# Patient Record
Sex: Female | Born: 1955
Health system: Southern US, Community
[De-identification: ages and names within clinical notes are randomized; demographics above are authoritative.]

## PROBLEM LIST (undated history)

## (undated) DIAGNOSIS — I071 Rheumatic tricuspid insufficiency: Secondary | ICD-10-CM

## (undated) DIAGNOSIS — F419 Anxiety disorder, unspecified: Secondary | ICD-10-CM

## (undated) DIAGNOSIS — M810 Age-related osteoporosis without current pathological fracture: Secondary | ICD-10-CM

## (undated) DIAGNOSIS — Z954 Presence of other heart-valve replacement: Secondary | ICD-10-CM

## (undated) DIAGNOSIS — K219 Gastro-esophageal reflux disease without esophagitis: Secondary | ICD-10-CM

## (undated) DIAGNOSIS — Z1211 Encounter for screening for malignant neoplasm of colon: Secondary | ICD-10-CM

## (undated) DIAGNOSIS — Z8679 Personal history of other diseases of the circulatory system: Secondary | ICD-10-CM

## (undated) DIAGNOSIS — F32A Depression, unspecified: Secondary | ICD-10-CM

## (undated) DIAGNOSIS — K589 Irritable bowel syndrome without diarrhea: Secondary | ICD-10-CM

## (undated) DIAGNOSIS — E785 Hyperlipidemia, unspecified: Secondary | ICD-10-CM

## (undated) DIAGNOSIS — F329 Major depressive disorder, single episode, unspecified: Secondary | ICD-10-CM

## (undated) DIAGNOSIS — I051 Rheumatic mitral insufficiency: Secondary | ICD-10-CM

## (undated) DIAGNOSIS — E119 Type 2 diabetes mellitus without complications: Secondary | ICD-10-CM

## (undated) DIAGNOSIS — Z7901 Long term (current) use of anticoagulants: Secondary | ICD-10-CM

## (undated) DIAGNOSIS — I050 Rheumatic mitral stenosis: Secondary | ICD-10-CM

## (undated) DIAGNOSIS — R195 Other fecal abnormalities: Secondary | ICD-10-CM

## (undated) DIAGNOSIS — I480 Paroxysmal atrial fibrillation: Secondary | ICD-10-CM

## (undated) DIAGNOSIS — I509 Heart failure, unspecified: Secondary | ICD-10-CM

## (undated) DIAGNOSIS — I099 Rheumatic heart disease, unspecified: Secondary | ICD-10-CM

## (undated) HISTORY — DX: Depression, unspecified: F32.A

## (undated) HISTORY — DX: Other fecal abnormalities: R19.5

## (undated) HISTORY — DX: Long term (current) use of anticoagulants: Z79.01

## (undated) HISTORY — DX: Age-related osteoporosis without current pathological fracture: M81.0

## (undated) HISTORY — DX: Encounter for screening for malignant neoplasm of colon: Z12.11

## (undated) HISTORY — DX: Anxiety disorder, unspecified: F41.9

## (undated) HISTORY — PX: NO PAST SURGERIES: SHX2092

## (undated) HISTORY — DX: Rheumatic heart disease, unspecified: I09.9

## (undated) HISTORY — DX: Hyperlipidemia, unspecified: E78.5

## (undated) HISTORY — DX: Rheumatic tricuspid insufficiency: I07.1

## (undated) HISTORY — DX: Type 2 diabetes mellitus without complications: E11.9

## (undated) HISTORY — DX: Irritable bowel syndrome, unspecified: K58.9

## (undated) HISTORY — PX: WISDOM TOOTH EXTRACTION: SHX21

---
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
## 1999-11-12 ENCOUNTER — Other Ambulatory Visit: Admission: RE | Admit: 1999-11-12 | Discharge: 1999-11-12 | Payer: Self-pay | Admitting: Obstetrics & Gynecology

## 2000-11-03 ENCOUNTER — Other Ambulatory Visit: Admission: RE | Admit: 2000-11-03 | Discharge: 2000-11-03 | Payer: Self-pay | Admitting: Obstetrics & Gynecology

## 2001-11-16 ENCOUNTER — Other Ambulatory Visit: Admission: RE | Admit: 2001-11-16 | Discharge: 2001-11-16 | Payer: Self-pay | Admitting: Obstetrics & Gynecology

## 2002-11-24 ENCOUNTER — Other Ambulatory Visit: Admission: RE | Admit: 2002-11-24 | Discharge: 2002-11-24 | Payer: Self-pay | Admitting: Obstetrics & Gynecology

## 2003-12-14 ENCOUNTER — Other Ambulatory Visit: Admission: RE | Admit: 2003-12-14 | Discharge: 2003-12-14 | Payer: Self-pay | Admitting: Obstetrics & Gynecology

## 2005-01-09 ENCOUNTER — Other Ambulatory Visit: Admission: RE | Admit: 2005-01-09 | Discharge: 2005-01-09 | Payer: Self-pay | Admitting: Obstetrics & Gynecology

## 2008-06-14 HISTORY — PX: COLONOSCOPY: SHX174

## 2011-05-18 HISTORY — PX: ESOPHAGOGASTRODUODENOSCOPY: SHX1529

## 2015-10-27 DIAGNOSIS — I509 Heart failure, unspecified: Secondary | ICD-10-CM | POA: Insufficient documentation

## 2015-10-27 HISTORY — DX: Heart failure, unspecified: I50.9

## 2016-01-20 DIAGNOSIS — Z7901 Long term (current) use of anticoagulants: Secondary | ICD-10-CM | POA: Insufficient documentation

## 2016-01-20 DIAGNOSIS — I051 Rheumatic mitral insufficiency: Secondary | ICD-10-CM

## 2016-01-20 DIAGNOSIS — I050 Rheumatic mitral stenosis: Secondary | ICD-10-CM

## 2016-01-20 DIAGNOSIS — I480 Paroxysmal atrial fibrillation: Secondary | ICD-10-CM

## 2016-01-20 DIAGNOSIS — I099 Rheumatic heart disease, unspecified: Secondary | ICD-10-CM

## 2016-01-20 HISTORY — DX: Rheumatic mitral stenosis: I05.0

## 2016-01-20 HISTORY — DX: Long term (current) use of anticoagulants: Z79.01

## 2016-01-20 HISTORY — DX: Paroxysmal atrial fibrillation: I48.0

## 2016-01-20 HISTORY — DX: Rheumatic mitral insufficiency: I05.1

## 2016-01-27 DIAGNOSIS — I4891 Unspecified atrial fibrillation: Secondary | ICD-10-CM | POA: Diagnosis not present

## 2016-02-04 DIAGNOSIS — I4891 Unspecified atrial fibrillation: Secondary | ICD-10-CM | POA: Diagnosis not present

## 2016-02-25 DIAGNOSIS — I4891 Unspecified atrial fibrillation: Secondary | ICD-10-CM | POA: Diagnosis not present

## 2016-02-25 DIAGNOSIS — Z7901 Long term (current) use of anticoagulants: Secondary | ICD-10-CM | POA: Diagnosis not present

## 2016-03-04 DIAGNOSIS — I4891 Unspecified atrial fibrillation: Secondary | ICD-10-CM | POA: Diagnosis not present

## 2016-03-18 DIAGNOSIS — I480 Paroxysmal atrial fibrillation: Secondary | ICD-10-CM | POA: Diagnosis not present

## 2016-03-18 DIAGNOSIS — Z7901 Long term (current) use of anticoagulants: Secondary | ICD-10-CM | POA: Diagnosis not present

## 2016-04-08 DIAGNOSIS — I099 Rheumatic heart disease, unspecified: Secondary | ICD-10-CM | POA: Diagnosis not present

## 2016-04-08 DIAGNOSIS — I480 Paroxysmal atrial fibrillation: Secondary | ICD-10-CM | POA: Diagnosis not present

## 2016-04-08 DIAGNOSIS — I050 Rheumatic mitral stenosis: Secondary | ICD-10-CM | POA: Diagnosis not present

## 2016-04-08 DIAGNOSIS — I051 Rheumatic mitral insufficiency: Secondary | ICD-10-CM | POA: Diagnosis not present

## 2016-04-08 DIAGNOSIS — I4891 Unspecified atrial fibrillation: Secondary | ICD-10-CM | POA: Diagnosis not present

## 2016-04-21 DIAGNOSIS — K219 Gastro-esophageal reflux disease without esophagitis: Secondary | ICD-10-CM | POA: Diagnosis not present

## 2016-04-22 DIAGNOSIS — I4891 Unspecified atrial fibrillation: Secondary | ICD-10-CM | POA: Diagnosis not present

## 2016-04-27 DIAGNOSIS — Z1231 Encounter for screening mammogram for malignant neoplasm of breast: Secondary | ICD-10-CM | POA: Diagnosis not present

## 2016-05-07 DIAGNOSIS — I4891 Unspecified atrial fibrillation: Secondary | ICD-10-CM | POA: Diagnosis not present

## 2016-05-12 DIAGNOSIS — Z124 Encounter for screening for malignant neoplasm of cervix: Secondary | ICD-10-CM | POA: Diagnosis not present

## 2016-05-12 DIAGNOSIS — Z6831 Body mass index (BMI) 31.0-31.9, adult: Secondary | ICD-10-CM | POA: Diagnosis not present

## 2016-05-12 DIAGNOSIS — Z01419 Encounter for gynecological examination (general) (routine) without abnormal findings: Secondary | ICD-10-CM | POA: Diagnosis not present

## 2016-05-28 DIAGNOSIS — I4891 Unspecified atrial fibrillation: Secondary | ICD-10-CM | POA: Diagnosis not present

## 2016-06-25 DIAGNOSIS — I480 Paroxysmal atrial fibrillation: Secondary | ICD-10-CM | POA: Diagnosis not present

## 2016-07-02 DIAGNOSIS — I4891 Unspecified atrial fibrillation: Secondary | ICD-10-CM | POA: Diagnosis not present

## 2016-07-14 DIAGNOSIS — I099 Rheumatic heart disease, unspecified: Secondary | ICD-10-CM | POA: Diagnosis not present

## 2016-07-14 DIAGNOSIS — I050 Rheumatic mitral stenosis: Secondary | ICD-10-CM | POA: Diagnosis not present

## 2016-07-14 DIAGNOSIS — I051 Rheumatic mitral insufficiency: Secondary | ICD-10-CM | POA: Diagnosis not present

## 2016-07-14 DIAGNOSIS — I480 Paroxysmal atrial fibrillation: Secondary | ICD-10-CM | POA: Diagnosis not present

## 2016-07-21 DIAGNOSIS — Z0000 Encounter for general adult medical examination without abnormal findings: Secondary | ICD-10-CM | POA: Diagnosis not present

## 2016-07-21 DIAGNOSIS — I4891 Unspecified atrial fibrillation: Secondary | ICD-10-CM | POA: Diagnosis not present

## 2016-07-21 DIAGNOSIS — I5030 Unspecified diastolic (congestive) heart failure: Secondary | ICD-10-CM | POA: Diagnosis not present

## 2016-07-21 DIAGNOSIS — I050 Rheumatic mitral stenosis: Secondary | ICD-10-CM | POA: Diagnosis not present

## 2016-08-04 DIAGNOSIS — I4891 Unspecified atrial fibrillation: Secondary | ICD-10-CM | POA: Diagnosis not present

## 2016-08-18 DIAGNOSIS — Z23 Encounter for immunization: Secondary | ICD-10-CM | POA: Diagnosis not present

## 2016-09-02 DIAGNOSIS — I4891 Unspecified atrial fibrillation: Secondary | ICD-10-CM | POA: Diagnosis not present

## 2016-09-16 DIAGNOSIS — Z7901 Long term (current) use of anticoagulants: Secondary | ICD-10-CM | POA: Diagnosis not present

## 2016-09-16 DIAGNOSIS — I480 Paroxysmal atrial fibrillation: Secondary | ICD-10-CM | POA: Diagnosis not present

## 2016-09-30 DIAGNOSIS — I050 Rheumatic mitral stenosis: Secondary | ICD-10-CM | POA: Diagnosis not present

## 2016-09-30 DIAGNOSIS — E119 Type 2 diabetes mellitus without complications: Secondary | ICD-10-CM | POA: Diagnosis not present

## 2016-09-30 DIAGNOSIS — I5030 Unspecified diastolic (congestive) heart failure: Secondary | ICD-10-CM | POA: Diagnosis not present

## 2016-09-30 DIAGNOSIS — I4891 Unspecified atrial fibrillation: Secondary | ICD-10-CM | POA: Diagnosis not present

## 2016-09-30 DIAGNOSIS — I480 Paroxysmal atrial fibrillation: Secondary | ICD-10-CM | POA: Diagnosis not present

## 2016-10-29 DIAGNOSIS — H5203 Hypermetropia, bilateral: Secondary | ICD-10-CM | POA: Diagnosis not present

## 2016-10-29 DIAGNOSIS — I480 Paroxysmal atrial fibrillation: Secondary | ICD-10-CM | POA: Diagnosis not present

## 2016-11-10 DIAGNOSIS — I4891 Unspecified atrial fibrillation: Secondary | ICD-10-CM | POA: Diagnosis not present

## 2016-12-09 DIAGNOSIS — I480 Paroxysmal atrial fibrillation: Secondary | ICD-10-CM | POA: Diagnosis not present

## 2016-12-24 DIAGNOSIS — I480 Paroxysmal atrial fibrillation: Secondary | ICD-10-CM | POA: Diagnosis not present

## 2017-01-07 DIAGNOSIS — I480 Paroxysmal atrial fibrillation: Secondary | ICD-10-CM | POA: Diagnosis not present

## 2017-01-29 DIAGNOSIS — E785 Hyperlipidemia, unspecified: Secondary | ICD-10-CM | POA: Diagnosis not present

## 2017-01-29 DIAGNOSIS — E1169 Type 2 diabetes mellitus with other specified complication: Secondary | ICD-10-CM | POA: Diagnosis not present

## 2017-01-29 DIAGNOSIS — G4733 Obstructive sleep apnea (adult) (pediatric): Secondary | ICD-10-CM | POA: Diagnosis not present

## 2017-01-29 DIAGNOSIS — I4891 Unspecified atrial fibrillation: Secondary | ICD-10-CM | POA: Diagnosis not present

## 2017-01-29 DIAGNOSIS — I5030 Unspecified diastolic (congestive) heart failure: Secondary | ICD-10-CM | POA: Diagnosis not present

## 2017-01-29 DIAGNOSIS — I050 Rheumatic mitral stenosis: Secondary | ICD-10-CM | POA: Diagnosis not present

## 2017-02-24 DIAGNOSIS — I480 Paroxysmal atrial fibrillation: Secondary | ICD-10-CM | POA: Diagnosis not present

## 2017-03-08 DIAGNOSIS — I4891 Unspecified atrial fibrillation: Secondary | ICD-10-CM | POA: Diagnosis not present

## 2017-03-09 DIAGNOSIS — J189 Pneumonia, unspecified organism: Secondary | ICD-10-CM | POA: Diagnosis not present

## 2017-03-09 DIAGNOSIS — Z6827 Body mass index (BMI) 27.0-27.9, adult: Secondary | ICD-10-CM | POA: Diagnosis not present

## 2017-03-11 DIAGNOSIS — Z7901 Long term (current) use of anticoagulants: Secondary | ICD-10-CM | POA: Diagnosis not present

## 2017-03-11 DIAGNOSIS — I480 Paroxysmal atrial fibrillation: Secondary | ICD-10-CM | POA: Diagnosis not present

## 2017-03-15 DIAGNOSIS — I482 Chronic atrial fibrillation: Secondary | ICD-10-CM | POA: Diagnosis not present

## 2017-03-29 DIAGNOSIS — I480 Paroxysmal atrial fibrillation: Secondary | ICD-10-CM | POA: Diagnosis not present

## 2017-03-29 DIAGNOSIS — Z7901 Long term (current) use of anticoagulants: Secondary | ICD-10-CM | POA: Diagnosis not present

## 2017-04-29 ENCOUNTER — Encounter: Payer: Self-pay | Admitting: Cardiology

## 2017-04-29 ENCOUNTER — Ambulatory Visit (INDEPENDENT_AMBULATORY_CARE_PROVIDER_SITE_OTHER): Payer: BLUE CROSS/BLUE SHIELD | Admitting: Cardiology

## 2017-04-29 ENCOUNTER — Encounter (INDEPENDENT_AMBULATORY_CARE_PROVIDER_SITE_OTHER): Payer: Self-pay

## 2017-04-29 VITALS — BP 120/56 | HR 74 | Resp 10 | Ht 64.0 in | Wt 154.0 lb

## 2017-04-29 DIAGNOSIS — Z7901 Long term (current) use of anticoagulants: Secondary | ICD-10-CM | POA: Diagnosis not present

## 2017-04-29 DIAGNOSIS — I099 Rheumatic heart disease, unspecified: Secondary | ICD-10-CM | POA: Insufficient documentation

## 2017-04-29 DIAGNOSIS — I480 Paroxysmal atrial fibrillation: Secondary | ICD-10-CM | POA: Diagnosis not present

## 2017-04-29 DIAGNOSIS — I050 Rheumatic mitral stenosis: Secondary | ICD-10-CM | POA: Insufficient documentation

## 2017-04-29 DIAGNOSIS — I051 Rheumatic mitral insufficiency: Secondary | ICD-10-CM | POA: Insufficient documentation

## 2017-04-29 NOTE — Progress Notes (Signed)
Cardiology Office Note:    Date:  04/29/2017   ID:  Melody Torres, DOB 05/30/1956, MRN [id number]  PCP:  Street, Stephanie Coup, MD  Cardiologist:  Gypsy Balsam, MD    Referring MD: Street, Stephanie Coup, *   Chief Complaint  Patient presents with  . Follow-up  Mitral stenosis secondary to rheumatic heart disease  History of Present Illness:    Melody Torres is a 61 y.o. female  with traumatic stenosis. Doing fine. She used to dense on the radial basis but lately because of some family members being sick she simply doesn't do it but denies having any shortness of breath, palpitations, chest pain, dizziness, passing of the overall she seems to be doing well.  Past Medical History:  Diagnosis Date  . Diabetes mellitus without complication (HCC)   . Hyperlipidemia     Past Surgical History:  Procedure Laterality Date  . NO PAST SURGERIES      Current Medications: Current Meds  Medication Sig  . digoxin (DIGOX) 0.25 MG tablet Take 1 tablet by mouth daily.  Marland Kitchen dronedarone (MULTAQ) 400 MG tablet Take 1 tablet by mouth 2 (two) times daily.  Marland Kitchen JANUVIA 100 MG tablet Take 1 tablet by mouth daily.  Marland Kitchen omeprazole (PRILOSEC) 20 MG capsule Take 20 mg by mouth daily.  . pravastatin (PRAVACHOL) 40 MG tablet Take 1 tablet by mouth daily with supper.  . warfarin (COUMADIN) 5 MG tablet TAKE 2 TABLETS BY MOUTH EVERY DAY     Allergies:   Penicillins   Social History   Social History  . Marital status: Married    Spouse name: N/A  . Number of children: N/A  . Years of education: N/A   Social History Main Topics  . Smoking status: Never Smoker  . Smokeless tobacco: Never Used  . Alcohol use No  . Drug use: No  . Sexual activity: Not Asked   Other Topics Concern  . None   Social History Narrative  . None     Family History: The patient's family history includes Bladder Cancer in her mother; Diabetes in her father; Heart attack in her mother; Heart disease in her  father; Hypothyroidism in her mother; Stroke in her father. ROS:   Please see the history of present illness.     All other systems reviewed and are negative.  EKGs/Labs/Other Studies Reviewed:      Recent Labs: No results found for requested labs within last 8760 hours.  Recent Lipid Panel No results found for: CHOL, TRIG, HDL, CHOLHDL, VLDL, LDLCALC, LDLDIRECT  Physical Exam:    VS:  BP (!) 120/56   Pulse 74   Resp 10   Ht 5\' 4"  (1.626 m)   Wt 154 lb (69.9 kg)   BMI 26.43 kg/m     Wt Readings from Last 3 Encounters:  04/29/17 154 lb (69.9 kg)     GEN:  Well nourished, well developed in no acute distress HEENT: Normal NECK: No JVD; No carotid bruits LYMPHATICS: No lymphadenopathy CARDIAC: RRR,Systolic and diastolic murmur O's., no rubs, no gallops RESPIRATORY:  Clear to auscultation without rales, wheezing or rhonchi  ABDOMEN: Soft, non-tender, non-distended MUSCULOSKELETAL:  No edema; No deformity  SKIN: Warm and dry LOWER EXTREMITIES: no swelling NEUROLOGIC:  Alert and oriented x 3 PSYCHIATRIC:  Normal affect   ASSESSMENT:    1. Mild mitral stenosis   2. Paroxysmal atrial fibrillation (HCC)   3. Rheumatic heart disease   4. Anticoagulation adequate with anticoagulant  therapy    PLAN:    In order of problems listed above:  1. Mitral stenosis: Stent to repeat her echocardiogram which we'll do. 2. Axis I atrial fibrillation, she is anticoagulant with Coumadin will check INR today. 3. Dyslipidemia: She is on pravastatin which I will continue with check INR today.   Medication Adjustments/Labs and Tests Ordered: Current medicines are reviewed at length with the patient today.  Concerns regarding medicines are outlined above.  No orders of the defined types were placed in this encounter.  Medication changes: No orders of the defined types were placed in this encounter.   Signed, Gypsy Balsamobert Krasowski, MD  04/29/2017 4:48 PM    [facility]

## 2017-04-29 NOTE — Patient Instructions (Signed)
Medication Instructions:  Your physician recommends that you continue on your current medications as directed. Please refer to the Current Medication list given to you today.  Labwork: Your physician recommends that you return for lab work in:Pt/INR   Testing/Procedures: Echo   Follow-Up: Your physician recommends that you schedule a follow-up appointment in: 6 months   Any Other Special Instructions Will Be Listed Below (If Applicable).     If you need a refill on your cardiac medications before your next appointment, please call your pharmacy.

## 2017-04-30 ENCOUNTER — Ambulatory Visit (INDEPENDENT_AMBULATORY_CARE_PROVIDER_SITE_OTHER): Payer: BLUE CROSS/BLUE SHIELD | Admitting: Pharmacist

## 2017-04-30 DIAGNOSIS — Z5181 Encounter for therapeutic drug level monitoring: Secondary | ICD-10-CM | POA: Insufficient documentation

## 2017-04-30 DIAGNOSIS — Z7901 Long term (current) use of anticoagulants: Secondary | ICD-10-CM

## 2017-04-30 DIAGNOSIS — I099 Rheumatic heart disease, unspecified: Secondary | ICD-10-CM

## 2017-04-30 DIAGNOSIS — I480 Paroxysmal atrial fibrillation: Secondary | ICD-10-CM

## 2017-04-30 HISTORY — DX: Encounter for therapeutic drug level monitoring: Z51.81

## 2017-04-30 HISTORY — DX: Long term (current) use of anticoagulants: Z79.01

## 2017-04-30 LAB — LIPID PANEL
CHOLESTEROL TOTAL: 140 mg/dL (ref 100–199)
Chol/HDL Ratio: 3.8 ratio (ref 0.0–4.4)
HDL: 37 mg/dL — ABNORMAL LOW (ref >39)
LDL Calculated: 57 mg/dL (ref 0–99)
Triglycerides: 232 mg/dL — ABNORMAL HIGH (ref 0–149)
VLDL CHOLESTEROL CAL: 46 mg/dL — AB (ref 5–40)

## 2017-04-30 LAB — PROTIME-INR
INR: 2 — AB (ref 0.8–1.2)
PROTHROMBIN TIME: 19.9 s — AB (ref 9.1–12.0)

## 2017-05-19 ENCOUNTER — Telehealth: Payer: Self-pay | Admitting: Pharmacist

## 2017-05-19 NOTE — Telephone Encounter (Signed)
LMTCB to schedule INR check on 7/30 in [place] office.

## 2017-05-20 NOTE — Telephone Encounter (Signed)
[facility]

## 2017-05-31 ENCOUNTER — Ambulatory Visit (INDEPENDENT_AMBULATORY_CARE_PROVIDER_SITE_OTHER): Payer: BLUE CROSS/BLUE SHIELD | Admitting: Pharmacist

## 2017-05-31 ENCOUNTER — Ambulatory Visit: Payer: BLUE CROSS/BLUE SHIELD

## 2017-05-31 DIAGNOSIS — Z7901 Long term (current) use of anticoagulants: Secondary | ICD-10-CM | POA: Diagnosis not present

## 2017-05-31 DIAGNOSIS — I099 Rheumatic heart disease, unspecified: Secondary | ICD-10-CM | POA: Diagnosis not present

## 2017-05-31 DIAGNOSIS — I480 Paroxysmal atrial fibrillation: Secondary | ICD-10-CM | POA: Diagnosis not present

## 2017-05-31 DIAGNOSIS — Z5181 Encounter for therapeutic drug level monitoring: Secondary | ICD-10-CM

## 2017-05-31 LAB — POCT INR: INR: 2.4

## 2017-06-21 ENCOUNTER — Telehealth: Payer: Self-pay | Admitting: Cardiology

## 2017-06-21 NOTE — Telephone Encounter (Signed)
Spoke with IT sales professional - they are no longer able to get same manufacturer of warfarin. OK to change. Note added to upcoming visit for INR check.

## 2017-06-21 NOTE — Telephone Encounter (Signed)
Has questions about switching her brand of warfrin

## 2017-07-03 DIAGNOSIS — Z1231 Encounter for screening mammogram for malignant neoplasm of breast: Secondary | ICD-10-CM | POA: Diagnosis not present

## 2017-07-05 ENCOUNTER — Ambulatory Visit (INDEPENDENT_AMBULATORY_CARE_PROVIDER_SITE_OTHER): Payer: BLUE CROSS/BLUE SHIELD | Admitting: Pharmacist

## 2017-07-05 DIAGNOSIS — I099 Rheumatic heart disease, unspecified: Secondary | ICD-10-CM | POA: Diagnosis not present

## 2017-07-05 DIAGNOSIS — Z5181 Encounter for therapeutic drug level monitoring: Secondary | ICD-10-CM

## 2017-07-05 DIAGNOSIS — Z7901 Long term (current) use of anticoagulants: Secondary | ICD-10-CM

## 2017-07-05 DIAGNOSIS — I480 Paroxysmal atrial fibrillation: Secondary | ICD-10-CM

## 2017-07-05 LAB — POCT INR: INR: 2.3

## 2017-08-06 DIAGNOSIS — E1169 Type 2 diabetes mellitus with other specified complication: Secondary | ICD-10-CM | POA: Diagnosis not present

## 2017-08-06 DIAGNOSIS — Z0000 Encounter for general adult medical examination without abnormal findings: Secondary | ICD-10-CM | POA: Diagnosis not present

## 2017-08-06 DIAGNOSIS — Z23 Encounter for immunization: Secondary | ICD-10-CM | POA: Diagnosis not present

## 2017-08-06 DIAGNOSIS — E785 Hyperlipidemia, unspecified: Secondary | ICD-10-CM | POA: Diagnosis not present

## 2017-08-06 DIAGNOSIS — E663 Overweight: Secondary | ICD-10-CM | POA: Diagnosis not present

## 2017-08-16 ENCOUNTER — Ambulatory Visit (INDEPENDENT_AMBULATORY_CARE_PROVIDER_SITE_OTHER): Payer: BLUE CROSS/BLUE SHIELD | Admitting: Cardiology

## 2017-08-16 ENCOUNTER — Encounter (INDEPENDENT_AMBULATORY_CARE_PROVIDER_SITE_OTHER): Payer: BLUE CROSS/BLUE SHIELD | Admitting: *Deleted

## 2017-08-16 DIAGNOSIS — Z5181 Encounter for therapeutic drug level monitoring: Secondary | ICD-10-CM

## 2017-08-16 DIAGNOSIS — I480 Paroxysmal atrial fibrillation: Secondary | ICD-10-CM

## 2017-08-16 DIAGNOSIS — Z7901 Long term (current) use of anticoagulants: Secondary | ICD-10-CM

## 2017-08-16 DIAGNOSIS — I099 Rheumatic heart disease, unspecified: Secondary | ICD-10-CM

## 2017-08-16 LAB — POCT INR: INR: 2.5

## 2017-08-17 DIAGNOSIS — Z23 Encounter for immunization: Secondary | ICD-10-CM | POA: Diagnosis not present

## 2017-08-17 DIAGNOSIS — Z6826 Body mass index (BMI) 26.0-26.9, adult: Secondary | ICD-10-CM | POA: Diagnosis not present

## 2017-08-17 DIAGNOSIS — Z01419 Encounter for gynecological examination (general) (routine) without abnormal findings: Secondary | ICD-10-CM | POA: Diagnosis not present

## 2017-08-23 ENCOUNTER — Other Ambulatory Visit: Payer: Self-pay

## 2017-08-23 MED ORDER — PRAVASTATIN SODIUM 40 MG PO TABS: 40.0000 mg | ORAL_TABLET | Freq: Every day | ORAL | 2 refills | Status: DC

## 2017-08-23 MED ORDER — DRONEDARONE HCL 400 MG PO TABS: 400.0000 mg | ORAL_TABLET | Freq: Two times a day (BID) | ORAL | 2 refills | Status: DC

## 2017-08-30 ENCOUNTER — Other Ambulatory Visit: Payer: Self-pay

## 2017-08-30 MED ORDER — DIGOXIN 250 MCG PO TABS: 250.0000 ug | ORAL_TABLET | Freq: Every day | ORAL | 6 refills | Status: DC

## 2017-09-27 ENCOUNTER — Ambulatory Visit (INDEPENDENT_AMBULATORY_CARE_PROVIDER_SITE_OTHER): Payer: BLUE CROSS/BLUE SHIELD | Admitting: *Deleted

## 2017-09-27 DIAGNOSIS — I480 Paroxysmal atrial fibrillation: Secondary | ICD-10-CM | POA: Diagnosis not present

## 2017-09-27 DIAGNOSIS — Z5181 Encounter for therapeutic drug level monitoring: Secondary | ICD-10-CM

## 2017-09-27 DIAGNOSIS — I099 Rheumatic heart disease, unspecified: Secondary | ICD-10-CM

## 2017-09-27 DIAGNOSIS — Z7901 Long term (current) use of anticoagulants: Secondary | ICD-10-CM

## 2017-09-27 LAB — POCT INR: INR: 2.3

## 2017-09-27 NOTE — Patient Instructions (Signed)
Continue taking 5mg  each Thursday and 7.5mg  all other days. Recheck INR in 6 weeks.

## 2017-10-25 ENCOUNTER — Other Ambulatory Visit: Payer: Self-pay | Admitting: *Deleted

## 2017-10-25 ENCOUNTER — Other Ambulatory Visit: Payer: Self-pay

## 2017-10-25 MED ORDER — WARFARIN SODIUM 5 MG PO TABS: ORAL_TABLET | ORAL | 3 refills | Status: DC

## 2017-10-27 ENCOUNTER — Telehealth: Payer: Self-pay | Admitting: Cardiology

## 2017-10-27 NOTE — Telephone Encounter (Signed)
Patient had a recall for echo and follow up appointment and it states she is due for echo, [facility] .

## 2017-10-27 NOTE — Telephone Encounter (Signed)
Patient advised that [facility] is [facility] for 10/29/17 at 9:30 am, patient to be there at 9 am. Patient verbalized understanding. No further questions.

## 2017-10-27 NOTE — Telephone Encounter (Signed)
Advised patient test may be performed at [facility]. Advised patient we would return call once approved by insurance and scheduled.

## 2017-10-29 ENCOUNTER — Encounter: Payer: Self-pay | Admitting: Cardiology

## 2017-10-29 DIAGNOSIS — I071 Rheumatic tricuspid insufficiency: Secondary | ICD-10-CM | POA: Diagnosis not present

## 2017-10-29 DIAGNOSIS — I517 Cardiomegaly: Secondary | ICD-10-CM | POA: Diagnosis not present

## 2017-10-29 DIAGNOSIS — I050 Rheumatic mitral stenosis: Secondary | ICD-10-CM | POA: Diagnosis not present

## 2017-11-01 DIAGNOSIS — L578 Other skin changes due to chronic exposure to nonionizing radiation: Secondary | ICD-10-CM | POA: Diagnosis not present

## 2017-11-01 DIAGNOSIS — L820 Inflamed seborrheic keratosis: Secondary | ICD-10-CM | POA: Diagnosis not present

## 2017-11-01 DIAGNOSIS — L821 Other seborrheic keratosis: Secondary | ICD-10-CM | POA: Diagnosis not present

## 2017-11-08 ENCOUNTER — Ambulatory Visit (INDEPENDENT_AMBULATORY_CARE_PROVIDER_SITE_OTHER): Payer: Self-pay | Admitting: *Deleted

## 2017-11-08 ENCOUNTER — Ambulatory Visit (INDEPENDENT_AMBULATORY_CARE_PROVIDER_SITE_OTHER): Payer: BLUE CROSS/BLUE SHIELD | Admitting: Cardiology

## 2017-11-08 ENCOUNTER — Other Ambulatory Visit: Payer: Self-pay

## 2017-11-08 ENCOUNTER — Encounter: Payer: Self-pay | Admitting: Cardiology

## 2017-11-08 VITALS — BP 118/58 | HR 59 | Ht 64.0 in | Wt 155.1 lb

## 2017-11-08 DIAGNOSIS — I099 Rheumatic heart disease, unspecified: Secondary | ICD-10-CM | POA: Diagnosis not present

## 2017-11-08 DIAGNOSIS — I480 Paroxysmal atrial fibrillation: Secondary | ICD-10-CM

## 2017-11-08 DIAGNOSIS — Z7901 Long term (current) use of anticoagulants: Secondary | ICD-10-CM

## 2017-11-08 DIAGNOSIS — I050 Rheumatic mitral stenosis: Secondary | ICD-10-CM

## 2017-11-08 DIAGNOSIS — Z5181 Encounter for therapeutic drug level monitoring: Secondary | ICD-10-CM

## 2017-11-08 DIAGNOSIS — I350 Nonrheumatic aortic (valve) stenosis: Secondary | ICD-10-CM

## 2017-11-08 LAB — POCT INR: INR: 2

## 2017-11-08 NOTE — Patient Instructions (Signed)
Medication Instructions:  Your physician recommends that you continue on your current medications as directed. Please refer to the Current Medication list given to you today.  Labwork: None ordered  Testing/Procedures: None ordered  Follow-Up: Your physician recommends that you schedule a follow-up appointment in: 3 months with Dr. Krasowski   Any Other Special Instructions Will Be Listed Below (If Applicable).     If you need a refill on your cardiac medications before your next appointment, please call your pharmacy.   

## 2017-11-08 NOTE — Progress Notes (Signed)
Cardiology Office Note:    Date:  11/08/2017   ID:  Melody Torres, DOB 10/20/1956, MRN [id number]  PCP:  Street, [name], MD  Cardiologist:  Gypsy Balsamobert Manaal Mandala, MD    Referring MD: Street, [name], *   Chief Complaint  Patient presents with  . Follow-up    6 month  Doing well but had one episode of palpitations  History of Present Illness:    Melody Bakerlaine S Muma is a 62 y.o. female with mitral stenosis which recent echocardiogram estimated as moderate, she is asymptomatic in terms of she does have some exertional shortness of breath.  She does not exercise much but she goes to the line dancing twice a week she gets short of breath while she does not but this is something that she had all the time.  All there is no worsening of shortness of breath with exercise.  She described one episode of palpitation lasting for about half an hour what happened a few weeks ago.  Otherwise well denies having any proximal nocturnal dyspnea no swelling of lower extremities.  No shortness of breath with usual activities.  Past Medical History:  Diagnosis Date  . Diabetes mellitus without complication (HCC)   . Hyperlipidemia       Current Medications: Current Meds  Medication Sig  . digoxin (DIGOX) 0.25 MG tablet Take 1 tablet (250 mcg total) daily by mouth.  . dronedarone (MULTAQ) 400 MG tablet Take 1 tablet (400 mg total) by mouth 2 (two) times daily.  Marland Kitchen. JANUVIA 100 MG tablet Take 1 tablet by mouth daily.  . pravastatin (PRAVACHOL) 40 MG tablet Take 1 tablet (40 mg total) by mouth daily with supper.  . warfarin (COUMADIN) 5 MG tablet Take As Directed by [facility]     Allergies:   Penicillins   Social History   Socioeconomic History  . Marital status: Married    Spouse name: Not on file  . Number of children: Not on file  . Years of education: Not on file  . Highest education level: Not on file  Social Needs  . Financial resource strain: Not on file  . Food  insecurity - worry: Not on file  . Food insecurity - inability: Not on file  . Transportation needs - medical: Not on file  . Transportation needs - non-medical: Not on file  Occupational History  . Not on file  Tobacco Use  . Smoking status: Never Smoker  . Smokeless tobacco: Never Used  Substance and Sexual Activity  . Alcohol use: No  . Drug use: No  . Sexual activity: Not on file  Other Topics Concern  . Not on file  Social History Narrative  . Not on file     Family History: The patient's family history includes Bladder Cancer in her mother; Diabetes in her father; Heart attack in her mother; Heart disease in her father; Hypothyroidism in her mother; Stroke in her father. ROS:   Please see the history of present illness.    All 14 point review of systems negative except as described per history of present illness  EKGs/Labs/Other Studies Reviewed:      Recent Labs: No results found for requested labs within last 8760 hours.  Recent Lipid Panel    Component Value Date/Time   CHOL 140 04/29/2017 1656   TRIG 232 (H) 04/29/2017 1656   HDL 37 (L) 04/29/2017 1656   CHOLHDL 3.8 04/29/2017 1656   LDLCALC 57 04/29/2017 1656    Physical  Exam:    VS:  BP (!) 118/58 (BP Location: Left Arm, Patient Position: Sitting, Cuff Size: Normal)   Pulse (!) 59   Ht 5\' 4"  (1.626 m)   Wt 155 lb 1.9 oz (70.4 kg)   SpO2 98%   BMI 26.63 kg/m     Wt Readings from Last 3 Encounters:  11/08/17 155 lb 1.9 oz (70.4 kg)  04/29/17 154 lb (69.9 kg)     GEN:  Well nourished, well developed in no acute distress HEENT: Normal NECK: No JVD; No carotid bruits LYMPHATICS: No lymphadenopathy CARDIAC: RRR, systolic murmur grade 1/6 to 2/6 best heard at the right upper portion of the sternum: There is also very low pitched diastolic rumble., no rubs, no gallops RESPIRATORY:  Clear to auscultation without rales, wheezing or rhonchi  ABDOMEN: Soft, non-tender, non-distended MUSCULOSKELETAL:  No  edema; No deformity  SKIN: Warm and dry LOWER EXTREMITIES: no swelling NEUROLOGIC:  Alert and oriented x 3 PSYCHIATRIC:  Normal affect   ASSESSMENT:    1. Paroxysmal atrial fibrillation (HCC)   2. Moderate mitral stenosis   3. Mild aortic stenosis   4. Rheumatic heart disease   5. Anticoagulation adequate with anticoagulant therapy    PLAN:    In order of problems listed above:  1. Paroxysmal atrial fibrillation: Anticoagulated with Coumadin which I will continue: She had one episode of left atrial fibrillation lasting 30 minutes.  We will continue with Multaq and anticoagulation 2. Moderate mitral stenosis: Her left ventricle size is normal her ejection fraction is normal.  I will try to get a copy of her echocardiogram from hospital to check for as of the valve regarding potential balloon valvuloplasty but since she is asymptomatic with normal LV size and function I think will be simply watching this. 3. Mild aortic stenosis: Does not required any intervention. 4. Coagulation: We will continue present management.   Medication Adjustments/Labs and Tests Ordered: Current medicines are reviewed at length with the patient today.  Concerns regarding medicines are outlined above.  No orders of the defined types were placed in this encounter.  Medication changes: No orders of the defined types were placed in this encounter.   Signed, Georgeanna Lea, MD, [facility] 11/08/2017 11:50 AM    [facility]

## 2017-11-08 NOTE — Patient Instructions (Signed)
Description   Today take 10mg  then continue taking 5mg  each Thursday and 7.5mg  all other days. Recheck INR in 6 weeks.

## 2017-11-24 ENCOUNTER — Telehealth: Payer: Self-pay

## 2017-11-24 MED ORDER — WARFARIN SODIUM 5 MG PO TABS: ORAL_TABLET | ORAL | 3 refills | Status: DC

## 2017-11-24 NOTE — Telephone Encounter (Signed)
Refill sent to pharmacy as requested

## 2017-11-25 ENCOUNTER — Telehealth: Payer: Self-pay | Admitting: Cardiology

## 2017-11-25 MED ORDER — WARFARIN SODIUM 5 MG PO TABS: ORAL_TABLET | ORAL | 1 refills | Status: DC

## 2017-11-25 NOTE — Telephone Encounter (Signed)
Wants warfin #[phone] called [place] [facility]

## 2017-11-25 NOTE — Telephone Encounter (Signed)
Sent in Rx for 90 day supply of Warfarin

## 2017-12-10 DIAGNOSIS — Z6826 Body mass index (BMI) 26.0-26.9, adult: Secondary | ICD-10-CM | POA: Diagnosis not present

## 2017-12-10 DIAGNOSIS — B9789 Other viral agents as the cause of diseases classified elsewhere: Secondary | ICD-10-CM | POA: Diagnosis not present

## 2017-12-10 DIAGNOSIS — J069 Acute upper respiratory infection, unspecified: Secondary | ICD-10-CM | POA: Diagnosis not present

## 2017-12-20 ENCOUNTER — Ambulatory Visit (INDEPENDENT_AMBULATORY_CARE_PROVIDER_SITE_OTHER): Payer: BLUE CROSS/BLUE SHIELD | Admitting: Pharmacist

## 2017-12-20 DIAGNOSIS — I480 Paroxysmal atrial fibrillation: Secondary | ICD-10-CM | POA: Diagnosis not present

## 2017-12-20 DIAGNOSIS — I099 Rheumatic heart disease, unspecified: Secondary | ICD-10-CM | POA: Diagnosis not present

## 2017-12-20 DIAGNOSIS — Z7901 Long term (current) use of anticoagulants: Secondary | ICD-10-CM | POA: Diagnosis not present

## 2017-12-20 DIAGNOSIS — Z5181 Encounter for therapeutic drug level monitoring: Secondary | ICD-10-CM

## 2017-12-20 LAB — POCT INR: INR: 1.9

## 2017-12-20 NOTE — Patient Instructions (Signed)
Description   Today take 10 mg then continue taking 5 mg each Thursday and 7.5 mg all other days. Recheck INR in 4 weeks. Please be sure to be consistent with your weekly consumption of green's.

## 2018-01-17 ENCOUNTER — Ambulatory Visit (INDEPENDENT_AMBULATORY_CARE_PROVIDER_SITE_OTHER): Payer: BLUE CROSS/BLUE SHIELD

## 2018-01-17 DIAGNOSIS — I099 Rheumatic heart disease, unspecified: Secondary | ICD-10-CM | POA: Diagnosis not present

## 2018-01-17 DIAGNOSIS — Z7901 Long term (current) use of anticoagulants: Secondary | ICD-10-CM | POA: Diagnosis not present

## 2018-01-17 DIAGNOSIS — Z5181 Encounter for therapeutic drug level monitoring: Secondary | ICD-10-CM | POA: Diagnosis not present

## 2018-01-17 DIAGNOSIS — I480 Paroxysmal atrial fibrillation: Secondary | ICD-10-CM

## 2018-01-17 LAB — POCT INR: INR: 1.9

## 2018-01-17 NOTE — Patient Instructions (Signed)
Description   Today take 10 mg today and then take 7.5 mg every day. Recheck INR in 2 weeks. Please be sure to be consistent with your weekly consumption of green's.

## 2018-01-31 ENCOUNTER — Ambulatory Visit (INDEPENDENT_AMBULATORY_CARE_PROVIDER_SITE_OTHER): Payer: BLUE CROSS/BLUE SHIELD

## 2018-01-31 ENCOUNTER — Ambulatory Visit (INDEPENDENT_AMBULATORY_CARE_PROVIDER_SITE_OTHER): Payer: BLUE CROSS/BLUE SHIELD | Admitting: Cardiology

## 2018-01-31 ENCOUNTER — Encounter: Payer: Self-pay | Admitting: Cardiology

## 2018-01-31 VITALS — BP 114/58 | HR 54 | Ht 64.0 in | Wt 151.4 lb

## 2018-01-31 DIAGNOSIS — I050 Rheumatic mitral stenosis: Secondary | ICD-10-CM

## 2018-01-31 DIAGNOSIS — I099 Rheumatic heart disease, unspecified: Secondary | ICD-10-CM

## 2018-01-31 DIAGNOSIS — I480 Paroxysmal atrial fibrillation: Secondary | ICD-10-CM

## 2018-01-31 DIAGNOSIS — Z7901 Long term (current) use of anticoagulants: Secondary | ICD-10-CM | POA: Diagnosis not present

## 2018-01-31 DIAGNOSIS — I350 Nonrheumatic aortic (valve) stenosis: Secondary | ICD-10-CM

## 2018-01-31 DIAGNOSIS — Z5181 Encounter for therapeutic drug level monitoring: Secondary | ICD-10-CM

## 2018-01-31 LAB — POCT INR: INR: 2.1

## 2018-01-31 NOTE — Patient Instructions (Signed)
Medication Instructions:  Your physician recommends that you continue on your current medications as directed. Please refer to the Current Medication list given to you today.  Labwork: None ordered  Testing/Procedures: None ordered  Follow-Up: Your physician recommends that you schedule a follow-up appointment in: 4 months with Dr. Bing Matterkrasowski   Any Other Special Instructions Will Be Listed Below (If Applicable).     If you need a refill on your cardiac medications before your next appointment, please call your pharmacy.

## 2018-01-31 NOTE — Patient Instructions (Signed)
Description   Continue dose of 7.5 mg every day. Recheck INR in 4 weeks. Please be sure to keep your weekly intake of potassium rich vegetables consistent weekly. Call the office with any medication changes or additions [phone].

## 2018-01-31 NOTE — Progress Notes (Signed)
Cardiology Office Note:    Date:  01/31/2018   ID:  Melody Torres, DOB Sep 02, 1956, MRN [id number]  PCP:  Street, [name], MD  Cardiologist:  Gypsy Balsamobert Krasowski, MD    Referring MD: Street, [name], *   Chief Complaint  Patient presents with  . Follow-up  Doing well  History of Present Illness:    Melody Torres is a 62 y.o. female with moderate mitral stenosis as well as mild aortic stenosis proximal mitral fibrillation comes to our office for regular follow-up.  She is doing well very active she is still going to line dancing at least twice to 3 times a week have no difficulty doing it.  He said nothing change in terms of her ability to exercise.  Denies having any recent palpitations.  No swelling of lower extremities.  Overall she is doing well.  We discussed option in this situation option being potentially balloon valvuloplasty but since she is doing well she prefers to wait.  I think we can repeat another echocardiogram in about 6 months.  Also warned her if he develops new symptoms of worsening shortness of breath palpitation she need to let me know.  Past Medical History:  Diagnosis Date  . Diabetes mellitus without complication (HCC)   . Hyperlipidemia       Current Medications: Current Meds  Medication Sig  . digoxin (DIGOX) 0.25 MG tablet Take 1 tablet (250 mcg total) daily by mouth.  . dronedarone (MULTAQ) 400 MG tablet Take 1 tablet (400 mg total) by mouth 2 (two) times daily.  Marland Kitchen. JANUVIA 100 MG tablet Take 1 tablet by mouth daily.  . pravastatin (PRAVACHOL) 40 MG tablet Take 1 tablet (40 mg total) by mouth daily with supper.  . warfarin (COUMADIN) 5 MG tablet Take As Directed by Coumadin Clinic     Allergies:   Penicillins   Social History   Socioeconomic History  . Marital status: Married    Spouse name: Not on file  . Number of children: Not on file  . Years of education: Not on file  . Highest education level: Not on file  Occupational  History  . Not on file  Social Needs  . Financial resource strain: Not on file  . Food insecurity:    Worry: Not on file    Inability: Not on file  . Transportation needs:    Medical: Not on file    Non-medical: Not on file  Tobacco Use  . Smoking status: Never Smoker  . Smokeless tobacco: Never Used  Substance and Sexual Activity  . Alcohol use: No  . Drug use: No  . Sexual activity: Not on file  Lifestyle  . Physical activity:    Days per week: Not on file    Minutes per session: Not on file  . Stress: Not on file  Relationships  . Social connections:    Talks on phone: Not on file    Gets together: Not on file    Attends religious service: Not on file    Active member of club or organization: Not on file    Attends meetings of clubs or organizations: Not on file    Relationship status: Not on file  Other Topics Concern  . Not on file  Social History Narrative  . Not on file     Family History: The patient's family history includes Bladder Cancer in her mother; Diabetes in her father; Heart attack in her mother; Heart disease in her  father; Hypothyroidism in her mother; Stroke in her father. ROS:   Please see the history of present illness.    All 14 point review of systems negative except as described per history of present illness  EKGs/Labs/Other Studies Reviewed:      Recent Labs: No results found for requested labs within last 8760 hours.  Recent Lipid Panel    Component Value Date/Time   CHOL 140 04/29/2017 1656   TRIG 232 (H) 04/29/2017 1656   HDL 37 (L) 04/29/2017 1656   CHOLHDL 3.8 04/29/2017 1656   LDLCALC 57 04/29/2017 1656    Physical Exam:    VS:  BP (!) 114/58   Pulse (!) 54   Ht 5\' 4"  (1.626 m)   Wt 151 lb 6.4 oz (68.7 kg)   SpO2 98%   BMI 25.99 kg/m     Wt Readings from Last 3 Encounters:  01/31/18 151 lb 6.4 oz (68.7 kg)  11/08/17 155 lb 1.9 oz (70.4 kg)  04/29/17 154 lb (69.9 kg)     GEN:  Well nourished, well developed in  no acute distress HEENT: Normal NECK: No JVD; No carotid bruits LYMPHATICS: No lymphadenopathy CARDIAC: RRR, systolic ejection murmur best heard at the right upper portion of the sternum grade 1/6 to 2/6 mother was also low pitched diastolic rumble best heard left border of the sternum, no rubs, no gallops RESPIRATORY:  Clear to auscultation without rales, wheezing or rhonchi  ABDOMEN: Soft, non-tender, non-distended MUSCULOSKELETAL:  No edema; No deformity  SKIN: Warm and dry LOWER EXTREMITIES: no swelling NEUROLOGIC:  Alert and oriented x 3 PSYCHIATRIC:  Normal affect   ASSESSMENT:    1. Mild aortic stenosis   2. Moderate mitral stenosis   3. Paroxysmal atrial fibrillation (HCC)   4. Long term (current) use of anticoagulants [Z79.01]    PLAN:    In order of problems listed above:  1. Mild aortic stenosis: Does not required any intervention. 2. Moderate mitral stenosis we elected to simply watching her since she is doing very well clinically pulmonary artery pressure is only 41 mmHg.  I will repeat echocardiogram in the summer.  He also will let me know if she will start having some new symptoms. 3. Mitral fibrillation denies having any anticoagulated which I will continue   Medication Adjustments/Labs and Tests Ordered: Current medicines are reviewed at length with the patient today.  Concerns regarding medicines are outlined above.  No orders of the defined types were placed in this encounter.  Medication changes: No orders of the defined types were placed in this encounter.   Signed, Georgeanna Lea, MD, [facility] 01/31/2018 12:14 PM    [facility]

## 2018-02-22 ENCOUNTER — Other Ambulatory Visit: Payer: Self-pay

## 2018-02-22 MED ORDER — PRAVASTATIN SODIUM 40 MG PO TABS: 40.0000 mg | ORAL_TABLET | Freq: Every day | ORAL | 2 refills | Status: DC

## 2018-02-25 ENCOUNTER — Ambulatory Visit (INDEPENDENT_AMBULATORY_CARE_PROVIDER_SITE_OTHER): Payer: BLUE CROSS/BLUE SHIELD

## 2018-02-25 DIAGNOSIS — I099 Rheumatic heart disease, unspecified: Secondary | ICD-10-CM | POA: Diagnosis not present

## 2018-02-25 DIAGNOSIS — I480 Paroxysmal atrial fibrillation: Secondary | ICD-10-CM | POA: Diagnosis not present

## 2018-02-25 DIAGNOSIS — Z5181 Encounter for therapeutic drug level monitoring: Secondary | ICD-10-CM

## 2018-02-25 DIAGNOSIS — Z7901 Long term (current) use of anticoagulants: Secondary | ICD-10-CM

## 2018-02-25 LAB — POCT INR: INR: 1.8

## 2018-02-25 NOTE — Patient Instructions (Signed)
Description   Please take 10 mg tonight then continue dose of 7.5 mg every day. Recheck INR in 2 weeks. Please be sure to keep your weekly intake of potassium rich vegetables consistent weekly. Call the office with any medication changes or additions [phone].

## 2018-02-28 ENCOUNTER — Other Ambulatory Visit: Payer: Self-pay

## 2018-02-28 MED ORDER — DIGOXIN 250 MCG PO TABS: 250.0000 ug | ORAL_TABLET | Freq: Every day | ORAL | 6 refills | Status: DC

## 2018-03-14 ENCOUNTER — Ambulatory Visit (INDEPENDENT_AMBULATORY_CARE_PROVIDER_SITE_OTHER): Payer: BLUE CROSS/BLUE SHIELD

## 2018-03-14 DIAGNOSIS — Z5181 Encounter for therapeutic drug level monitoring: Secondary | ICD-10-CM

## 2018-03-14 DIAGNOSIS — I480 Paroxysmal atrial fibrillation: Secondary | ICD-10-CM | POA: Diagnosis not present

## 2018-03-14 DIAGNOSIS — I099 Rheumatic heart disease, unspecified: Secondary | ICD-10-CM

## 2018-03-14 DIAGNOSIS — Z7901 Long term (current) use of anticoagulants: Secondary | ICD-10-CM

## 2018-03-14 LAB — POCT INR: INR: 2.3 (ref 2.0–3.0)

## 2018-03-14 NOTE — Patient Instructions (Signed)
Description   Continue dose of 7.5 mg every day. Recheck INR in 3 weeks. Please be sure to keep your weekly intake of potassium rich vegetables consistent weekly. Call the office with any medication changes or additions [phone].

## 2018-04-05 ENCOUNTER — Ambulatory Visit (INDEPENDENT_AMBULATORY_CARE_PROVIDER_SITE_OTHER): Payer: BLUE CROSS/BLUE SHIELD

## 2018-04-05 DIAGNOSIS — I099 Rheumatic heart disease, unspecified: Secondary | ICD-10-CM | POA: Diagnosis not present

## 2018-04-05 DIAGNOSIS — Z7901 Long term (current) use of anticoagulants: Secondary | ICD-10-CM

## 2018-04-05 DIAGNOSIS — Z5181 Encounter for therapeutic drug level monitoring: Secondary | ICD-10-CM

## 2018-04-05 DIAGNOSIS — I480 Paroxysmal atrial fibrillation: Secondary | ICD-10-CM

## 2018-04-05 LAB — POCT INR: INR: 2.3 (ref 2.0–3.0)

## 2018-04-05 NOTE — Patient Instructions (Signed)
Description   Continue dose of 7.5 mg every day. Recheck INR in 4 weeks. Please be sure to keep your weekly intake of potassium rich vegetables consistent weekly. Call the office with any medication changes or additions [phone].

## 2018-04-20 DIAGNOSIS — E1169 Type 2 diabetes mellitus with other specified complication: Secondary | ICD-10-CM | POA: Diagnosis not present

## 2018-04-25 ENCOUNTER — Other Ambulatory Visit: Payer: Self-pay | Admitting: *Deleted

## 2018-04-25 MED ORDER — DRONEDARONE HCL 400 MG PO TABS: 400.0000 mg | ORAL_TABLET | Freq: Two times a day (BID) | ORAL | 2 refills | Status: DC

## 2018-04-25 NOTE — Telephone Encounter (Signed)
Refill for multaq sent to [facility] [place] as requested.

## 2018-05-04 ENCOUNTER — Ambulatory Visit (INDEPENDENT_AMBULATORY_CARE_PROVIDER_SITE_OTHER): Payer: BLUE CROSS/BLUE SHIELD

## 2018-05-04 DIAGNOSIS — I480 Paroxysmal atrial fibrillation: Secondary | ICD-10-CM | POA: Diagnosis not present

## 2018-05-04 DIAGNOSIS — Z7901 Long term (current) use of anticoagulants: Secondary | ICD-10-CM | POA: Diagnosis not present

## 2018-05-04 DIAGNOSIS — I099 Rheumatic heart disease, unspecified: Secondary | ICD-10-CM

## 2018-05-04 DIAGNOSIS — Z5181 Encounter for therapeutic drug level monitoring: Secondary | ICD-10-CM | POA: Diagnosis not present

## 2018-05-04 LAB — POCT INR: INR: 2.3 (ref 2.0–3.0)

## 2018-05-04 NOTE — Patient Instructions (Signed)
Description   Continue dose of 7.5 mg every day. Recheck INR in 4 weeks. Please be sure to keep your weekly intake of potassium rich vegetables consistent weekly. Call the office with any medication changes or additions [phone].

## 2018-05-11 DIAGNOSIS — I4891 Unspecified atrial fibrillation: Secondary | ICD-10-CM | POA: Diagnosis not present

## 2018-05-11 DIAGNOSIS — I050 Rheumatic mitral stenosis: Secondary | ICD-10-CM | POA: Diagnosis not present

## 2018-05-11 DIAGNOSIS — E785 Hyperlipidemia, unspecified: Secondary | ICD-10-CM | POA: Diagnosis not present

## 2018-05-11 DIAGNOSIS — E1169 Type 2 diabetes mellitus with other specified complication: Secondary | ICD-10-CM | POA: Diagnosis not present

## 2018-05-30 ENCOUNTER — Other Ambulatory Visit: Payer: Self-pay

## 2018-05-30 MED ORDER — WARFARIN SODIUM 5 MG PO TABS: ORAL_TABLET | ORAL | 1 refills | Status: DC

## 2018-06-01 ENCOUNTER — Ambulatory Visit (INDEPENDENT_AMBULATORY_CARE_PROVIDER_SITE_OTHER): Payer: BLUE CROSS/BLUE SHIELD

## 2018-06-01 DIAGNOSIS — Z7901 Long term (current) use of anticoagulants: Secondary | ICD-10-CM | POA: Diagnosis not present

## 2018-06-01 DIAGNOSIS — I099 Rheumatic heart disease, unspecified: Secondary | ICD-10-CM

## 2018-06-01 DIAGNOSIS — I480 Paroxysmal atrial fibrillation: Secondary | ICD-10-CM | POA: Diagnosis not present

## 2018-06-01 DIAGNOSIS — Z5181 Encounter for therapeutic drug level monitoring: Secondary | ICD-10-CM | POA: Diagnosis not present

## 2018-06-01 LAB — POCT INR: INR: 1.9 — AB (ref 2.0–3.0)

## 2018-06-01 NOTE — Patient Instructions (Signed)
Description   Take 10 mg today then continue dose of 7.5 mg every day. Recheck INR in 10 days. Please be sure to keep your weekly intake of potassium rich vegetables consistent weekly. Call the office with any medication changes or additions [phone].

## 2018-06-10 ENCOUNTER — Ambulatory Visit (INDEPENDENT_AMBULATORY_CARE_PROVIDER_SITE_OTHER): Payer: BLUE CROSS/BLUE SHIELD

## 2018-06-10 DIAGNOSIS — I480 Paroxysmal atrial fibrillation: Secondary | ICD-10-CM

## 2018-06-10 DIAGNOSIS — Z5181 Encounter for therapeutic drug level monitoring: Secondary | ICD-10-CM | POA: Diagnosis not present

## 2018-06-10 DIAGNOSIS — Z7901 Long term (current) use of anticoagulants: Secondary | ICD-10-CM

## 2018-06-10 DIAGNOSIS — I099 Rheumatic heart disease, unspecified: Secondary | ICD-10-CM | POA: Diagnosis not present

## 2018-06-10 LAB — POCT INR: INR: 2.2 (ref 2.0–3.0)

## 2018-06-10 NOTE — Patient Instructions (Signed)
Description   Continue dose of 7.5 mg every day. Recheck INR in 2 weeks. Please be sure to keep your weekly intake of potassium rich vegetables consistent weekly. Call the office with any medication changes or additions [phone].

## 2018-06-24 ENCOUNTER — Ambulatory Visit (INDEPENDENT_AMBULATORY_CARE_PROVIDER_SITE_OTHER): Payer: BLUE CROSS/BLUE SHIELD

## 2018-06-24 DIAGNOSIS — Z7901 Long term (current) use of anticoagulants: Secondary | ICD-10-CM | POA: Diagnosis not present

## 2018-06-24 DIAGNOSIS — I480 Paroxysmal atrial fibrillation: Secondary | ICD-10-CM | POA: Diagnosis not present

## 2018-06-24 DIAGNOSIS — I099 Rheumatic heart disease, unspecified: Secondary | ICD-10-CM

## 2018-06-24 DIAGNOSIS — Z5181 Encounter for therapeutic drug level monitoring: Secondary | ICD-10-CM

## 2018-06-24 LAB — POCT INR: INR: 1.7 — AB (ref 2.0–3.0)

## 2018-06-24 NOTE — Patient Instructions (Signed)
Description   Take 10 mg today and tomorrow then continue dose of 7.5 mg every day. Recheck INR in 1 week. Please be sure to keep your weekly intake of potassium rich vegetables consistent weekly. Call the office with any medication changes or additions [phone].

## 2018-07-05 ENCOUNTER — Ambulatory Visit (INDEPENDENT_AMBULATORY_CARE_PROVIDER_SITE_OTHER): Payer: BLUE CROSS/BLUE SHIELD | Admitting: Pharmacist

## 2018-07-05 DIAGNOSIS — I099 Rheumatic heart disease, unspecified: Secondary | ICD-10-CM | POA: Diagnosis not present

## 2018-07-05 DIAGNOSIS — I480 Paroxysmal atrial fibrillation: Secondary | ICD-10-CM | POA: Diagnosis not present

## 2018-07-05 DIAGNOSIS — Z7901 Long term (current) use of anticoagulants: Secondary | ICD-10-CM | POA: Diagnosis not present

## 2018-07-05 DIAGNOSIS — Z5181 Encounter for therapeutic drug level monitoring: Secondary | ICD-10-CM

## 2018-07-05 LAB — POCT INR: INR: 2.5 (ref 2.0–3.0)

## 2018-07-05 NOTE — Patient Instructions (Signed)
Description   Continue dose of 7.5 mg every day. Recheck INR in 2 weeks. Call the office with any medication changes or additions [phone].

## 2018-07-09 DIAGNOSIS — Z1231 Encounter for screening mammogram for malignant neoplasm of breast: Secondary | ICD-10-CM | POA: Diagnosis not present

## 2018-07-19 ENCOUNTER — Ambulatory Visit (INDEPENDENT_AMBULATORY_CARE_PROVIDER_SITE_OTHER): Payer: BLUE CROSS/BLUE SHIELD | Admitting: Pharmacist

## 2018-07-19 DIAGNOSIS — Z7901 Long term (current) use of anticoagulants: Secondary | ICD-10-CM

## 2018-07-19 DIAGNOSIS — Z5181 Encounter for therapeutic drug level monitoring: Secondary | ICD-10-CM | POA: Diagnosis not present

## 2018-07-19 DIAGNOSIS — I480 Paroxysmal atrial fibrillation: Secondary | ICD-10-CM

## 2018-07-19 DIAGNOSIS — I099 Rheumatic heart disease, unspecified: Secondary | ICD-10-CM

## 2018-07-19 LAB — POCT INR: INR: 2.6 (ref 2.0–3.0)

## 2018-07-19 NOTE — Patient Instructions (Signed)
Description   Continue dose of 7.5 mg every day. Recheck INR in 3 weeks. Call the office with any medication changes or additions [phone].

## 2018-07-28 ENCOUNTER — Ambulatory Visit (INDEPENDENT_AMBULATORY_CARE_PROVIDER_SITE_OTHER): Payer: BLUE CROSS/BLUE SHIELD | Admitting: Cardiology

## 2018-07-28 ENCOUNTER — Encounter: Payer: Self-pay | Admitting: Cardiology

## 2018-07-28 ENCOUNTER — Ambulatory Visit (INDEPENDENT_AMBULATORY_CARE_PROVIDER_SITE_OTHER): Payer: BLUE CROSS/BLUE SHIELD

## 2018-07-28 VITALS — BP 118/62 | HR 58 | Ht 64.0 in | Wt 149.6 lb

## 2018-07-28 DIAGNOSIS — I480 Paroxysmal atrial fibrillation: Secondary | ICD-10-CM

## 2018-07-28 DIAGNOSIS — I350 Nonrheumatic aortic (valve) stenosis: Secondary | ICD-10-CM | POA: Diagnosis not present

## 2018-07-28 DIAGNOSIS — I050 Rheumatic mitral stenosis: Secondary | ICD-10-CM | POA: Diagnosis not present

## 2018-07-28 DIAGNOSIS — I099 Rheumatic heart disease, unspecified: Secondary | ICD-10-CM

## 2018-07-28 DIAGNOSIS — Z5181 Encounter for therapeutic drug level monitoring: Secondary | ICD-10-CM

## 2018-07-28 DIAGNOSIS — Z7901 Long term (current) use of anticoagulants: Secondary | ICD-10-CM

## 2018-07-28 LAB — POCT INR: INR: 1.9 — AB (ref 2.0–3.0)

## 2018-07-28 NOTE — Progress Notes (Signed)
Cardiology Office Note:    Date:  07/28/2018   ID:  Melody Torres, DOB Mar 17, 1956, MRN [id number]  PCP:  Street, Stephanie Coup, MD  Cardiologist:  Gypsy Balsam, MD    Referring MD: Street, Stephanie Coup, *   Chief Complaint  Patient presents with  . Follow-up  Have some palpitations  History of Present Illness:    Melody Torres is a 62 y.o. female with history of rheumatic heart disease.  She does have moderate mitral stenosis also mild aortic stenosis.  Paroxysmal is of atrial fibrillation which she thinks became may be a little more frequent however she admits that she was situation she does keep the dose of Multaq.  She does have a device that allows him to record EKG and she did have episode of atrial fibrillation 2 recorded. Denies have any shortness of breath chest pain tightness squeezing pressure burning chest.  She goes on bands on the regular basis lately however she has not time to do it but overall seems to be doing well.  Past Medical History:  Diagnosis Date  . Diabetes mellitus without complication (HCC)   . Hyperlipidemia       Current Medications: Current Meds  Medication Sig  . Chromium (CHROMEMATE PO) Take 600 mg by mouth daily.   . digoxin (DIGOX) 0.25 MG tablet Take 1 tablet (250 mcg total) by mouth daily.  Marland Kitchen dronedarone (MULTAQ) 400 MG tablet Take 1 tablet (400 mg total) by mouth 2 (two) times daily.  Marland Kitchen JANUVIA 100 MG tablet Take 1 tablet by mouth daily.  Marland Kitchen omeprazole (PRILOSEC) 20 MG capsule Take 20 mg by mouth daily.  . pravastatin (PRAVACHOL) 40 MG tablet Take 1 tablet (40 mg total) by mouth daily with supper.  . warfarin (COUMADIN) 5 MG tablet Take As Directed by [facility]     Allergies:   Penicillins   Social History   Socioeconomic History  . Marital status: Married    Spouse name: Not on file  . Number of children: Not on file  . Years of education: Not on file  . Highest education level: Not on file  Occupational  History  . Not on file  Social Needs  . Financial resource strain: Not on file  . Food insecurity:    Worry: Not on file    Inability: Not on file  . Transportation needs:    Medical: Not on file    Non-medical: Not on file  Tobacco Use  . Smoking status: Never Smoker  . Smokeless tobacco: Never Used  Substance and Sexual Activity  . Alcohol use: No  . Drug use: No  . Sexual activity: Not on file  Lifestyle  . Physical activity:    Days per week: Not on file    Minutes per session: Not on file  . Stress: Not on file  Relationships  . Social connections:    Talks on phone: Not on file    Gets together: Not on file    Attends religious service: Not on file    Active member of club or organization: Not on file    Attends meetings of clubs or organizations: Not on file    Relationship status: Not on file  Other Topics Concern  . Not on file  Social History Narrative  . Not on file     Family History: The patient's family history includes Bladder Cancer in her mother; Diabetes in her father; Heart attack in her mother; Heart disease in  her father; Hypothyroidism in her mother; Stroke in her father. ROS:   Please see the history of present illness.    All 14 point review of systems negative except as described per history of present illness  EKGs/Labs/Other Studies Reviewed:      Recent Labs: No results found for requested labs within last 8760 hours.  Recent Lipid Panel    Component Value Date/Time   CHOL 140 04/29/2017 1656   TRIG 232 (H) 04/29/2017 1656   HDL 37 (L) 04/29/2017 1656   CHOLHDL 3.8 04/29/2017 1656   LDLCALC 57 04/29/2017 1656    Physical Exam:    VS:  BP 118/62   Pulse (!) 58   Ht 5\' 4"  (1.626 m)   Wt 149 lb 9.6 oz (67.9 kg)   SpO2 98%   BMI 25.68 kg/m     Wt Readings from Last 3 Encounters:  07/28/18 149 lb 9.6 oz (67.9 kg)  01/31/18 151 lb 6.4 oz (68.7 kg)  11/08/17 155 lb 1.9 oz (70.4 kg)     GEN:  Well nourished, well developed  in no acute distress HEENT: Normal NECK: No JVD; No carotid bruits LYMPHATICS: No lymphadenopathy CARDIAC: RRR, soft systolic murmur grade 1/6 best heard at right upper portion of the sternum also soft diastolic murmur heard at left border sternum.  1/6 both, no rubs, no gallops RESPIRATORY:  Clear to auscultation without rales, wheezing or rhonchi  ABDOMEN: Soft, non-tender, non-distended MUSCULOSKELETAL:  No edema; No deformity  SKIN: Warm and dry LOWER EXTREMITIES: no swelling NEUROLOGIC:  Alert and oriented x 3 PSYCHIATRIC:  Normal affect   ASSESSMENT:    1. Moderate mitral stenosis   2. Mild aortic stenosis   3. Paroxysmal atrial fibrillation (HCC)   4. Rheumatic heart disease   5. Long term (current) use of anticoagulants [Z79.01]    PLAN:    In order of problems listed above:  1. Moderate mitral stenosis.  It looks that she has slightly more atrial fibrillation now.  Portion of this is related to the fact that she forgot to take some medications.  I will ask you to have echocardiogram done to assess left ventricular ejection fraction more importantly to look at the degree of mitral stenosis and aortic stenosis. 2. Paroxysmal atrial fibrillation we will continue Multitak and anticoagulation. 3. Long-term anticoagulation because of rheumatic heart disease with moderate mitral stenosis she need to be on Coumadin which I will continue.  This is a viable atrial fibrillation.   Medication Adjustments/Labs and Tests Ordered: Current medicines are reviewed at length with the patient today.  Concerns regarding medicines are outlined above.  No orders of the defined types were placed in this encounter.  Medication changes: No orders of the defined types were placed in this encounter.   Signed, Georgeanna Lea, MD, [employer] 07/28/2018 11:25 AM    [facility]

## 2018-07-28 NOTE — Patient Instructions (Signed)
Medication Instructions:  Your physician recommends that you continue on your current medications as directed. Please refer to the Current Medication list given to you today.  Labwork: None ordered  Testing/Procedures: Your physician has requested that you have an echocardiogram. Echocardiography is a painless test that uses sound waves to create images of your heart. It provides your doctor with information about the size and shape of your heart and how well your heart's chambers and valves are working. This procedure takes approximately one hour. There are no restrictions for this procedure.  Follow-Up: Your physician recommends that you schedule a follow-up appointment in: 3 months with Dr. Krasowski  Any Other Special Instructions Will Be Listed Below (If Applicable).     If you need a refill on your cardiac medications before your next appointment, please call your pharmacy.   

## 2018-07-28 NOTE — Patient Instructions (Signed)
Description   Take 10 mg today then continue dose of 7.5 mg every day. Recheck INR in 10 days. Call the office with any medication changes or additions [phone].

## 2018-08-16 ENCOUNTER — Ambulatory Visit (INDEPENDENT_AMBULATORY_CARE_PROVIDER_SITE_OTHER): Payer: BLUE CROSS/BLUE SHIELD | Admitting: Pharmacist

## 2018-08-16 DIAGNOSIS — Z5181 Encounter for therapeutic drug level monitoring: Secondary | ICD-10-CM

## 2018-08-16 DIAGNOSIS — I099 Rheumatic heart disease, unspecified: Secondary | ICD-10-CM | POA: Diagnosis not present

## 2018-08-16 DIAGNOSIS — I480 Paroxysmal atrial fibrillation: Secondary | ICD-10-CM

## 2018-08-16 DIAGNOSIS — Z7901 Long term (current) use of anticoagulants: Secondary | ICD-10-CM

## 2018-08-16 LAB — POCT INR: INR: 2.3 (ref 2.0–3.0)

## 2018-08-16 NOTE — Patient Instructions (Signed)
Description   Continue dose of 7.5 mg every day. Recheck INR in 3 weeks. Call the office with any medication changes or additions [phone].     

## 2018-08-22 DIAGNOSIS — Z01419 Encounter for gynecological examination (general) (routine) without abnormal findings: Secondary | ICD-10-CM | POA: Diagnosis not present

## 2018-08-22 DIAGNOSIS — Z124 Encounter for screening for malignant neoplasm of cervix: Secondary | ICD-10-CM | POA: Diagnosis not present

## 2018-08-22 DIAGNOSIS — Z6825 Body mass index (BMI) 25.0-25.9, adult: Secondary | ICD-10-CM | POA: Diagnosis not present

## 2018-09-06 ENCOUNTER — Ambulatory Visit (INDEPENDENT_AMBULATORY_CARE_PROVIDER_SITE_OTHER): Payer: BLUE CROSS/BLUE SHIELD | Admitting: Pharmacist

## 2018-09-06 DIAGNOSIS — Z5181 Encounter for therapeutic drug level monitoring: Secondary | ICD-10-CM

## 2018-09-06 DIAGNOSIS — Z7901 Long term (current) use of anticoagulants: Secondary | ICD-10-CM | POA: Diagnosis not present

## 2018-09-06 DIAGNOSIS — I099 Rheumatic heart disease, unspecified: Secondary | ICD-10-CM

## 2018-09-06 DIAGNOSIS — I480 Paroxysmal atrial fibrillation: Secondary | ICD-10-CM

## 2018-09-06 LAB — POCT INR: INR: 2.2 (ref 2.0–3.0)

## 2018-09-06 NOTE — Patient Instructions (Addendum)
Description   Continue dose of 7.5 mg every day. Recheck INR in 4 weeks. Call the office with any medication changes or additions [phone].     

## 2018-09-13 DIAGNOSIS — Z23 Encounter for immunization: Secondary | ICD-10-CM | POA: Diagnosis not present

## 2018-09-13 DIAGNOSIS — I5030 Unspecified diastolic (congestive) heart failure: Secondary | ICD-10-CM | POA: Diagnosis not present

## 2018-09-13 DIAGNOSIS — I4891 Unspecified atrial fibrillation: Secondary | ICD-10-CM | POA: Diagnosis not present

## 2018-09-13 DIAGNOSIS — E785 Hyperlipidemia, unspecified: Secondary | ICD-10-CM | POA: Diagnosis not present

## 2018-09-13 DIAGNOSIS — E1169 Type 2 diabetes mellitus with other specified complication: Secondary | ICD-10-CM | POA: Diagnosis not present

## 2018-09-14 ENCOUNTER — Other Ambulatory Visit: Payer: Self-pay

## 2018-09-14 ENCOUNTER — Ambulatory Visit (INDEPENDENT_AMBULATORY_CARE_PROVIDER_SITE_OTHER): Payer: BLUE CROSS/BLUE SHIELD

## 2018-09-14 DIAGNOSIS — I350 Nonrheumatic aortic (valve) stenosis: Secondary | ICD-10-CM

## 2018-09-14 DIAGNOSIS — I480 Paroxysmal atrial fibrillation: Secondary | ICD-10-CM

## 2018-09-14 DIAGNOSIS — I050 Rheumatic mitral stenosis: Secondary | ICD-10-CM | POA: Diagnosis not present

## 2018-09-14 DIAGNOSIS — I099 Rheumatic heart disease, unspecified: Secondary | ICD-10-CM

## 2018-09-14 NOTE — Progress Notes (Signed)
Complete echocardiogram has been performed.  Jimmy Marielis Samara RCDS, RVT

## 2018-09-27 DIAGNOSIS — H2513 Age-related nuclear cataract, bilateral: Secondary | ICD-10-CM | POA: Diagnosis not present

## 2018-09-27 DIAGNOSIS — H5203 Hypermetropia, bilateral: Secondary | ICD-10-CM | POA: Diagnosis not present

## 2018-09-27 DIAGNOSIS — E119 Type 2 diabetes mellitus without complications: Secondary | ICD-10-CM | POA: Diagnosis not present

## 2018-10-04 ENCOUNTER — Ambulatory Visit (INDEPENDENT_AMBULATORY_CARE_PROVIDER_SITE_OTHER): Payer: BLUE CROSS/BLUE SHIELD | Admitting: Pharmacist

## 2018-10-04 DIAGNOSIS — I480 Paroxysmal atrial fibrillation: Secondary | ICD-10-CM

## 2018-10-04 DIAGNOSIS — I099 Rheumatic heart disease, unspecified: Secondary | ICD-10-CM

## 2018-10-04 DIAGNOSIS — Z5181 Encounter for therapeutic drug level monitoring: Secondary | ICD-10-CM

## 2018-10-04 DIAGNOSIS — Z7901 Long term (current) use of anticoagulants: Secondary | ICD-10-CM | POA: Diagnosis not present

## 2018-10-04 LAB — POCT INR: INR: 2.3 (ref 2.0–3.0)

## 2018-10-04 NOTE — Patient Instructions (Signed)
Description   Continue dose of 7.5 mg every day. Recheck INR in 4 weeks. Call the office with any medication changes or additions [phone].     

## 2018-10-07 ENCOUNTER — Ambulatory Visit (INDEPENDENT_AMBULATORY_CARE_PROVIDER_SITE_OTHER): Payer: BLUE CROSS/BLUE SHIELD | Admitting: Cardiology

## 2018-10-07 ENCOUNTER — Encounter: Payer: Self-pay | Admitting: Cardiology

## 2018-10-07 VITALS — BP 128/62 | HR 64 | Ht 64.0 in | Wt 149.8 lb

## 2018-10-07 DIAGNOSIS — I480 Paroxysmal atrial fibrillation: Secondary | ICD-10-CM

## 2018-10-07 DIAGNOSIS — Z7901 Long term (current) use of anticoagulants: Secondary | ICD-10-CM | POA: Diagnosis not present

## 2018-10-07 DIAGNOSIS — I050 Rheumatic mitral stenosis: Secondary | ICD-10-CM | POA: Diagnosis not present

## 2018-10-07 DIAGNOSIS — I099 Rheumatic heart disease, unspecified: Secondary | ICD-10-CM

## 2018-10-07 NOTE — Patient Instructions (Signed)
Medication Instructions:  Your physician recommends that you continue on your current medications as directed. Please refer to the Current Medication list given to you today.  If you need a refill on your cardiac medications before your next appointment, please call your pharmacy.   Lab work: None.  If you have labs (blood work) drawn today and your tests are completely normal, you will receive your results only by: . MyChart Message (if you have MyChart) OR . A paper copy in the mail If you have any lab test that is abnormal or we need to change your treatment, we will call you to review the results.  Testing/Procedures: None.   Follow-Up: At [facility], you and your health needs are our priority.  As part of our continuing mission to provide you with exceptional heart care, we have created designated Provider Care Teams.  These Care Teams include your primary Cardiologist (physician) and Advanced Practice Providers (APPs -  Physician Assistants and Nurse Practitioners) who all work together to provide you with the care you need, when you need it. You will need a follow up appointment in 3 months.  Please call our office 2 months in advance to schedule this appointment.  You may see Robert Krasowski, MD or another member of our [facility] Provider Team in [place]: Brian Munley, MD . Rajan Revankar, MD  Any Other Special Instructions Will Be Listed Below (If Applicable).     

## 2018-10-07 NOTE — Progress Notes (Signed)
Cardiology Office Note:    Date:  10/07/2018   ID:  Melody Torres, DOB February 17, 1956, MRN [id number]  PCP:  Street, [name], MD  Cardiologist:  Gypsy Balsamobert [place] Duerst, MD    Referring MD: Street, [name], *   Chief Complaint  Patient presents with  . Follow-up  Doing well  History of Present Illness:    Melody Torres is a 62 y.o. female with history of rheumatic heart disease she does have mild to moderate mitral regurgitation and lately worsening of mitral stenosis based on echocardiogram.  The mitral valve area appears to be less than 1.  I brought her to the office today to talk about this problem she feels fine she denies have any shortness of breath chest pain tightness squeezing pressure pain chest no swelling of lower extremities.  Denies having any palpitation she does have device that allowed her to record EKG if she get an palpitation to was no recording showing atrial fibrillation she does line dancing and she does not on the regular basis and she did not notice any decrease in exercise tolerance.  Past Medical History:  Diagnosis Date  . Diabetes mellitus without complication (HCC)   . Hyperlipidemia       Current Medications: Current Meds  Medication Sig  . Chromium (CHROMEMATE PO) Take 600 mg by mouth daily.   . digoxin (DIGOX) 0.25 MG tablet Take 1 tablet (250 mcg total) by mouth daily.  Marland Kitchen. dronedarone (MULTAQ) 400 MG tablet Take 1 tablet (400 mg total) by mouth 2 (two) times daily.  Marland Kitchen. JANUVIA 100 MG tablet Take 1 tablet by mouth daily.  Marland Kitchen. omeprazole (PRILOSEC) 20 MG capsule Take 20 mg by mouth daily.  . pravastatin (PRAVACHOL) 40 MG tablet Take 1 tablet (40 mg total) by mouth daily with supper.  . warfarin (COUMADIN) 5 MG tablet Take As Directed by [facility]     Allergies:   Penicillins   Social History   Socioeconomic History  . Marital status: Married    Spouse name: Not on file  . Number of children: Not on file  . Years of  education: Not on file  . Highest education level: Not on file  Occupational History  . Not on file  Social Needs  . Financial resource strain: Not on file  . Food insecurity:    Worry: Not on file    Inability: Not on file  . Transportation needs:    Medical: Not on file    Non-medical: Not on file  Tobacco Use  . Smoking status: Never Smoker  . Smokeless tobacco: Never Used  Substance and Sexual Activity  . Alcohol use: No  . Drug use: No  . Sexual activity: Not on file  Lifestyle  . Physical activity:    Days per week: Not on file    Minutes per session: Not on file  . Stress: Not on file  Relationships  . Social connections:    Talks on phone: Not on file    Gets together: Not on file    Attends religious service: Not on file    Active member of club or organization: Not on file    Attends meetings of clubs or organizations: Not on file    Relationship status: Not on file  Other Topics Concern  . Not on file  Social History Narrative  . Not on file     Family History: The patient's family history includes Bladder Cancer in her mother; Diabetes in  her father; Heart attack in her mother; Heart disease in her father; Hypothyroidism in her mother; Stroke in her father. ROS:   Please see the history of present illness.    All 14 point review of systems negative except as described per history of present illness  EKGs/Labs/Other Studies Reviewed:      Recent Labs: No results found for requested labs within last 8760 hours.  Recent Lipid Panel    Component Value Date/Time   CHOL 140 04/29/2017 1656   TRIG 232 (H) 04/29/2017 1656   HDL 37 (L) 04/29/2017 1656   CHOLHDL 3.8 04/29/2017 1656   LDLCALC 57 04/29/2017 1656    Physical Exam:    VS:  BP 128/62   Pulse 64   Ht 5\' 4"  (1.626 m)   Wt 149 lb 12.8 oz (67.9 kg)   SpO2 98%   BMI 25.71 kg/m     Wt Readings from Last 3 Encounters:  10/07/18 149 lb 12.8 oz (67.9 kg)  07/28/18 149 lb 9.6 oz (67.9 kg)    01/31/18 151 lb 6.4 oz (68.7 kg)     GEN:  Well nourished, well developed in no acute distress HEENT: Normal NECK: No JVD; No carotid bruits LYMPHATICS: No lymphadenopathy CARDIAC: RRR, holosystolic murmur grade 1/6 to 2/6 best at the left border of sternum.  There is also diastolic rumble but it is very soft only 1/6, no rubs, no gallops RESPIRATORY:  Clear to auscultation without rales, wheezing or rhonchi  ABDOMEN: Soft, non-tender, non-distended MUSCULOSKELETAL:  No edema; No deformity  SKIN: Warm and dry LOWER EXTREMITIES: no swelling NEUROLOGIC:  Alert and oriented x 3 PSYCHIATRIC:  Normal affect   ASSESSMENT:    1. Moderate mitral stenosis   2. Rheumatic heart disease   3. Anticoagulation adequate with anticoagulant therapy   4. Long term (current) use of anticoagulants [Z79.01]   5. Paroxysmal atrial fibrillation (HCC)    PLAN:    In order of problems listed above:  1. Mitral stenosis which appears to be severe now which is worsening as compared to echocardiogram done in January of this year I think he reached the point that potential intervention need to be contemplated.  Luckily she is asymptomatic however if we will be able to intervene with balloon valvuloplasty that something need to be considered I will ask our interventionalist colleague Dr. Excell Seltzer to look at her echocardiogram and see if she would be feasible for mitral valve valvuloplasty would make me what his degree of mitral regurgitation that she has.  I told her that she may require transesophageal echocardiogram for better assessment of the valve. 2. Rheumatic heart disease.  Noted. 3. Anticoagulation with Coumadin which we will continue. 4. Paroxysmal atrial fibrillation denies having any.  She does have device that allowed her to record EKG when she got palpitations so far successfully she recorded to recordings showing atrial fibrillation before I saw her last time since that time there is no more atrial  fibrillation.   Medication Adjustments/Labs and Tests Ordered: Current medicines are reviewed at length with the patient today.  Concerns regarding medicines are outlined above.  No orders of the defined types were placed in this encounter.  Medication changes: No orders of the defined types were placed in this encounter.   Signed, Georgeanna Lea, MD, [facility] 10/07/2018 2:17 PM    [facility]

## 2018-10-11 DIAGNOSIS — R002 Palpitations: Secondary | ICD-10-CM | POA: Diagnosis not present

## 2018-10-11 DIAGNOSIS — Z7901 Long term (current) use of anticoagulants: Secondary | ICD-10-CM | POA: Diagnosis not present

## 2018-10-11 DIAGNOSIS — I480 Paroxysmal atrial fibrillation: Secondary | ICD-10-CM | POA: Diagnosis not present

## 2018-10-17 ENCOUNTER — Ambulatory Visit (INDEPENDENT_AMBULATORY_CARE_PROVIDER_SITE_OTHER): Payer: BLUE CROSS/BLUE SHIELD | Admitting: Cardiology

## 2018-10-17 ENCOUNTER — Encounter: Payer: Self-pay | Admitting: Cardiology

## 2018-10-17 VITALS — BP 128/66 | HR 73 | Ht 64.0 in | Wt 148.6 lb

## 2018-10-17 DIAGNOSIS — I480 Paroxysmal atrial fibrillation: Secondary | ICD-10-CM

## 2018-10-17 DIAGNOSIS — Z7901 Long term (current) use of anticoagulants: Secondary | ICD-10-CM | POA: Diagnosis not present

## 2018-10-17 DIAGNOSIS — I350 Nonrheumatic aortic (valve) stenosis: Secondary | ICD-10-CM | POA: Diagnosis not present

## 2018-10-17 DIAGNOSIS — I051 Rheumatic mitral insufficiency: Secondary | ICD-10-CM

## 2018-10-17 NOTE — Progress Notes (Signed)
Cardiology Office Note:    Date:  10/17/2018   ID:  Melody Bakerlaine S Karaffa, DOB 13-Feb-1956, MRN [id number]  PCP:  Street, [name], MD  Cardiologist:  Gypsy Balsamobert , MD    Referring MD: Street, [name], *   Chief Complaint  Patient presents with  . Hospitalization Follow-up  . Atrial Fibrillation  I was in the emergency room over the weekend  History of Present Illness:    Melody Torres is a 62 y.o. female with mitral stenosis which is rheumatic that is getting worse recently went to the emergency room because of atrial fibrillation rate was 107 she waited 2 hours before she went there after that she converted spontaneously.  She is anticoagulated.  Described to have a little more shortness of breath than before but otherwise seems to be doing well I asked Dr. Excell Seltzerooper to look at her echocardiogram to see if she would be potential candidate for balloon valvuloplasty he thinks that this is 1 of the options therefore will refer her [facility] for possible balloon valvuloplasty.  When she was in the emergency room her EKG showed some changes which raising suspicion for ischemia however she did not have any symptoms of ischemia I am sure that if we decide to proceed with a valve surgery or melena plasty we will do assessment of her coronary arteries most likely by cardiac catheterization.  Past Medical History:  Diagnosis Date  . Diabetes mellitus without complication (HCC)   . Hyperlipidemia     Past Surgical History:  Procedure Laterality Date  . NO PAST SURGERIES      Current Medications: Current Meds  Medication Sig  . Chromium (CHROMEMATE PO) Take 600 mg by mouth daily.   . digoxin (DIGOX) 0.25 MG tablet Take 1 tablet (250 mcg total) by mouth daily.  Marland Kitchen. dronedarone (MULTAQ) 400 MG tablet Take 1 tablet (400 mg total) by mouth 2 (two) times daily.  Marland Kitchen. JANUVIA 100 MG tablet Take 1 tablet by mouth daily.  Marland Kitchen. omeprazole (PRILOSEC) 20 MG  capsule Take 20 mg by mouth daily.  . pravastatin (PRAVACHOL) 40 MG tablet Take 1 tablet (40 mg total) by mouth daily with supper.  . warfarin (COUMADIN) 5 MG tablet Take As Directed by [facility]     Allergies:   Penicillins   Social History   Socioeconomic History  . Marital status: Married    Spouse name: Not on file  . Number of children: Not on file  . Years of education: Not on file  . Highest education level: Not on file  Occupational History  . Not on file  Social Needs  . Financial resource strain: Not on file  . Food insecurity:    Worry: Not on file    Inability: Not on file  . Transportation needs:    Medical: Not on file    Non-medical: Not on file  Tobacco Use  . Smoking status: Never Smoker  . Smokeless tobacco: Never Used  Substance and Sexual Activity  . Alcohol use: No  . Drug use: No  . Sexual activity: Not on file  Lifestyle  . Physical activity:    Days per week: Not on file    Minutes per session: Not on file  . Stress: Not on file  Relationships  . Social connections:    Talks on phone: Not on file    Gets together: Not on file    Attends religious service: Not on  file    Active member of club or organization: Not on file    Attends meetings of clubs or organizations: Not on file    Relationship status: Not on file  Other Topics Concern  . Not on file  Social History Narrative  . Not on file     Family History: The patient's family history includes Bladder Cancer in her mother; Diabetes in her father; Heart attack in her mother; Heart disease in her father; Hypothyroidism in her mother; Stroke in her father. ROS:   Please see the history of present illness.    All 14 point review of systems negative except as described per history of present illness  EKGs/Labs/Other Studies Reviewed:      Recent Labs: No results found for requested labs within last 8760 hours.  Recent Lipid Panel    Component Value Date/Time   CHOL 140  04/29/2017 1656   TRIG 232 (H) 04/29/2017 1656   HDL 37 (L) 04/29/2017 1656   CHOLHDL 3.8 04/29/2017 1656   LDLCALC 57 04/29/2017 1656    Physical Exam:    VS:  BP 128/66   Pulse 73   Ht 5\' 4"  (1.626 m)   Wt 148 lb 9.6 oz (67.4 kg)   SpO2 98%   BMI 25.51 kg/m     Wt Readings from Last 3 Encounters:  10/17/18 148 lb 9.6 oz (67.4 kg)  10/07/18 149 lb 12.8 oz (67.9 kg)  07/28/18 149 lb 9.6 oz (67.9 kg)     GEN:  Well nourished, well developed in no acute distress HEENT: Normal NECK: No JVD; No carotid bruits LYMPHATICS: No lymphadenopathy CARDIAC: RRR, systolic murmur grade 2/6 best heard right upper portion of the sternum with injection Characteristic also systolic murmur grade two 2/6 best heard at left border sternum.  I have difficulty hearing diastolic rumble., no rubs, no gallops RESPIRATORY:  Clear to auscultation without rales, wheezing or rhonchi  ABDOMEN: Soft, non-tender, non-distended MUSCULOSKELETAL:  No edema; No deformity  SKIN: Warm and dry LOWER EXTREMITIES: no swelling NEUROLOGIC:  Alert and oriented x 3 PSYCHIATRIC:  Normal affect   ASSESSMENT:    1. Paroxysmal atrial fibrillation (HCC)   2. Rheumatic mitral regurgitation   3. Anticoagulation adequate with anticoagulant therapy   4. Mild aortic stenosis    PLAN:    In order of problems listed above:  1. Paroxysmal atrial fibrillation anticoagulated with Coumadin which I will continue.  Recent episode.  She is on Multaq which I will continue.  Converted spontaneously to sinus rhythm 2. Rheumatic mitral regurgitation/stenosis again situation getting more significant therefore I [facility] for possible balloon valvuloplasty. 3. Anticoagulation which I will continue. 4. Mild aortic stenosis.  Noted   Medication Adjustments/Labs and Tests Ordered: Current medicines are reviewed at length with the patient today.  Concerns regarding medicines are outlined above.  No orders of  the defined types were placed in this encounter.  Medication changes: No orders of the defined types were placed in this encounter.   Signed, Georgeanna Leaobert J. , MD, [facility] 10/17/2018 11:59 AM    [facility]

## 2018-10-17 NOTE — Patient Instructions (Signed)
Medication Instructions:  Your physician recommends that you continue on your current medications as directed. Please refer to the Current Medication list given to you today.  If you need a refill on your cardiac medications before your next appointment, please call your pharmacy.   Lab work: None ordered If you have labs (blood work) drawn today and your tests are completely normal, you will receive your results only by: Marland Kitchen. MyChart Message (if you have MyChart) OR . A paper copy in the mail If you have any lab test that is abnormal or we need to change your treatment, we will call you to review the results.  Testing/Procedures: None ordered  Follow-Up: At [facility], you and your health needs are our priority.  As part of our continuing mission to provide you with exceptional heart care, we have created designated Provider Care Teams.  These Care Teams include your primary Cardiologist (physician) and Advanced Practice Providers (APPs -  Physician Assistants and Nurse Practitioners) who all work together to provide you with the care you need, when you need it. You will need a follow up appointment in 2 months.  You may see Gypsy Balsamobert Krasowski, MD or another member of our [facility] Provider Team in [place]: Norman HerrlichBrian Munley, MD . Belva Cromeajan Revankar, MD  Any Other Special Instructions Will Be Listed Below (If Applicable). We will be doing a referral for [facility] to [facility]. Please allow 2-3 weeks to hear from them. If you do not hear anything please call and let us know.

## 2018-10-26 DIAGNOSIS — I2510 Atherosclerotic heart disease of native coronary artery without angina pectoris: Secondary | ICD-10-CM

## 2018-10-26 HISTORY — DX: Atherosclerotic heart disease of native coronary artery without angina pectoris: I25.10

## 2018-11-01 ENCOUNTER — Ambulatory Visit (INDEPENDENT_AMBULATORY_CARE_PROVIDER_SITE_OTHER): Payer: BLUE CROSS/BLUE SHIELD | Admitting: Pharmacist

## 2018-11-01 DIAGNOSIS — I099 Rheumatic heart disease, unspecified: Secondary | ICD-10-CM

## 2018-11-01 DIAGNOSIS — I480 Paroxysmal atrial fibrillation: Secondary | ICD-10-CM | POA: Diagnosis not present

## 2018-11-01 DIAGNOSIS — Z7901 Long term (current) use of anticoagulants: Secondary | ICD-10-CM | POA: Diagnosis not present

## 2018-11-01 DIAGNOSIS — Z5181 Encounter for therapeutic drug level monitoring: Secondary | ICD-10-CM | POA: Diagnosis not present

## 2018-11-01 LAB — POCT INR: INR: 2.3 (ref 2.0–3.0)

## 2018-11-01 NOTE — Patient Instructions (Signed)
Description   Continue dose of 7.5 mg every day. Recheck INR in 6 weeks. Call the office with any medication changes or additions [phone].     

## 2018-11-07 ENCOUNTER — Telehealth: Payer: Self-pay | Admitting: Cardiology

## 2018-11-07 MED ORDER — DIGOXIN 250 MCG PO TABS: 250.0000 ug | ORAL_TABLET | Freq: Every day | ORAL | 1 refills | Status: DC

## 2018-11-07 NOTE — Telephone Encounter (Signed)
°*  STAT* If patient is at the pharmacy, call can be transferred to refill team.   1. Which medications need to be refilled? (please list name of each medication and dose if known) Digoxin takes 1 daily   2. Which pharmacy/location (including street and city if local pharmacy) is medication to be sent to?[facility]  3. Do they need a 30 day or 90 day supply? 90

## 2018-11-09 ENCOUNTER — Telehealth: Payer: Self-pay | Admitting: Cardiology

## 2018-11-09 NOTE — Telephone Encounter (Signed)
Wants to know if you have heard anything from [facility]

## 2018-11-10 NOTE — Addendum Note (Signed)
Addended by: Lita Mains on: 11/10/2018 05:03 PM   Modules accepted: Orders

## 2018-11-10 NOTE — Telephone Encounter (Signed)
Referral has been refaxed to Dr. Regino Schultze. Left message informing patient of this

## 2018-11-10 NOTE — Telephone Encounter (Signed)
No, I did not hear anything  Please send one more referal to dr Regino Schultze at [facility]

## 2018-11-11 NOTE — Telephone Encounter (Signed)
Patient called back. I informed her that fax has been resent for referral she verbally understands and will call to let us know if she hasn't heard from them next week

## 2018-11-12 DIAGNOSIS — J0100 Acute maxillary sinusitis, unspecified: Secondary | ICD-10-CM | POA: Diagnosis not present

## 2018-11-14 ENCOUNTER — Telehealth: Payer: Self-pay | Admitting: Cardiology

## 2018-11-14 NOTE — Telephone Encounter (Signed)
Patient is sick with Sinus infections and is on an antibiotic and she needs to know when she needs to have it checked again??

## 2018-11-14 NOTE — Telephone Encounter (Signed)
Spoke with patient she stared doxycycline on Saturday. Will come for INR check tomorrow.

## 2018-11-15 ENCOUNTER — Ambulatory Visit (INDEPENDENT_AMBULATORY_CARE_PROVIDER_SITE_OTHER): Payer: BLUE CROSS/BLUE SHIELD | Admitting: *Deleted

## 2018-11-15 ENCOUNTER — Telehealth: Payer: Self-pay | Admitting: Emergency Medicine

## 2018-11-15 DIAGNOSIS — I480 Paroxysmal atrial fibrillation: Secondary | ICD-10-CM

## 2018-11-15 DIAGNOSIS — Z5181 Encounter for therapeutic drug level monitoring: Secondary | ICD-10-CM

## 2018-11-15 DIAGNOSIS — Z7901 Long term (current) use of anticoagulants: Secondary | ICD-10-CM | POA: Diagnosis not present

## 2018-11-15 DIAGNOSIS — I099 Rheumatic heart disease, unspecified: Secondary | ICD-10-CM | POA: Diagnosis not present

## 2018-11-15 LAB — POCT INR: INR: 2.4 (ref 2.0–3.0)

## 2018-11-15 NOTE — Telephone Encounter (Signed)
Patient returned your call.

## 2018-11-15 NOTE — Patient Instructions (Signed)
Description   Continue dose of 7.5 mg every day. Recheck INR in 6 weeks. Call the office with any medication changes or additions [phone].

## 2018-11-15 NOTE — Telephone Encounter (Signed)
Patient was seen in [place] today for coumdain appointment. During her appointment she asked Tiffany, pharmacist about her referral with Dr. Regino Schultze. I followed up on this and was told that they haven't received the referral yet even though we have confirmation it went through. I have refaxed it today to [place] and [facility] today. Left message for patient to return call to inform her of this

## 2018-11-17 NOTE — Telephone Encounter (Signed)
Patient returned your call, please call.

## 2018-11-23 ENCOUNTER — Other Ambulatory Visit: Payer: Self-pay | Admitting: *Deleted

## 2018-11-23 MED ORDER — PRAVASTATIN SODIUM 40 MG PO TABS: 40.0000 mg | ORAL_TABLET | Freq: Every day | ORAL | 0 refills | Status: DC

## 2018-11-23 NOTE — Telephone Encounter (Signed)
Refill for pravastatin sent to [facility] in [place] as requested.

## 2018-12-05 DIAGNOSIS — E7800 Pure hypercholesterolemia, unspecified: Secondary | ICD-10-CM | POA: Insufficient documentation

## 2018-12-05 DIAGNOSIS — I059 Rheumatic mitral valve disease, unspecified: Secondary | ICD-10-CM

## 2018-12-05 HISTORY — DX: Pure hypercholesterolemia, unspecified: E78.00

## 2018-12-05 HISTORY — DX: Rheumatic mitral valve disease, unspecified: I05.9

## 2018-12-09 ENCOUNTER — Telehealth: Payer: Self-pay | Admitting: Cardiology

## 2018-12-09 DIAGNOSIS — Z7901 Long term (current) use of anticoagulants: Secondary | ICD-10-CM

## 2018-12-09 DIAGNOSIS — I480 Paroxysmal atrial fibrillation: Secondary | ICD-10-CM

## 2018-12-09 NOTE — Telephone Encounter (Signed)
Informed patient we have and Dr. Bing Matter will advise she have a tee done. We will work on getting this scheduled and call patient back with date and time. Patient verbally understands

## 2018-12-09 NOTE — Telephone Encounter (Signed)
Wants tou know if yall have heard from [facility]

## 2018-12-13 ENCOUNTER — Ambulatory Visit (INDEPENDENT_AMBULATORY_CARE_PROVIDER_SITE_OTHER): Payer: BLUE CROSS/BLUE SHIELD | Admitting: *Deleted

## 2018-12-13 DIAGNOSIS — I480 Paroxysmal atrial fibrillation: Secondary | ICD-10-CM | POA: Diagnosis not present

## 2018-12-13 DIAGNOSIS — Z7901 Long term (current) use of anticoagulants: Secondary | ICD-10-CM | POA: Diagnosis not present

## 2018-12-13 DIAGNOSIS — Z5181 Encounter for therapeutic drug level monitoring: Secondary | ICD-10-CM

## 2018-12-13 DIAGNOSIS — I099 Rheumatic heart disease, unspecified: Secondary | ICD-10-CM

## 2018-12-13 LAB — POCT INR: INR: 1.9 — AB (ref 2.0–3.0)

## 2018-12-13 NOTE — Patient Instructions (Signed)
Description   Today take 10mg , then Continue dose of 7.5 mg every day. Recheck INR in 4 weeks. Call the office with any medication changes or additions [phone].

## 2018-12-13 NOTE — Telephone Encounter (Signed)
Patient has been scheduled for tee. Left message for patient to return call to inform her of this along with instructions.

## 2018-12-14 NOTE — Telephone Encounter (Signed)
  Patient informed of appointment for tee March 13th with arrival time of 8:30 am. I informed her this is at [facility]. I also informed her that she needs to remain npo after midnight the night before except for water with medications, hold januvia the day of the test until after and after she has ate, and advised her to have lab work drawn two days before the test. Cbc, bmp, and pt/inr. I will follow up with Dr. Bing Matter about holding coumadin or not for this test and inform patient. Patient verbally understands and has no further questions.

## 2018-12-14 NOTE — Addendum Note (Signed)
Addended by: Lita Mains on: 12/14/2018 08:38 AM   Modules accepted: Orders

## 2018-12-21 ENCOUNTER — Telehealth: Payer: Self-pay | Admitting: Emergency Medicine

## 2018-12-21 NOTE — Telephone Encounter (Signed)
error 

## 2018-12-28 ENCOUNTER — Other Ambulatory Visit: Payer: Self-pay

## 2018-12-28 MED ORDER — WARFARIN SODIUM 5 MG PO TABS: ORAL_TABLET | ORAL | 0 refills | Status: DC

## 2018-12-28 NOTE — Telephone Encounter (Signed)
Patient request for warfarin refills approved pending 12/2018 office visit.

## 2019-01-03 NOTE — Telephone Encounter (Signed)
Left message for patient to return call.

## 2019-01-03 NOTE — Telephone Encounter (Signed)
No need to hold coumadin for TEE

## 2019-01-04 DIAGNOSIS — I480 Paroxysmal atrial fibrillation: Secondary | ICD-10-CM | POA: Diagnosis not present

## 2019-01-04 DIAGNOSIS — Z7901 Long term (current) use of anticoagulants: Secondary | ICD-10-CM | POA: Diagnosis not present

## 2019-01-04 NOTE — Telephone Encounter (Signed)
Patient informed that she doesn't need to hold coumadin before tee. She verbally understands.

## 2019-01-05 LAB — CBC
HEMATOCRIT: 38.9 % (ref 34.0–46.6)
HEMOGLOBIN: 13.6 g/dL (ref 11.1–15.9)
MCH: 30.4 pg (ref 26.6–33.0)
MCHC: 35 g/dL (ref 31.5–35.7)
MCV: 87 fL (ref 79–97)
Platelets: 182 10*3/uL (ref 150–450)
RBC: 4.47 x10E6/uL (ref 3.77–5.28)
RDW: 12.8 % (ref 11.7–15.4)
WBC: 8.8 10*3/uL (ref 3.4–10.8)

## 2019-01-05 LAB — BASIC METABOLIC PANEL
BUN/Creatinine Ratio: 16 (ref 12–28)
BUN: 14 mg/dL (ref 8–27)
CALCIUM: 9.5 mg/dL (ref 8.7–10.3)
CO2: 24 mmol/L (ref 20–29)
CREATININE: 0.89 mg/dL (ref 0.57–1.00)
Chloride: 102 mmol/L (ref 96–106)
GFR calc Af Amer: 79 mL/min/{1.73_m2} (ref >59)
GFR, EST NON AFRICAN AMERICAN: 69 mL/min/{1.73_m2} (ref >59)
Glucose: 113 mg/dL — ABNORMAL HIGH (ref 65–99)
POTASSIUM: 4.5 mmol/L (ref 3.5–5.2)
Sodium: 141 mmol/L (ref 134–144)

## 2019-01-05 LAB — PROTIME-INR
INR: 1.9 — ABNORMAL HIGH (ref 0.8–1.2)
PROTHROMBIN TIME: 18.6 s — AB (ref 9.1–12.0)

## 2019-01-06 ENCOUNTER — Ambulatory Visit (HOSPITAL_COMMUNITY): Payer: BLUE CROSS/BLUE SHIELD | Admitting: Anesthesiology

## 2019-01-06 ENCOUNTER — Ambulatory Visit (HOSPITAL_BASED_OUTPATIENT_CLINIC_OR_DEPARTMENT_OTHER)
Admission: RE | Admit: 2019-01-06 | Discharge: 2019-01-06 | Disposition: A | Discharge status: Home / Self Care | Payer: BLUE CROSS/BLUE SHIELD | Source: Home / Self Care | Attending: Cardiology | Admitting: Cardiology

## 2019-01-06 ENCOUNTER — Other Ambulatory Visit: Payer: Self-pay

## 2019-01-06 ENCOUNTER — Encounter (HOSPITAL_COMMUNITY): Payer: Self-pay

## 2019-01-06 ENCOUNTER — Encounter (HOSPITAL_COMMUNITY)
Admission: RE | Disposition: A | Discharge status: Home / Self Care | Payer: Self-pay | Source: Home / Self Care | Attending: Cardiology

## 2019-01-06 ENCOUNTER — Ambulatory Visit (HOSPITAL_COMMUNITY)
Admission: RE | Admit: 2019-01-06 | Discharge: 2019-01-06 | Disposition: A | Discharge status: Home / Self Care | Payer: BLUE CROSS/BLUE SHIELD | Attending: Cardiology | Admitting: Cardiology

## 2019-01-06 ENCOUNTER — Other Ambulatory Visit (HOSPITAL_COMMUNITY): Payer: BLUE CROSS/BLUE SHIELD

## 2019-01-06 DIAGNOSIS — Z8249 Family history of ischemic heart disease and other diseases of the circulatory system: Secondary | ICD-10-CM | POA: Insufficient documentation

## 2019-01-06 DIAGNOSIS — Z79899 Other long term (current) drug therapy: Secondary | ICD-10-CM | POA: Insufficient documentation

## 2019-01-06 DIAGNOSIS — I420 Dilated cardiomyopathy: Secondary | ICD-10-CM | POA: Insufficient documentation

## 2019-01-06 DIAGNOSIS — R0609 Other forms of dyspnea: Secondary | ICD-10-CM | POA: Insufficient documentation

## 2019-01-06 DIAGNOSIS — E119 Type 2 diabetes mellitus without complications: Secondary | ICD-10-CM | POA: Diagnosis not present

## 2019-01-06 DIAGNOSIS — I272 Pulmonary hypertension, unspecified: Secondary | ICD-10-CM | POA: Diagnosis not present

## 2019-01-06 DIAGNOSIS — I081 Rheumatic disorders of both mitral and tricuspid valves: Secondary | ICD-10-CM | POA: Insufficient documentation

## 2019-01-06 DIAGNOSIS — Z883 Allergy status to other anti-infective agents status: Secondary | ICD-10-CM | POA: Insufficient documentation

## 2019-01-06 DIAGNOSIS — Z823 Family history of stroke: Secondary | ICD-10-CM | POA: Diagnosis not present

## 2019-01-06 DIAGNOSIS — I342 Nonrheumatic mitral (valve) stenosis: Secondary | ICD-10-CM | POA: Diagnosis not present

## 2019-01-06 DIAGNOSIS — E785 Hyperlipidemia, unspecified: Secondary | ICD-10-CM | POA: Insufficient documentation

## 2019-01-06 DIAGNOSIS — Z880 Allergy status to penicillin: Secondary | ICD-10-CM | POA: Insufficient documentation

## 2019-01-06 DIAGNOSIS — Z7901 Long term (current) use of anticoagulants: Secondary | ICD-10-CM | POA: Diagnosis not present

## 2019-01-06 DIAGNOSIS — E109 Type 1 diabetes mellitus without complications: Secondary | ICD-10-CM | POA: Diagnosis not present

## 2019-01-06 DIAGNOSIS — I051 Rheumatic mitral insufficiency: Secondary | ICD-10-CM | POA: Diagnosis not present

## 2019-01-06 DIAGNOSIS — I10 Essential (primary) hypertension: Secondary | ICD-10-CM | POA: Diagnosis not present

## 2019-01-06 DIAGNOSIS — I340 Nonrheumatic mitral (valve) insufficiency: Secondary | ICD-10-CM | POA: Diagnosis not present

## 2019-01-06 HISTORY — PX: TEE WITHOUT CARDIOVERSION: SHX5443

## 2019-01-06 LAB — GLUCOSE, CAPILLARY: Glucose-Capillary: 126 mg/dL — ABNORMAL HIGH (ref 70–99)

## 2019-01-06 SURGERY — ECHOCARDIOGRAM, TRANSESOPHAGEAL
Anesthesia: Monitor Anesthesia Care

## 2019-01-06 MED ORDER — PROPOFOL 500 MG/50ML IV EMUL
INTRAVENOUS | Status: DC | PRN
  Administered 2019-01-06: 75 ug/kg/min via INTRAVENOUS

## 2019-01-06 MED ORDER — LIDOCAINE HCL (PF) 2 % IJ SOLN
INTRAMUSCULAR | Status: DC | PRN
  Administered 2019-01-06: 100 mg via INTRADERMAL

## 2019-01-06 MED ORDER — PROPOFOL 10 MG/ML IV BOLUS
INTRAVENOUS | Status: DC | PRN
  Administered 2019-01-06: 30 mg via INTRAVENOUS
  Administered 2019-01-06 (×2): 20 mg via INTRAVENOUS
  Administered 2019-01-06: 30 mg via INTRAVENOUS
  Administered 2019-01-06: 20 mg via INTRAVENOUS
  Administered 2019-01-06 (×5): 30 mg via INTRAVENOUS

## 2019-01-06 MED ORDER — SODIUM CHLORIDE 0.9 % IV SOLN
INTRAVENOUS | Status: DC
  Administered 2019-01-06: 09:00:00 via INTRAVENOUS

## 2019-01-06 NOTE — Anesthesia Preprocedure Evaluation (Signed)
Anesthesia Evaluation  Patient identified by MRN, date of birth, ID band Patient awake    Reviewed: Allergy & Precautions, NPO status , Patient's Chart, lab work & pertinent test results  Airway Mallampati: II  TM Distance: >3 FB Neck ROM: Full    Dental no notable dental hx. (+) Teeth Intact   Pulmonary neg pulmonary ROS,    Pulmonary exam normal breath sounds clear to auscultation       Cardiovascular hypertension, Normal cardiovascular exam+ dysrhythmias + Valvular Problems/Murmurs  Rhythm:Regular Rate:Normal  09/14/2018 Echo Left ventricle: The cavity size was normal. There was mild   concentric hypertrophy. Systolic function was normal. The   estimated ejection fraction was in the range of 55% to 60%. Wall   motion was normal; there were no regional wall motion   abnormalities. - Aortic valve: Valve area (VTI): 1.67 cm^2. Valve area (Vmax):   1.75 cm^2. Valve area (Vmean): 1.71 cm^2. - Mitral valve: Calcification. Mobility was restricted. The   findings are consistent with severe stenosis. There was moderate   regurgitation. Valve area by pressure half-time: 1.09 cm^2. Valve   area by continuity equation (using LVOT flow): 0.7 cm^2. - Left atrium: The atrium was severely dilated. - Pulmonary arteries: PA peak pressure: 39 mm Hg (S).   Neuro/Psych negative neurological ROS  negative psych ROS   GI/Hepatic negative GI ROS, Neg liver ROS,   Endo/Other  diabetes, Type 1  Renal/GU negative Renal ROS     Musculoskeletal   Abdominal   Peds  Hematology   Anesthesia Other Findings   Reproductive/Obstetrics                             Anesthesia Physical Anesthesia Plan  ASA: II  Anesthesia Plan: MAC   Post-op Pain Management:    Induction: Intravenous  PONV Risk Score and Plan: Treatment may vary due to age or medical condition  Airway Management Planned: Nasal Cannula and Natural  Airway  Additional Equipment:   Intra-op Plan:   Post-operative Plan:   Informed Consent: I have reviewed the patients History and Physical, chart, labs and discussed the procedure including the risks, benefits and alternatives for the proposed anesthesia with the patient or authorized representative who has indicated his/her understanding and acceptance.     Dental advisory given  Plan Discussed with: CRNA  Anesthesia Plan Comments:         Anesthesia Quick Evaluation

## 2019-01-06 NOTE — CV Procedure (Signed)
     Transesophageal Echocardiogram Note  CLELLA MCCOSH [id number] [birth year]  Procedure: Transesophageal Echocardiogram Indications: rheumatic heart disease  Procedure Details Consent: Obtained Time Out: Verified patient identification, verified procedure, site/side was marked, verified correct patient position, special equipment/implants available, Radiology Safety Procedures followed,  medications/allergies/relevent history reviewed, required imaging and test results available.  Performed  Medications: IV Propofol  Hyperdynamic LVEF Severe mitral stenosis and severe mitral regurgitation Moderate tricuspid regurgitation RVSP 42 mmHg  Complications: No apparent complications Patient did tolerate procedure well.  Tobias Alexander, MD, [facility] 01/06/2019, 11:16 AM

## 2019-01-06 NOTE — Discharge Instructions (Signed)

## 2019-01-06 NOTE — Anesthesia Postprocedure Evaluation (Signed)
Anesthesia Post Note  Patient: Melody Torres  Procedure(s) Performed: TRANSESOPHAGEAL ECHOCARDIOGRAM (TEE) (N/A )     Patient location during evaluation: Endoscopy Anesthesia Type: MAC Level of consciousness: awake and alert Pain management: pain level controlled Vital Signs Assessment: post-procedure vital signs reviewed and stable Respiratory status: spontaneous breathing, nonlabored ventilation, respiratory function stable and patient connected to nasal cannula oxygen Cardiovascular status: blood pressure returned to baseline and stable Postop Assessment: no apparent nausea or vomiting Anesthetic complications: no    Last Vitals:  Vitals:   01/06/19 1110 01/06/19 1120  BP: (!) 98/51 (!) 111/56  Pulse: 69 66  Resp: 19 18  Temp:    SpO2: 94% 94%    Last Pain:  Vitals:   01/06/19 1120  TempSrc:   PainSc: 0-No pain                 Barnet Glasgow

## 2019-01-06 NOTE — Transfer of Care (Signed)
Immediate Anesthesia Transfer of Care Note  Patient: Melody Torres  Procedure(s) Performed: TRANSESOPHAGEAL ECHOCARDIOGRAM (TEE) (N/A )  Patient Location: PACU and Endoscopy Unit  Anesthesia Type:MAC  Level of Consciousness: drowsy  Airway & Oxygen Therapy: Patient Spontanous Breathing and Patient connected to nasal cannula oxygen  Post-op Assessment: Report given to RN and Post -op Vital signs reviewed and stable  Post vital signs: Reviewed and stable  Last Vitals:  Vitals Value Taken Time  BP    Temp    Pulse    Resp    SpO2      Last Pain:  Vitals:   01/06/19 0835  TempSrc: Oral  PainSc: 0-No pain         Complications: No apparent anesthesia complications

## 2019-01-06 NOTE — Progress Notes (Signed)
  Echocardiogram Echocardiogram Transesophageal has been performed.  [name] 01/06/2019, 11:05 AM

## 2019-01-06 NOTE — H&P (Signed)
Cardiology Admission History and Physical:   Patient ID: Melody Torres MRN: [id number]; DOB: 1956-09-27   Admission date: 01/06/2019  Primary Care Provider: Street, Stephanie Coup, MD Primary Cardiologist: Gypsy Balsam, MD  Primary Electrophysiologist:  None   Chief Complaint:  DOE  Patient Profile:   Melody Torres is a 63 y.o. female with known rheumatic heart disease.   History of Present Illness:   [name] with known rheumatic heart disease with known mitral regurgitation and stenosis.   Past Medical History:  Diagnosis Date  . Diabetes mellitus without complication (HCC)   . Hyperlipidemia     Past Surgical History:  Procedure Laterality Date  . NO PAST SURGERIES       Medications Prior to Admission: Prior to Admission medications   Medication Sig Start Date End Date Taking? Authorizing Provider  cetirizine (ZYRTEC) 10 MG tablet Take 10 mg by mouth daily.   Yes [provider]  Chromium (CHROMEMATE PO) Take 600 mg by mouth daily.    Yes [provider]  digoxin (DIGOX) 0.25 MG tablet Take 1 tablet (250 mcg total) by mouth daily. 11/07/18  Yes Georgeanna Lea, MD  dronedarone (MULTAQ) 400 MG tablet Take 1 tablet (400 mg total) by mouth 2 (two) times daily. 04/25/18  Yes Georgeanna Lea, MD  JANUVIA 100 MG tablet Take 100 mg by mouth daily.  04/14/17  Yes [provider]  pravastatin (PRAVACHOL) 40 MG tablet Take 1 tablet (40 mg total) by mouth daily with supper. 11/23/18  Yes Georgeanna Lea, MD  warfarin (COUMADIN) 5 MG tablet Take As Directed by [facility] Clinic Patient taking differently: Take 7.5 mg by mouth daily.  12/28/18  Yes Georgeanna Lea, MD  omeprazole (PRILOSEC) 20 MG capsule Take 20 mg by mouth daily as needed (acid reflux).  10/25/15   [provider]     Allergies:    Allergies  Allergen Reactions  . Penicillins Rash    Did it involve swelling of the face/tongue/throat, SOB, or low BP?  No Did it involve sudden or severe rash/hives, skin peeling, or any reaction on the inside of your mouth or nose? Yes Did you need to seek medical attention at a hospital or doctor's office? No When did it last happen?Childhood rxn If all above answers are "NO", may proceed with cephalosporin use.    Social History:   Social History   Socioeconomic History  . Marital status: Widowed    Spouse name: Not on file  . Number of children: Not on file  . Years of education: Not on file  . Highest education level: Not on file  Occupational History  . Not on file  Social Needs  . Financial resource strain: Not on file  . Food insecurity:    Worry: Not on file    Inability: Not on file  . Transportation needs:    Medical: Not on file    Non-medical: Not on file  Tobacco Use  . Smoking status: Never Smoker  . Smokeless tobacco: Never Used  Substance and Sexual Activity  . Alcohol use: No  . Drug use: No  . Sexual activity: Not on file  Lifestyle  . Physical activity:    Days per week: Not on file    Minutes per session: Not on file  . Stress: Not on file  Relationships  . Social connections:    Talks on phone: Not on file    Gets together: Not on file  Attends religious service: Not on file    Active member of club or organization: Not on file    Attends meetings of clubs or organizations: Not on file    Relationship status: Not on file  . Intimate partner violence:    Fear of current or ex partner: Not on file    Emotionally abused: Not on file    Physically abused: Not on file    Forced sexual activity: Not on file  Other Topics Concern  . Not on file  Social History Narrative  . Not on file    Family History:   The patient's family history includes Bladder Cancer in her mother; Diabetes in her father; Heart attack in her mother; Heart disease in her father; Hypothyroidism in her mother; Stroke in her father.    ROS:  Please see the history of present illness.   No fever, chills, cough, no recent travel. All other ROS reviewed and negative.     Physical Exam/Data:   Vitals:   01/06/19 0835  BP: (!) 136/111  Pulse: 63  Resp: 15  Temp: 97.8 F (36.6 C)  TempSrc: Oral  SpO2: 100%  Weight: 64.4 kg  Height: 5\' 4"  (1.626 m)   No intake or output data in the 24 hours ending 01/06/19 0953 Last 3 Weights 01/06/2019 10/17/2018 10/07/2018  Weight (lbs) 142 lb 148 lb 9.6 oz 149 lb 12.8 oz  Weight (kg) 64.411 kg 67.405 kg 67.949 kg     Body mass index is 24.37 kg/m.  General:  Well nourished, well developed, in no acute distress HEENT: normal Lymph: no adenopathy Neck: no JVD Endocrine:  No thryomegaly Vascular: No carotid bruits; FA pulses 2+ bilaterally without bruits  Cardiac:  normal S1, S2; RRR; 4/6 systolic and 3/6 diastolic murmur Lungs:  clear to auscultation bilaterally, no wheezing, rhonchi or rales  Abd: soft, nontender, no hepatomegaly  Ext: no edema Musculoskeletal:  No deformities, BUE and BLE strength normal and equal Skin: warm and dry  Neuro:  CNs 2-12 intact, no focal abnormalities noted Psych:  Normal affect    EKG:  none  Relevant CV Studies:   Laboratory Data:  Chemistry Recent Labs  Lab 01/04/19 1127  NA 141  K 4.5  CL 102  CO2 24  GLUCOSE 113*  BUN 14  CREATININE 0.89  CALCIUM 9.5  GFRNONAA 69  GFRAA 79    No results for input(s): PROT, ALBUMIN, AST, ALT, ALKPHOS, BILITOT in the last 168 hours. Hematology Recent Labs  Lab 01/04/19 1127  WBC 8.8  RBC 4.47  HGB 13.6  HCT 38.9  MCV 87  MCH 30.4  MCHC 35.0  RDW 12.8  PLT 182   Cardiac EnzymesNo results for input(s): TROPONINI in the last 168 hours. No results for input(s): TROPIPOC in the last 168 hours.  BNPNo results for input(s): BNP, PROBNP in the last 168 hours.  DDimer No results for input(s): DDIMER in the last 168 hours.  Radiology/Studies:  No results found.  Assessment and Plan:   1. Rheumatic heart disease - came for TEE  evaluation if MR and MS for possible mitral valvuloplasty.     For questions or updates, please contact [facility] Please consult [email or web address] for contact info under        Signed, Tobias Alexander, MD  01/06/2019 9:53 AM

## 2019-01-07 ENCOUNTER — Encounter (HOSPITAL_COMMUNITY): Payer: Self-pay | Admitting: Cardiology

## 2019-01-10 ENCOUNTER — Other Ambulatory Visit: Payer: Self-pay

## 2019-01-10 ENCOUNTER — Ambulatory Visit (INDEPENDENT_AMBULATORY_CARE_PROVIDER_SITE_OTHER): Payer: BLUE CROSS/BLUE SHIELD | Admitting: *Deleted

## 2019-01-10 DIAGNOSIS — I480 Paroxysmal atrial fibrillation: Secondary | ICD-10-CM | POA: Diagnosis not present

## 2019-01-10 DIAGNOSIS — Z7901 Long term (current) use of anticoagulants: Secondary | ICD-10-CM | POA: Diagnosis not present

## 2019-01-10 DIAGNOSIS — Z5181 Encounter for therapeutic drug level monitoring: Secondary | ICD-10-CM

## 2019-01-10 DIAGNOSIS — I099 Rheumatic heart disease, unspecified: Secondary | ICD-10-CM

## 2019-01-10 LAB — POCT INR: INR: 2.2 (ref 2.0–3.0)

## 2019-01-10 NOTE — Patient Instructions (Signed)
Description   Continue dose of 7.5 mg every day. Recheck INR in 4 weeks. Call the office with any medication changes or additions [phone].

## 2019-01-17 ENCOUNTER — Telehealth: Payer: Self-pay | Admitting: Emergency Medicine

## 2019-01-17 NOTE — Telephone Encounter (Signed)
Patient confirmed virtual visit tomorrow.

## 2019-01-18 ENCOUNTER — Telehealth: Payer: Self-pay | Admitting: Cardiology

## 2019-01-18 ENCOUNTER — Other Ambulatory Visit: Payer: Self-pay

## 2019-01-18 ENCOUNTER — Encounter: Payer: Self-pay | Admitting: Cardiology

## 2019-01-18 ENCOUNTER — Telehealth (INDEPENDENT_AMBULATORY_CARE_PROVIDER_SITE_OTHER): Payer: BLUE CROSS/BLUE SHIELD | Admitting: Cardiology

## 2019-01-18 VITALS — Ht 64.0 in | Wt 142.0 lb

## 2019-01-18 DIAGNOSIS — I099 Rheumatic heart disease, unspecified: Secondary | ICD-10-CM | POA: Diagnosis not present

## 2019-01-18 DIAGNOSIS — I350 Nonrheumatic aortic (valve) stenosis: Secondary | ICD-10-CM

## 2019-01-18 DIAGNOSIS — I050 Rheumatic mitral stenosis: Secondary | ICD-10-CM | POA: Diagnosis not present

## 2019-01-18 DIAGNOSIS — I051 Rheumatic mitral insufficiency: Secondary | ICD-10-CM | POA: Diagnosis not present

## 2019-01-18 DIAGNOSIS — I480 Paroxysmal atrial fibrillation: Secondary | ICD-10-CM | POA: Diagnosis not present

## 2019-01-18 DIAGNOSIS — Z7901 Long term (current) use of anticoagulants: Secondary | ICD-10-CM

## 2019-01-18 NOTE — Progress Notes (Signed)
Evaluation Performed:  Follow-up visit  This visit type was conducted due to national recommendations for restrictions regarding the COVID-19 Pandemic (e.g. social distancing).  This format is felt to be most appropriate for this patient at this time.  All issues noted in this document were discussed and addressed.  No physical exam was performed (except for noted visual exam findings with Video Visits).  Please refer to the patient's chart (MyChart message for video visits and phone note for telephone visits) for the patient's consent to telehealth for [facility].    Date:  01/18/2019  ID: Melody Torres, DOB 1956/06/06, MRN [id number]   Patient Location:  [address] [place] [address]   Provider location:   [facility] [place] Office  PCP:  Street, Stephanie Coup, MD  Cardiologist:  Gypsy Balsam, MD     Chief Complaint: Doing well and here to discuss results of my transesophageal echocardiogram  History of Present Illness:    Melody Torres is a 63 y.o. female  who presents via audio/video conferencing for a telehealth visit today.  She does have significant rheumatic heart disease which includes mitral valve stenosis as well as mitral valve regurgitation.  The purpose of today's visit was to discuss results of her TEE that she had done recently.  She overall does well she denies having any chest pain tightness squeezing pressure burning chest.  She does have some palpitations from time to time and reports to have some episode of atrial fibrillation.  There is no shortness of breath there is no swelling of lower extremities there is no proximal nocturnal dyspnea.  Overall she is doing well   The patient does not symptoms concerning for COVID-19 infection (fever, chills, cough, or new SHORTNESS OF BREATH).    Prior CV studies:   The following studies were reviewed today:  Visually she does not have any swelling and looks good     Past Medical History:   Diagnosis Date  . Diabetes mellitus without complication (HCC)   . Hyperlipidemia     Past Surgical History:  Procedure Laterality Date  . NO PAST SURGERIES    . TEE WITHOUT CARDIOVERSION N/A 01/06/2019   Procedure: TRANSESOPHAGEAL ECHOCARDIOGRAM (TEE);  Surgeon: Lars Masson, MD;  Location: [facility] ENDOSCOPY;  Service: Cardiovascular;  Laterality: N/A;     Current Meds  Medication Sig  . cetirizine (ZYRTEC) 10 MG tablet Take 10 mg by mouth daily.  . Chromium (CHROMEMATE PO) Take 600 mg by mouth daily.   . digoxin (DIGOX) 0.25 MG tablet Take 1 tablet (250 mcg total) by mouth daily.  Marland Kitchen dronedarone (MULTAQ) 400 MG tablet Take 1 tablet (400 mg total) by mouth 2 (two) times daily.  Marland Kitchen JANUVIA 100 MG tablet Take 100 mg by mouth daily.   Marland Kitchen omeprazole (PRILOSEC) 20 MG capsule Take 20 mg by mouth daily as needed (acid reflux).   . pravastatin (PRAVACHOL) 40 MG tablet Take 1 tablet (40 mg total) by mouth daily with supper.  . warfarin (COUMADIN) 5 MG tablet Take As Directed by Coumadin Clinic (Patient taking differently: Take 7.5 mg by mouth daily. )      Family History: The patient's family history includes Bladder Cancer in her mother; Diabetes in her father; Heart attack in her mother; Heart disease in her father; Hypothyroidism in her mother; Stroke in her father.   ROS:   Please see the history of present illness.     All other systems reviewed and are negative.  Labs/Other Tests and Data Reviewed:     Recent Labs: 01/04/2019: BUN 14; Creatinine, Ser 0.89; Hemoglobin 13.6; Platelets 182; Potassium 4.5; Sodium 141  Recent Lipid Panel    Component Value Date/Time   CHOL 140 04/29/2017 1656   TRIG 232 (H) 04/29/2017 1656   HDL 37 (L) 04/29/2017 1656   CHOLHDL 3.8 04/29/2017 1656   LDLCALC 57 04/29/2017 1656      Exam:    Vital Signs:  Wt 141 lb (64 kg)   BMI 24.20 kg/m    Well nourished, well developed female in no acute distress.   Diagnosis for this visit:    1. Paroxysmal atrial fibrillation (HCC)   2. Severe mitral valve stenosis   3. Rheumatic heart disease   4. Rheumatic mitral regurgitation   5. Mild aortic stenosis   6. Anticoagulation adequate with anticoagulant therapy      ASSESSMENT & PLAN:    1.  Severe mitral valve stenosis.  There was a hope that we may be able to fix this with balloon valvuloplasty however her TEE revealed significant calcification of the valve with Wilkins score of 13.  On top of that she does have significant mitral regurgitation therefore balloon valvuloplasty is most likely out of the question.  We need to have mitral valve replacement.  We discussed in length this issue however now with a coronavirus situation we cannot put this procedure on hold with understanding if she develops some symptoms will progress much more rapidly.  I asked her to let me know if she develops shortness of breath swelling of lower extremities or more palpitations.  Also explained to her that the first step will be to perform cardiac catheterization to check her coronary arteries.  I explained procedure to her including all risk benefits as well as alternatives. 2.  Paroxysmal atrial fibrillation she reports some palpitations however those are rare however she also tells me there was a more frequent overall.  Continue anticoagulation. 3.  Anticoagulation therapy: We will continue monitoring her INR.     COVID-19 Education: The signs and symptoms of COVID-19 were discussed with the patient and how to seek care for testing (follow up with PCP or arrange E-visit).  The importance of social distancing was discussed today.  Patient Risk:   After full review of this patients clinical status, I feel that they are at least moderate risk at this time.  Time:   Today, I have spent 22 minutes with the patient with telehealth technology discussing pt health issues.     Medication Adjustments/Labs and Tests Ordered: Current medicines are  reviewed at length with the patient today.  Concerns regarding medicines are outlined above.  No orders of the defined types were placed in this encounter.  Medication changes: No orders of the defined types were placed in this encounter.    Disposition: Follow-up visits in 1 month probably doing virtual visit.  She is to let me know she develops any symptoms of shortness of breath swelling of lower extremities  Signed, Georgeanna Lea, MD, [facility] 01/18/2019 11:46 AM    [facility]

## 2019-01-18 NOTE — Patient Instructions (Signed)
Medication Instructions:  Your physician recommends that you continue on your current medications as directed. Please refer to the Current Medication list given to you today.  If you need a refill on your cardiac medications before your next appointment, please call your pharmacy.   Lab work: None.  If you have labs (blood work) drawn today and your tests are completely normal, you will receive your results only by: . MyChart Message (if you have MyChart) OR . A paper copy in the mail If you have any lab test that is abnormal or we need to change your treatment, we will call you to review the results.  Testing/Procedures: None.   Follow-Up: At [facility], you and your health needs are our priority.  As part of our continuing mission to provide you with exceptional heart care, we have created designated Provider Care Teams.  These Care Teams include your primary Cardiologist (physician) and Advanced Practice Providers (APPs -  Physician Assistants and Nurse Practitioners) who all work together to provide you with the care you need, when you need it. You will need a follow up appointment in 1 months.  Please call our office 2 months in advance to schedule this appointment.  You may see Robert Krasowski, MD or another member of our [facility] Provider Team in [place]: Brian Munley, MD . Rajan Revankar, MD  Any Other Special Instructions Will Be Listed Below (If Applicable).     

## 2019-01-18 NOTE — Telephone Encounter (Signed)
error 

## 2019-01-31 ENCOUNTER — Other Ambulatory Visit: Payer: Self-pay

## 2019-01-31 MED ORDER — DIGOXIN 250 MCG PO TABS: 250.0000 ug | ORAL_TABLET | Freq: Every day | ORAL | 1 refills | Status: DC

## 2019-02-06 ENCOUNTER — Telehealth: Payer: Self-pay | Admitting: *Deleted

## 2019-02-06 NOTE — Telephone Encounter (Signed)

## 2019-02-07 ENCOUNTER — Other Ambulatory Visit: Payer: Self-pay

## 2019-02-07 ENCOUNTER — Ambulatory Visit (INDEPENDENT_AMBULATORY_CARE_PROVIDER_SITE_OTHER): Payer: BLUE CROSS/BLUE SHIELD | Admitting: *Deleted

## 2019-02-07 DIAGNOSIS — I480 Paroxysmal atrial fibrillation: Secondary | ICD-10-CM | POA: Diagnosis not present

## 2019-02-07 DIAGNOSIS — Z7901 Long term (current) use of anticoagulants: Secondary | ICD-10-CM | POA: Diagnosis not present

## 2019-02-07 DIAGNOSIS — I099 Rheumatic heart disease, unspecified: Secondary | ICD-10-CM

## 2019-02-07 DIAGNOSIS — Z5181 Encounter for therapeutic drug level monitoring: Secondary | ICD-10-CM

## 2019-02-07 LAB — POCT INR: INR: 3.1 — AB (ref 2.0–3.0)

## 2019-02-21 ENCOUNTER — Telehealth (INDEPENDENT_AMBULATORY_CARE_PROVIDER_SITE_OTHER): Payer: BLUE CROSS/BLUE SHIELD | Admitting: Cardiology

## 2019-02-21 ENCOUNTER — Other Ambulatory Visit: Payer: Self-pay

## 2019-02-21 ENCOUNTER — Encounter: Payer: Self-pay | Admitting: Cardiology

## 2019-02-21 VITALS — BP 113/95 | HR 73 | Wt 142.0 lb

## 2019-02-21 DIAGNOSIS — I099 Rheumatic heart disease, unspecified: Secondary | ICD-10-CM

## 2019-02-21 DIAGNOSIS — I350 Nonrheumatic aortic (valve) stenosis: Secondary | ICD-10-CM

## 2019-02-21 DIAGNOSIS — I480 Paroxysmal atrial fibrillation: Secondary | ICD-10-CM

## 2019-02-21 DIAGNOSIS — Z7901 Long term (current) use of anticoagulants: Secondary | ICD-10-CM

## 2019-02-21 DIAGNOSIS — I050 Rheumatic mitral stenosis: Secondary | ICD-10-CM | POA: Diagnosis not present

## 2019-02-21 NOTE — Patient Instructions (Signed)
Medication Instructions:  Your physician recommends that you continue on your current medications as directed. Please refer to the Current Medication list given to you today.  If you need a refill on your cardiac medications before your next appointment, please call your pharmacy.   Lab work: None If you have labs (blood work) drawn today and your tests are completely normal, you will receive your results only by: . MyChart Message (if you have MyChart) OR . A paper copy in the mail If you have any lab test that is abnormal or we need to change your treatment, we will call you to review the results.  Testing/Procedures: None  Follow-Up: At [facility], you and your health needs are our priority.  As part of our continuing mission to provide you with exceptional heart care, we have created designated Provider Care Teams.  These Care Teams include your primary Cardiologist (physician) and Advanced Practice Providers (APPs -  Physician Assistants and Nurse Practitioners) who all work together to provide you with the care you need, when you need it. You will need a follow up appointment in 1 months Any Other Special Instructions Will Be Listed Below (If Applicable).    

## 2019-02-21 NOTE — Progress Notes (Signed)
Virtual Visit via Video Note   This visit type was conducted due to national recommendations for restrictions regarding the COVID-19 Pandemic (e.g. social distancing) in an effort to limit this patient's exposure and mitigate transmission in our community.  Due to her co-morbid illnesses, this patient is at least at moderate risk for complications without adequate follow up.  This format is felt to be most appropriate for this patient at this time.  All issues noted in this document were discussed and addressed.  A limited physical exam was performed with this format.  Please refer to the patient's chart for her consent to telehealth for [facility].  Evaluation Performed:  Follow-up visit  This visit type was conducted due to national recommendations for restrictions regarding the COVID-19 Pandemic (e.g. social distancing).  This format is felt to be most appropriate for this patient at this time.  All issues noted in this document were discussed and addressed.  No physical exam was performed (except for noted visual exam findings with Video Visits).  Please refer to the patient's chart (MyChart message for video visits and phone note for telephone visits) for the patient's consent to telehealth for [facility].  Date:  02/21/2019  ID: Melody Torres, DOB 08/13/1956, MRN [id number]   Patient Location: [address] [place] [address]   Provider location:   [facility] [place] Office  PCP:  Street, Stephanie Coup, MD  Cardiologist:  Gypsy Balsam, MD     Chief Complaint: I am doing well  History of Present Illness:    Melody Torres is a 63 y.o. female  who presents via audio/video conferencing for a telehealth visit today.  Past medical history significant for rheumatic mitral stenosis with mitral regurgitation.  Also paroxysmal atrial fibrillation.  Clinically complain of having some exertional shortness of breath but overall stable.  Recently we did quite aggressive  evaluation trying to check feasibility of balloon mitral valve valvuloplasty.  She had of having transesophageal echocardiogram which showed significant mitral regurgitation as well as rheumatic mitral valve it looks like she will not be candidate for mitral valve repair/balloon valvuloplasty.  We had discussion today trying to establish stability of the patient and she is doing well there is nothing new happening.  She still try to walk and walk.  She does get short of breath.  But nothing worse.  Rare palpitations otherwise.  Of risk because of severity of her mitral valve stenosis she will require cardiac catheterization to look at her coronary arteries and then make a decision which way will be the best way to address the issue of mitral valve stenosis.  She most likely will require mitral valve replacement.  I talked to her about this issue today we will make arrangements for cardiac catheterization then she will be seen in our follow-up clinic in terms of determining which will be the best way to manage her problems.   The patient does not have symptoms concerning for COVID-19 infection (fever, chills, cough, or new SHORTNESS OF BREATH).    Prior CV studies:   The following studies were reviewed today:  Transesophageal echocardiogram: Done on January 06, 2019 showed: 1. The right ventricle has normal systolc function. Right ventricular systolic pressure is moderately elevated.  2. Left atrial size was severely dilated.  3. Right atrial size was mildly dilated.  4. Moderately thickened tricuspid valve leaflets.  5. The mitral valve is rheumatic. Severe thickening of the mitral valve leaflet. Severe calcification of the  mitral valve leaflet. Mitral valve regurgitation is severe by color flow Doppler. The MR jet is wall-impinging. Critical mitral valve stenosis.  6. Tricuspid valve regurgitation is moderate.  7. The left ventricle has hyperdynamic systolic function, with an ejection fraction of  >65%.     Past Medical History:  Diagnosis Date  . Diabetes mellitus without complication (HCC)   . Hyperlipidemia     Past Surgical History:  Procedure Laterality Date  . NO PAST SURGERIES    . TEE WITHOUT CARDIOVERSION N/A 01/06/2019   Procedure: TRANSESOPHAGEAL ECHOCARDIOGRAM (TEE);  Surgeon: Lars MassonNelson, Katarina H, MD;  [facility];  Service: Cardiovascular;  Laterality: N/A;     Current Meds  Medication Sig  . cetirizine (ZYRTEC) 10 MG tablet Take 10 mg by mouth daily.  . Chromium (CHROMEMATE PO) Take 600 mg by mouth daily.   . digoxin (DIGOX) 0.25 MG tablet Take 1 tablet (250 mcg total) by mouth daily.  Marland Kitchen. dronedarone (MULTAQ) 400 MG tablet Take 1 tablet (400 mg total) by mouth 2 (two) times daily.  Marland Kitchen. JANUVIA 100 MG tablet Take 100 mg by mouth daily.   Marland Kitchen. omeprazole (PRILOSEC) 20 MG capsule Take 20 mg by mouth daily as needed (acid reflux).   . pravastatin (PRAVACHOL) 40 MG tablet Take 1 tablet (40 mg total) by mouth daily with supper.  . warfarin (COUMADIN) 5 MG tablet Take As Directed by Coumadin Clinic (Patient taking differently: Take 7.5 mg by mouth daily. )      Family History: The patient's family history includes Bladder Cancer in her mother; Diabetes in her father; Heart attack in her mother; Heart disease in her father; Hypothyroidism in her mother; Stroke in her father.   ROS:   Please see the history of present illness.     All other systems reviewed and are negative.   Labs/Other Tests and Data Reviewed:     Recent Labs: 01/04/2019: BUN 14; Creatinine, Ser 0.89; Hemoglobin 13.6; Platelets 182; Potassium 4.5; Sodium 141  Recent Lipid Panel    Component Value Date/Time   CHOL 140 04/29/2017 1656   TRIG 232 (H) 04/29/2017 1656   HDL 37 (L) 04/29/2017 1656   CHOLHDL 3.8 04/29/2017 1656   LDLCALC 57 04/29/2017 1656      Exam:    Vital Signs:  BP (!) 113/95   Pulse 73   Wt 142 lb (64.4 kg)   BMI 24.37 kg/m     Wt Readings from Last 3  Encounters:  02/21/19 142 lb (64.4 kg)  01/18/19 142 lb (64.4 kg)  01/06/19 142 lb (64.4 kg)     Well nourished, well developed in no acute distress. Entered to help with the video link she is alert awake oriented x3 not in any distress.  Denies having any issue recently no swelling of lower extremities  Diagnosis for this visit:   1. Severe mitral valve stenosis   2. Paroxysmal atrial fibrillation (HCC)   3. Rheumatic heart disease   4. Mild aortic stenosis   5. Long term (current) use of anticoagulants [Z79.01]      ASSESSMENT & PLAN:    1.  Severe mitral valve stenosis.  Residents will be made to do cardiac catheterization as a preparation for potential mitral valve replacement. 2.  Paroxysmal atrial fibrillation denies having any recent palpitations.  Anticoagulated which I will continue. 3.  Rheumatic heart disease.  Noted. 4.  Mild aortic stenosis.  Noted. 5.  Long-term anticoagulation.  Will continue.  COVID-19 Education: The  signs and symptoms of COVID-19 were discussed with the patient and how to seek care for testing (follow up with PCP or arrange E-visit).  The importance of social distancing was discussed today.  Patient Risk:   After full review of this patients clinical status, I feel that they are at least moderate risk at this time.  Time:   Today, I have spent 21 minutes with the patient with telehealth technology discussing pt health issues.  I spent 7 minutes reviewing her chart before the visit.  Visit was finished at 11:00.    Medication Adjustments/Labs and Tests Ordered: Current medicines are reviewed at length with the patient today.  Concerns regarding medicines are outlined above.  No orders of the defined types were placed in this encounter.  Medication changes: No orders of the defined types were placed in this encounter.    Disposition: Follow-up in 1 month.  Arrangements for cardiac catheterization will be made  Signed, Georgeanna Lea, MD, [facility] 02/21/2019 11:36 AM    [facility]

## 2019-02-22 ENCOUNTER — Telehealth: Payer: Self-pay | Admitting: Emergency Medicine

## 2019-02-22 NOTE — Telephone Encounter (Signed)
Patient informed that we will be reaching out in 1 month to schedule cardiac catheterization. Patient verbally understood.

## 2019-02-23 ENCOUNTER — Telehealth: Payer: Self-pay | Admitting: *Deleted

## 2019-02-23 MED ORDER — DRONEDARONE HCL 400 MG PO TABS: 400.0000 mg | ORAL_TABLET | Freq: Two times a day (BID) | ORAL | 2 refills | Status: DC

## 2019-02-23 NOTE — Telephone Encounter (Signed)
Rx refill sent to pharmacy.  *STAT* If patient is at the pharmacy, call can be transferred to refill team.   1. Which medications need to be refilled? (please list name of each medication and dose if known) Multaq 400mg , bid  2. Which pharmacy/location (including street and city if local pharmacy) is medication to be sent to?[facility]  3. Do they need a 30 day or 90 day supply? 30

## 2019-03-06 ENCOUNTER — Telehealth: Payer: Self-pay

## 2019-03-06 NOTE — Telephone Encounter (Signed)
lmom for prescreen/drive thru aware 

## 2019-03-07 ENCOUNTER — Other Ambulatory Visit: Payer: Self-pay

## 2019-03-07 ENCOUNTER — Ambulatory Visit (INDEPENDENT_AMBULATORY_CARE_PROVIDER_SITE_OTHER): Payer: BLUE CROSS/BLUE SHIELD

## 2019-03-07 DIAGNOSIS — I480 Paroxysmal atrial fibrillation: Secondary | ICD-10-CM | POA: Diagnosis not present

## 2019-03-07 DIAGNOSIS — I099 Rheumatic heart disease, unspecified: Secondary | ICD-10-CM | POA: Diagnosis not present

## 2019-03-07 DIAGNOSIS — Z5181 Encounter for therapeutic drug level monitoring: Secondary | ICD-10-CM | POA: Diagnosis not present

## 2019-03-07 DIAGNOSIS — Z7901 Long term (current) use of anticoagulants: Secondary | ICD-10-CM

## 2019-03-07 LAB — POCT INR: INR: 3.1 — AB (ref 2.0–3.0)

## 2019-03-07 NOTE — Patient Instructions (Signed)
Description   Called spoke with pt, advised to skip today's dosage of Coumadin, then resume same dosage 7.5 mg every day. Recheck INR in 4 weeks. Call the office with any medication changes or additions [phone].

## 2019-03-17 ENCOUNTER — Other Ambulatory Visit: Payer: Self-pay

## 2019-03-21 MED ORDER — WARFARIN SODIUM 5 MG PO TABS: ORAL_TABLET | ORAL | 0 refills | Status: DC

## 2019-03-24 ENCOUNTER — Other Ambulatory Visit: Payer: Self-pay

## 2019-03-24 ENCOUNTER — Telehealth (INDEPENDENT_AMBULATORY_CARE_PROVIDER_SITE_OTHER): Payer: BLUE CROSS/BLUE SHIELD | Admitting: Cardiology

## 2019-03-24 ENCOUNTER — Encounter: Payer: Self-pay | Admitting: Cardiology

## 2019-03-24 VITALS — BP 118/78 | HR 87 | Wt 142.0 lb

## 2019-03-24 DIAGNOSIS — I480 Paroxysmal atrial fibrillation: Secondary | ICD-10-CM | POA: Diagnosis not present

## 2019-03-24 DIAGNOSIS — I050 Rheumatic mitral stenosis: Secondary | ICD-10-CM | POA: Diagnosis not present

## 2019-03-24 DIAGNOSIS — Z8679 Personal history of other diseases of the circulatory system: Secondary | ICD-10-CM | POA: Diagnosis not present

## 2019-03-24 DIAGNOSIS — Z7901 Long term (current) use of anticoagulants: Secondary | ICD-10-CM | POA: Diagnosis not present

## 2019-03-24 DIAGNOSIS — I099 Rheumatic heart disease, unspecified: Secondary | ICD-10-CM

## 2019-03-24 NOTE — Progress Notes (Signed)
Virtual Visit via Video Note   This visit type was conducted due to national recommendations for restrictions regarding the COVID-19 Pandemic (e.g. social distancing) in an effort to limit this patient's exposure and mitigate transmission in our community.  Due to her co-morbid illnesses, this patient is at least at moderate risk for complications without adequate follow up.  This format is felt to be most appropriate for this patient at this time.  All issues noted in this document were discussed and addressed.  A limited physical exam was performed with this format.  Please refer to the patient's chart for her consent to telehealth for [facility].  Evaluation Performed:  Follow-up visit  This visit type was conducted due to national recommendations for restrictions regarding the COVID-19 Pandemic (e.g. social distancing).  This format is felt to be most appropriate for this patient at this time.  All issues noted in this document were discussed and addressed.  No physical exam was performed (except for noted visual exam findings with Video Visits).  Please refer to the patient's chart (MyChart message for video visits and phone note for telephone visits) for the patient's consent to telehealth for [facility].  Date:  03/24/2019  ID: Toy Baker, DOB [birth year], MRN [id number]   Patient Location: [address] [place] [address]   Provider location:   [facility] [place] Office  PCP:  Street, Stephanie Coup, MD  Cardiologist:  Gypsy Balsam, MD     Chief Complaint: I am doing well  History of Present Illness:    Melody Torres is a 63 y.o. female  who presents via audio/video conferencing for a telehealth visit today.  Past medical history significant for rheumatic heart disease with severe mitral stenosis.  We are getting ready to evaluate her for a surgery however coronavirus situation happened and things were postponed now getting ready to look at her again.   She is scheduled to have a cardiac catheterization which will be left on the right on 10th of next month.  We talking length about what cardiac catheterization is how it will be done what I we doing this for.  Overall she is doing slightly worse she said she is getting more short of breath now walking around overall she can function do activities of daily living however shortness of breath became worse.  Denies having any palpitations no swelling of lower extremities no proximal nocturnal dyspnea.   The patient does not have symptoms concerning for COVID-19 infection (fever, chills, cough, or new SHORTNESS OF BREATH).    Prior CV studies:   The following studies were reviewed today:  TEE done on January 06, 2019 showed:   1. The right ventricle has normal systolc function. Right ventricular systolic pressure is moderately elevated.  2. Left atrial size was severely dilated.  3. Right atrial size was mildly dilated.  4. Moderately thickened tricuspid valve leaflets.  5. The mitral valve is rheumatic. Severe thickening of the mitral valve leaflet. Severe calcification of the mitral valve leaflet. Mitral valve regurgitation is severe by color flow Doppler. The MR jet is wall-impinging. Critical mitral valve stenosis.  6. Tricuspid valve regurgitation is moderate.  7. The left ventricle has hyperdynamic systolic function, with an ejection fraction of >65%.    Past Medical History:  Diagnosis Date   Diabetes mellitus without complication (HCC)    Hyperlipidemia     Past Surgical History:  Procedure Laterality Date   NO PAST SURGERIES  TEE WITHOUT CARDIOVERSION N/A 01/06/2019   Procedure: TRANSESOPHAGEAL ECHOCARDIOGRAM (TEE);  Surgeon: Lars Masson, MD;  Location: [facility] ENDOSCOPY;  Service: Cardiovascular;  Laterality: N/A;     Current Meds  Medication Sig   cetirizine (ZYRTEC) 10 MG tablet Take 10 mg by mouth daily.   Chromium (CHROMEMATE PO) Take 600 mg by mouth daily.     digoxin (DIGOX) 0.25 MG tablet Take 1 tablet (250 mcg total) by mouth daily.   dronedarone (MULTAQ) 400 MG tablet Take 1 tablet (400 mg total) by mouth 2 (two) times daily.   JANUVIA 100 MG tablet Take 100 mg by mouth daily.    omeprazole (PRILOSEC) 20 MG capsule Take 20 mg by mouth daily as needed (acid reflux).    pravastatin (PRAVACHOL) 40 MG tablet Take 1 tablet (40 mg total) by mouth daily with supper.   warfarin (COUMADIN) 5 MG tablet Take As Directed by Coumadin Clinic      Family History: The patient's family history includes Bladder Cancer in her mother; Diabetes in her father; Heart attack in her mother; Heart disease in her father; Hypothyroidism in her mother; Stroke in her father.   ROS:   Please see the history of present illness.     All other systems reviewed and are negative.   Labs/Other Tests and Data Reviewed:     Recent Labs: 01/04/2019: BUN 14; Creatinine, Ser 0.89; Hemoglobin 13.6; Platelets 182; Potassium 4.5; Sodium 141  Recent Lipid Panel    Component Value Date/Time   CHOL 140 04/29/2017 1656   TRIG 232 (H) 04/29/2017 1656   HDL 37 (L) 04/29/2017 1656   CHOLHDL 3.8 04/29/2017 1656   LDLCALC 57 04/29/2017 1656      Exam:    Vital Signs:  BP 118/78    Pulse 87    Wt 142 lb (64.4 kg)    BMI 24.37 kg/m     Wt Readings from Last 3 Encounters:  03/24/19 142 lb (64.4 kg)  02/21/19 142 lb (64.4 kg)  01/18/19 142 lb (64.4 kg)     Well nourished, well developed in no acute distress. Alert awake and attentive 3 happy to be able to talk to me over the video link denies having any chest pain tightness squeezing pressure burning chest abdomen went reviewed no swelling of lower extremities  Diagnosis for this visit:   1. Severe mitral valve stenosis   2. Paroxysmal atrial fibrillation (HCC)   3. Rheumatic heart disease   4. Anticoagulation adequate with anticoagulant therapy      ASSESSMENT & PLAN:    1.  Severe mitral valve stenosis.  Plan  is to proceed with left and right side cardiac catheterization to evaluate hemodynamics.  She most likely will require mitral valve replacement.  We talking length about cardiac catheterization that we talked about recovery time we talk about options in terms of surgery.  She is very knowledgeable about the problem.  I will send her to [facility] with for potential evaluation for balloon valvuloplasty however valve appears to be too significantly deformed and calcified in order to be successful balloon valvuloplasty.  However final decision in terms of what is needed will be made after cardiac catheterization. 2.  Paroxysmal atrial fibrillation denies having palpitations anticoagulation which I will continue. 3.  History of rheumatic fever.  Noted. 4.  Anticoagulation with Coumadin continue monitoring.  COVID-19 Education: The signs and symptoms of COVID-19 were discussed with the patient and how to seek care for testing (follow up  with PCP or arrange E-visit).  The importance of social distancing was discussed today.  Patient Risk:   After full review of this patients clinical status, I feel that they are at least moderate risk at this time.  Time:   Today, I have spent 25 minutes with the patient with telehealth technology discussing pt health issues.  I spent 5 minutes reviewing her chart before the visit.  Visit was finished at 10:50 AM.    Medication Adjustments/Labs and Tests Ordered: Current medicines are reviewed at length with the patient today.  Concerns regarding medicines are outlined above.  No orders of the defined types were placed in this encounter.  Medication changes: No orders of the defined types were placed in this encounter.    Disposition: Follow-up in 1 month  Signed, Georgeanna Leaobert J. Pius Byrom, MD, [facility] 03/24/2019 10:48 AM    [facility]

## 2019-03-24 NOTE — Patient Instructions (Signed)
Medication Instructions:  Your physician recommends that you continue on your current medications as directed. Please refer to the Current Medication list given to you today.  If you need a refill on your cardiac medications before your next appointment, please call your pharmacy.   Lab work: Your physician recommends that you return for lab work in 1 week: bmp, cbc, and pt/inr   If you have labs (blood work) drawn today and your tests are completely normal, you will receive your results only by: Marland Kitchen MyChart Message (if you have MyChart) OR . A paper copy in the mail If you have any lab test that is abnormal or we need to change your treatment, we will call you to review the results.  Testing/Procedures: A chest x-ray takes a picture of the organs and structures inside the chest, including the heart, lungs, and blood vessels. This test can show several things, including, whether the heart is enlarges; whether fluid is building up in the lungs; and whether pacemaker / defibrillator leads are still in place.     [facility] CARDIOVASCULAR DIVISION [facility] HEARTCARE AT [place] [address] [place] [address] Dept: [phone] Loc: [phone]  TAYTE CHILDERS  03/24/2019  You are scheduled for a Cardiac Catheterization on Wednesday, June 10 with Dr. Tonny Bollman.  1. Please arrive at the [facility] (Main Entrance A) at [facility]: [address] [place], [place] [address] at 6:30 AM (This time is two hours before your procedure to ensure your preparation). Free valet parking service is available.   Special note: Every effort is made to have your procedure done on time. Please understand that emergencies sometimes delay scheduled procedures.  2. Diet: Do not eat solid foods after midnight.  The patient may have clear liquids until 5am upon the day of the procedure.  3. Labs: You will need to have blood drawn in 1 week: BMP, CBC, Pt/INR    4. Medication instructions in preparation for your procedure:   Contrast Allergy: No  Hold: Januvia the day of the test.  * We will give more instruction on coumadin once we have your lab results.    On the morning of your procedure, take your Aspirin and any morning medicines NOT listed above.  You may use sips of water.  5. Plan for one night stay--bring personal belongings. 6. Bring a current list of your medications and current insurance cards. 7. You MUST have a responsible person to drive you home. 8. Someone MUST be with you the first 24 hours after you arrive home or your discharge will be delayed. 9. Please wear clothes that are easy to get on and off and wear slip-on shoes.  Thank you for allowing [place] to care for you!   --  Invasive Cardiovascular services   Follow-Up: At [facility], you and your health needs are our priority.  As part of our continuing mission to provide you with exceptional heart care, we have created designated Provider Care Teams.  These Care Teams include your primary Cardiologist (physician) and Advanced Practice Providers (APPs -  Physician Assistants and Nurse Practitioners) who all work together to provide you with the care you need, when you need it. You will need a follow up appointment in 1 months.  Please call our office 2 months in advance to schedule this appointment.  You may see Gypsy Balsam, MD or another member of our [facility] HeartCare Provider Team in : Norman Herrlich,  MD . Belva Crome, MD  Any Other Special Instructions Will Be Listed Below (If Applicable).   Coronary Angiogram With Stent Coronary angiogram with stent placement is a procedure to widen or open a narrow blood vessel of the heart (coronary artery). Arteries may become blocked by cholesterol buildup (plaques) in the lining of the wall. When a coronary artery becomes partially blocked, blood flow to that area decreases. This may lead to chest pain or a  heart attack (myocardial infarction). A stent is a small piece of metal that looks like mesh or a spring. Stent placement may be done as treatment for a heart attack or right after a coronary angiogram in which a blocked artery is found. Let your health care provider know about:  Any allergies you have.  All medicines you are taking, including vitamins, herbs, eye drops, creams, and over-the-counter medicines.  Any problems you or family members have had with anesthetic medicines.  Any blood disorders you have.  Any surgeries you have had.  Any medical conditions you have.  Whether you are pregnant or may be pregnant. What are the risks? Generally, this is a safe procedure. However, problems may occur, including:  Damage to the heart or its blood vessels.  A return of blockage.  Bleeding, infection, or bruising at the insertion site.  A collection of blood under the skin (hematoma) at the insertion site.  A blood clot in another part of the body.  Kidney injury.  Allergic reaction to the dye or contrast that is used.  Bleeding into the abdomen (retroperitoneal bleeding). What happens before the procedure? Staying hydrated Follow instructions from your health care provider about hydration, which may include:  Up to 2 hours before the procedure - you may continue to drink clear liquids, such as water, clear fruit juice, black coffee, and plain tea.  Eating and drinking restrictions Follow instructions from your health care provider about eating and drinking, which may include:  8 hours before the procedure - stop eating heavy meals or foods such as meat, fried foods, or fatty foods.  6 hours before the procedure - stop eating light meals or foods, such as toast or cereal.  2 hours before the procedure - stop drinking clear liquids. Ask your health care provider about:  Changing or stopping your regular medicines. This is especially important if you are taking diabetes  medicines or blood thinners.  Taking medicines such as ibuprofen. These medicines can thin your blood. Do not take these medicines before your procedure if your health care provider instructs you not to. Generally, aspirin is recommended before a procedure of passing a small, thin tube (catheter) through a blood vessel and into the heart (cardiac catheterization). What happens during the procedure?   An IV tube will be inserted into one of your veins.  You will be given one or more of the following: ? A medicine to help you relax (sedative). ? A medicine to numb the area where the catheter will be inserted into an artery (local anesthetic).  To reduce your risk of infection: ? Your health care team will wash or sanitize their hands. ? Your skin will be washed with soap. ? Hair may be removed from the area where the catheter will be inserted.  Using a guide wire, the catheter will be inserted into an artery. The location may be in your groin, in your wrist, or in the fold of your arm (near your elbow).  A type of X-ray (fluoroscopy) will  be used to help guide the catheter to the opening of the arteries in the heart.  A dye will be injected into the catheter, and X-rays will be taken. The dye will help to show where any narrowing or blockages are located in the arteries.  A tiny wire will be guided to the blocked spot, and a balloon will be inflated to make the artery wider.  The stent will be expanded and will crush the plaques into the wall of the vessel. The stent will hold the area open and improve the blood flow. Most stents have a drug coating to reduce the risk of the stent narrowing over time.  The artery may be made wider using a drill, laser, or other tools to remove plaques.  When the blood flow is better, the catheter will be removed. The lining of the artery will grow over the stent, which stays where it was placed. This procedure may vary among health care providers and  hospitals. What happens after the procedure?  If the procedure is done through the leg, you will be kept in bed lying flat for about 6 hours. You will be instructed to not bend and not cross your legs.  The insertion site will be checked frequently.  The pulse in your foot or wrist will be checked frequently.  You may have additional blood tests, X-rays, and a test that records the electrical activity of your heart (electrocardiogram, or ECG). This information is not intended to replace advice given to you by your health care provider. Make sure you discuss any questions you have with your health care provider. Document Released: 04/18/2003 Document Revised: 01/21/2018 Document Reviewed: 05/17/2016 Elsevier Interactive Patient Education  2019 [employer]

## 2019-03-24 NOTE — Addendum Note (Signed)
Addended by: Lita Mains on: 03/24/2019 11:36 AM   Modules accepted: Orders

## 2019-03-29 ENCOUNTER — Ambulatory Visit (INDEPENDENT_AMBULATORY_CARE_PROVIDER_SITE_OTHER): Payer: BC Managed Care – PPO | Admitting: Cardiology

## 2019-03-29 ENCOUNTER — Other Ambulatory Visit: Payer: Self-pay

## 2019-03-29 VITALS — BP 120/70 | HR 83 | Ht 64.0 in | Wt 145.0 lb

## 2019-03-29 DIAGNOSIS — J90 Pleural effusion, not elsewhere classified: Secondary | ICD-10-CM | POA: Diagnosis not present

## 2019-03-29 DIAGNOSIS — I517 Cardiomegaly: Secondary | ICD-10-CM | POA: Diagnosis not present

## 2019-03-29 DIAGNOSIS — I051 Rheumatic mitral insufficiency: Secondary | ICD-10-CM | POA: Diagnosis not present

## 2019-03-29 DIAGNOSIS — I480 Paroxysmal atrial fibrillation: Secondary | ICD-10-CM

## 2019-03-29 DIAGNOSIS — Z01812 Encounter for preprocedural laboratory examination: Secondary | ICD-10-CM | POA: Diagnosis not present

## 2019-03-29 DIAGNOSIS — I099 Rheumatic heart disease, unspecified: Secondary | ICD-10-CM | POA: Diagnosis not present

## 2019-03-29 DIAGNOSIS — Z7901 Long term (current) use of anticoagulants: Secondary | ICD-10-CM | POA: Diagnosis not present

## 2019-03-29 DIAGNOSIS — I050 Rheumatic mitral stenosis: Secondary | ICD-10-CM | POA: Diagnosis not present

## 2019-03-29 DIAGNOSIS — R918 Other nonspecific abnormal finding of lung field: Secondary | ICD-10-CM | POA: Diagnosis not present

## 2019-03-29 NOTE — H&P (View-Only) (Signed)
Cardiology Office Note:    Date:  03/29/2019   ID:  Melody Torres, DOB [birth year], MRN [id number]  PCP:  Street, Stephanie Coup, MD  Cardiologist:  Gypsy Balsam, MD    Referring MD: Street, Stephanie Coup, *   Chief Complaint  Patient presents with  . Prep for Cath  She is getting ready for cardiac catheterization  History of Present Illness:    Melody Torres is a 63 y.o. female with severe mitral stenosis which is rheumatic in etiology.  She is supposed to have a cardiac catheterization and potentially mitral valve replacement however procedure was postponed because of coronavirus situation.  Now we waited to do it.  She comes today to have laboratory test as well as EKG done.  I spent some time explained to her one more time we will plan to do why we doing this and what the potential outcomes.  She is eager to proceed with the procedure somewhat anxious which is understandable.  Past Medical History:  Diagnosis Date  . Diabetes mellitus without complication (HCC)   . Hyperlipidemia     Past Surgical History:  Procedure Laterality Date  . NO PAST SURGERIES    . TEE WITHOUT CARDIOVERSION N/A 01/06/2019   Procedure: TRANSESOPHAGEAL ECHOCARDIOGRAM (TEE);  Surgeon: Lars Masson, MD;  Location: [facility] ENDOSCOPY;  Service: Cardiovascular;  Laterality: N/A;    Current Medications: Current Meds  Medication Sig  . cetirizine (ZYRTEC) 10 MG tablet Take 10 mg by mouth daily.  . Chromium (CHROMEMATE PO) Take 600 mg by mouth daily.   . digoxin (DIGOX) 0.25 MG tablet Take 1 tablet (250 mcg total) by mouth daily.  Marland Kitchen dronedarone (MULTAQ) 400 MG tablet Take 1 tablet (400 mg total) by mouth 2 (two) times daily.  Marland Kitchen JANUVIA 100 MG tablet Take 100 mg by mouth daily.   Marland Kitchen omeprazole (PRILOSEC) 20 MG capsule Take 20 mg by mouth daily as needed (acid reflux).   . pravastatin (PRAVACHOL) 40 MG tablet Take 1 tablet (40 mg total) by mouth daily with supper.  . warfarin (COUMADIN) 5 MG  tablet Take As Directed by [facility]     Allergies:   Penicillins   Social History   Socioeconomic History  . Marital status: Widowed    Spouse name: Not on file  . Number of children: Not on file  . Years of education: Not on file  . Highest education level: Not on file  Occupational History  . Not on file  Social Needs  . Financial resource strain: Not on file  . Food insecurity:    Worry: Not on file    Inability: Not on file  . Transportation needs:    Medical: Not on file    Non-medical: Not on file  Tobacco Use  . Smoking status: Never Smoker  . Smokeless tobacco: Never Used  Substance and Sexual Activity  . Alcohol use: No  . Drug use: No  . Sexual activity: Not on file  Lifestyle  . Physical activity:    Days per week: Not on file    Minutes per session: Not on file  . Stress: Not on file  Relationships  . Social connections:    Talks on phone: Not on file    Gets together: Not on file    Attends religious service: Not on file    Active member of club or organization: Not on file    Attends meetings of clubs or organizations: Not on file  Relationship status: Not on file  Other Topics Concern  . Not on file  Social History Narrative  . Not on file     Family History: The patient's family history includes Bladder Cancer in her mother; Diabetes in her father; Heart attack in her mother; Heart disease in her father; Hypothyroidism in her mother; Stroke in her father. ROS:   Please see the history of present illness.    All 14 point review of systems negative except as described per history of present illness  EKGs/Labs/Other Studies Reviewed:      Recent Labs: 01/04/2019: BUN 14; Creatinine, Ser 0.89; Hemoglobin 13.6; Platelets 182; Potassium 4.5; Sodium 141  Recent Lipid Panel    Component Value Date/Time   CHOL 140 04/29/2017 1656   TRIG 232 (H) 04/29/2017 1656   HDL 37 (L) 04/29/2017 1656   CHOLHDL 3.8 04/29/2017 1656   LDLCALC 57  04/29/2017 1656    Physical Exam:    VS:  BP 120/70   Pulse 83   Ht 5' 4" (1.626 m)   Wt 145 lb (65.8 kg)   SpO2 98%   BMI 24.89 kg/m     Wt Readings from Last 3 Encounters:  03/29/19 145 lb (65.8 kg)  03/24/19 142 lb (64.4 kg)  02/21/19 142 lb (64.4 kg)     GEN:  Well nourished, well developed in no acute distress HEENT: Normal NECK: No JVD; No carotid bruits LYMPHATICS: No lymphadenopathy CARDIAC: RRR, systolic murmur grade 2/6 best heard in the apex, no rubs, no gallops RESPIRATORY:  Clear to auscultation without rales, wheezing or rhonchi  ABDOMEN: Soft, non-tender, non-distended MUSCULOSKELETAL:  No edema; No deformity  SKIN: Warm and dry LOWER EXTREMITIES: no swelling NEUROLOGIC:  Alert and oriented x 3 PSYCHIATRIC:  Normal affect   ASSESSMENT:    1. Paroxysmal atrial fibrillation (HCC)   2. Severe mitral valve stenosis   3. Rheumatic mitral regurgitation   4. Anticoagulation adequate with anticoagulant therapy    PLAN:    In order of problems listed above:  1. Atrial fibrillation paroxysmal.  Anticoagulated which I will continue. 2. Severe mitral valve stenosis.  Getting ready to have a cardiac catheterization procedure explained 1 more time including all risk benefits as well as alternatives. 3. Rheumatic mitral valve regurgitation.  Again cardiac catheterization will be done to assess the best possible way to fix her problem. 4. Anticoagulation continue   Medication Adjustments/Labs and Tests Ordered: Current medicines are reviewed at length with the patient today.  Concerns regarding medicines are outlined above.  Orders Placed This Encounter  Procedures  . EKG 12-Lead   Medication changes: No orders of the defined types were placed in this encounter.   Signed, Kabria Hetzer J. Dayshon Roback, MD, FACC 03/29/2019 4:31 PM    [facility] 

## 2019-03-29 NOTE — Patient Instructions (Signed)
Medication Instructions:  Your physician recommends that you continue on your current medications as directed. Please refer to the Current Medication list given to you today.  If you need a refill on your cardiac medications before your next appointment, please call your pharmacy.   Lab work: Your physician recommends that you return for lab work today: bmp, cbc, Pt-INr   If you have labs (blood work) drawn today and your tests are completely normal, you will receive your results only by: Marland Kitchen MyChart Message (if you have MyChart) OR . A paper copy in the mail If you have any lab test that is abnormal or we need to change your treatment, we will call you to review the results.  Testing/Procedures: A chest x-ray takes a picture of the organs and structures inside the chest, including the heart, lungs, and blood vessels. This test can show several things, including, whether the heart is enlarges; whether fluid is building up in the lungs; and whether pacemaker / defibrillator leads are still in place.    [facility] CARDIOVASCULAR DIVISION [facility] HEARTCARE AT [place] [address] [place] [address] Dept: [phone] Loc: [phone]  TALLY MATTOX  03/29/2019  You are scheduled for a Cardiac Catheterization on Wednesday, June 10 with Dr. Tonny Bollman.  1. Please arrive at the [facility] (Main Entrance A) at [facility]: [address] [place], [place] [address] at 6:30 AM (This time is two hours before your procedure to ensure your preparation). Free valet parking service is available.   Special note: Every effort is made to have your procedure done on time. Please understand that emergencies sometimes delay scheduled procedures.  2. Diet: Do not eat solid foods after midnight.  The patient may have clear liquids until 5am upon the day of the procedure.  3. Labs: You will need to have blood drawn in 1 week: BMP, CBC, Pt/INR   4.  Medication instructions in preparation for your procedure:   Contrast Allergy: No  Hold: Januvia the day of the test.  * We will give more instruction on coumadin once we have your lab results.    On the morning of your procedure, take your Aspirin and any morning medicines NOT listed above.  You may use sips of water.  5. Plan for one night stay--bring personal belongings. 6. Bring a current list of your medications and current insurance cards. 7. You MUST have a responsible person to drive you home. 8. Someone MUST be with you the first 24 hours after you arrive home or your discharge will be delayed. 9. Please wear clothes that are easy to get on and off and wear slip-on shoes.  Thank you for allowing [place] to care for you!   -- [place] Invasive Cardiovascular services    Follow-Up: Follow up as planned   Any Other Special Instructions Will Be Listed Below (If Applicable).    Coronary Angiogram With Stent Coronary angiogram with stent placement is a procedure to widen or open a narrow blood vessel of the heart (coronary artery). Arteries may become blocked by cholesterol buildup (plaques) in the lining of the wall. When a coronary artery becomes partially blocked, blood flow to that area decreases. This may lead to chest pain or a heart attack (myocardial infarction). A stent is a small piece of metal that looks like mesh or a spring. Stent placement may be done as treatment for a heart attack or right after a coronary angiogram  in which a blocked artery is found. Let your health care provider know about:  Any allergies you have.  All medicines you are taking, including vitamins, herbs, eye drops, creams, and over-the-counter medicines.  Any problems you or family members have had with anesthetic medicines.  Any blood disorders you have.  Any surgeries you have had.  Any medical conditions you have.  Whether you are pregnant or may be pregnant. What are the risks?  Generally, this is a safe procedure. However, problems may occur, including:  Damage to the heart or its blood vessels.  A return of blockage.  Bleeding, infection, or bruising at the insertion site.  A collection of blood under the skin (hematoma) at the insertion site.  A blood clot in another part of the body.  Kidney injury.  Allergic reaction to the dye or contrast that is used.  Bleeding into the abdomen (retroperitoneal bleeding). What happens before the procedure? Staying hydrated Follow instructions from your health care provider about hydration, which may include:  Up to 2 hours before the procedure - you may continue to drink clear liquids, such as water, clear fruit juice, black coffee, and plain tea.  Eating and drinking restrictions Follow instructions from your health care provider about eating and drinking, which may include:  8 hours before the procedure - stop eating heavy meals or foods such as meat, fried foods, or fatty foods.  6 hours before the procedure - stop eating light meals or foods, such as toast or cereal.  2 hours before the procedure - stop drinking clear liquids. Ask your health care provider about:  Changing or stopping your regular medicines. This is especially important if you are taking diabetes medicines or blood thinners.  Taking medicines such as ibuprofen. These medicines can thin your blood. Do not take these medicines before your procedure if your health care provider instructs you not to. Generally, aspirin is recommended before a procedure of passing a small, thin tube (catheter) through a blood vessel and into the heart (cardiac catheterization). What happens during the procedure?   An IV tube will be inserted into one of your veins.  You will be given one or more of the following: ? A medicine to help you relax (sedative). ? A medicine to numb the area where the catheter will be inserted into an artery (local anesthetic).   To reduce your risk of infection: ? Your health care team will wash or sanitize their hands. ? Your skin will be washed with soap. ? Hair may be removed from the area where the catheter will be inserted.  Using a guide wire, the catheter will be inserted into an artery. The location may be in your groin, in your wrist, or in the fold of your arm (near your elbow).  A type of X-ray (fluoroscopy) will be used to help guide the catheter to the opening of the arteries in the heart.  A dye will be injected into the catheter, and X-rays will be taken. The dye will help to show where any narrowing or blockages are located in the arteries.  A tiny wire will be guided to the blocked spot, and a balloon will be inflated to make the artery wider.  The stent will be expanded and will crush the plaques into the wall of the vessel. The stent will hold the area open and improve the blood flow. Most stents have a drug coating to reduce the risk of the stent narrowing over time.  The artery may be made wider using a drill, laser, or other tools to remove plaques.  When the blood flow is better, the catheter will be removed. The lining of the artery will grow over the stent, which stays where it was placed. This procedure may vary among health care providers and hospitals. What happens after the procedure?  If the procedure is done through the leg, you will be kept in bed lying flat for about 6 hours. You will be instructed to not bend and not cross your legs.  The insertion site will be checked frequently.  The pulse in your foot or wrist will be checked frequently.  You may have additional blood tests, X-rays, and a test that records the electrical activity of your heart (electrocardiogram, or ECG). This information is not intended to replace advice given to you by your health care provider. Make sure you discuss any questions you have with your health care provider. Document Released: 04/18/2003  Document Revised: 01/21/2018 Document Reviewed: 05/17/2016 Elsevier Interactive Patient Education  2019 [employer]

## 2019-03-29 NOTE — Progress Notes (Signed)
Cardiology Office Note:    Date:  03/29/2019   ID:  Melody Torres, DOB [birth year], MRN [id number]  PCP:  Street, Stephanie Coup, MD  Cardiologist:  Gypsy Balsam, MD    Referring MD: Street, Stephanie Coup, *   Chief Complaint  Patient presents with  . Prep for Cath  She is getting ready for cardiac catheterization  History of Present Illness:    Melody Torres is a 63 y.o. female with severe mitral stenosis which is rheumatic in etiology.  She is supposed to have a cardiac catheterization and potentially mitral valve replacement however procedure was postponed because of coronavirus situation.  Now we waited to do it.  She comes today to have laboratory test as well as EKG done.  I spent some time explained to her one more time we will plan to do why we doing this and what the potential outcomes.  She is eager to proceed with the procedure somewhat anxious which is understandable.  Past Medical History:  Diagnosis Date  . Diabetes mellitus without complication (HCC)   . Hyperlipidemia     Past Surgical History:  Procedure Laterality Date  . NO PAST SURGERIES    . TEE WITHOUT CARDIOVERSION N/A 01/06/2019   Procedure: TRANSESOPHAGEAL ECHOCARDIOGRAM (TEE);  Surgeon: Lars Masson, MD;  Location: [facility] ENDOSCOPY;  Service: Cardiovascular;  Laterality: N/A;    Current Medications: Current Meds  Medication Sig  . cetirizine (ZYRTEC) 10 MG tablet Take 10 mg by mouth daily.  . Chromium (CHROMEMATE PO) Take 600 mg by mouth daily.   . digoxin (DIGOX) 0.25 MG tablet Take 1 tablet (250 mcg total) by mouth daily.  Marland Kitchen dronedarone (MULTAQ) 400 MG tablet Take 1 tablet (400 mg total) by mouth 2 (two) times daily.  Marland Kitchen JANUVIA 100 MG tablet Take 100 mg by mouth daily.   Marland Kitchen omeprazole (PRILOSEC) 20 MG capsule Take 20 mg by mouth daily as needed (acid reflux).   . pravastatin (PRAVACHOL) 40 MG tablet Take 1 tablet (40 mg total) by mouth daily with supper.  . warfarin (COUMADIN) 5 MG  tablet Take As Directed by [facility]     Allergies:   Penicillins   Social History   Socioeconomic History  . Marital status: Widowed    Spouse name: Not on file  . Number of children: Not on file  . Years of education: Not on file  . Highest education level: Not on file  Occupational History  . Not on file  Social Needs  . Financial resource strain: Not on file  . Food insecurity:    Worry: Not on file    Inability: Not on file  . Transportation needs:    Medical: Not on file    Non-medical: Not on file  Tobacco Use  . Smoking status: Never Smoker  . Smokeless tobacco: Never Used  Substance and Sexual Activity  . Alcohol use: No  . Drug use: No  . Sexual activity: Not on file  Lifestyle  . Physical activity:    Days per week: Not on file    Minutes per session: Not on file  . Stress: Not on file  Relationships  . Social connections:    Talks on phone: Not on file    Gets together: Not on file    Attends religious service: Not on file    Active member of club or organization: Not on file    Attends meetings of clubs or organizations: Not on file  Relationship status: Not on file  Other Topics Concern  . Not on file  Social History Narrative  . Not on file     Family History: The patient's family history includes Bladder Cancer in her mother; Diabetes in her father; Heart attack in her mother; Heart disease in her father; Hypothyroidism in her mother; Stroke in her father. ROS:   Please see the history of present illness.    All 14 point review of systems negative except as described per history of present illness  EKGs/Labs/Other Studies Reviewed:      Recent Labs: 01/04/2019: BUN 14; Creatinine, Ser 0.89; Hemoglobin 13.6; Platelets 182; Potassium 4.5; Sodium 141  Recent Lipid Panel    Component Value Date/Time   CHOL 140 04/29/2017 1656   TRIG 232 (H) 04/29/2017 1656   HDL 37 (L) 04/29/2017 1656   CHOLHDL 3.8 04/29/2017 1656   LDLCALC 57  04/29/2017 1656    Physical Exam:    VS:  BP 120/70   Pulse 83   Ht 5\' 4"  (1.626 m)   Wt 145 lb (65.8 kg)   SpO2 98%   BMI 24.89 kg/m     Wt Readings from Last 3 Encounters:  03/29/19 145 lb (65.8 kg)  03/24/19 142 lb (64.4 kg)  02/21/19 142 lb (64.4 kg)     GEN:  Well nourished, well developed in no acute distress HEENT: Normal NECK: No JVD; No carotid bruits LYMPHATICS: No lymphadenopathy CARDIAC: RRR, systolic murmur grade 2/6 best heard in the apex, no rubs, no gallops RESPIRATORY:  Clear to auscultation without rales, wheezing or rhonchi  ABDOMEN: Soft, non-tender, non-distended MUSCULOSKELETAL:  No edema; No deformity  SKIN: Warm and dry LOWER EXTREMITIES: no swelling NEUROLOGIC:  Alert and oriented x 3 PSYCHIATRIC:  Normal affect   ASSESSMENT:    1. Paroxysmal atrial fibrillation (HCC)   2. Severe mitral valve stenosis   3. Rheumatic mitral regurgitation   4. Anticoagulation adequate with anticoagulant therapy    PLAN:    In order of problems listed above:  1. Atrial fibrillation paroxysmal.  Anticoagulated which I will continue. 2. Severe mitral valve stenosis.  Getting ready to have a cardiac catheterization procedure explained 1 more time including all risk benefits as well as alternatives. 3. Rheumatic mitral valve regurgitation.  Again cardiac catheterization will be done to assess the best possible way to fix her problem. 4. Anticoagulation continue   Medication Adjustments/Labs and Tests Ordered: Current medicines are reviewed at length with the patient today.  Concerns regarding medicines are outlined above.  Orders Placed This Encounter  Procedures  . EKG 12-Lead   Medication changes: No orders of the defined types were placed in this encounter.   Signed, Georgeanna Leaobert J. Krasowski, MD, [facility] CenterFACC 03/29/2019 4:31 PM     [facility]

## 2019-03-30 ENCOUNTER — Telehealth: Payer: Self-pay

## 2019-03-30 LAB — BASIC METABOLIC PANEL
BUN/Creatinine Ratio: 18 (ref 12–28)
BUN: 17 mg/dL (ref 8–27)
CO2: 24 mmol/L (ref 20–29)
Calcium: 9.3 mg/dL (ref 8.7–10.3)
Chloride: 102 mmol/L (ref 96–106)
Creatinine, Ser: 0.95 mg/dL (ref 0.57–1.00)
GFR calc Af Amer: 73 mL/min/{1.73_m2} (ref >59)
GFR calc non Af Amer: 63 mL/min/{1.73_m2} (ref >59)
Glucose: 116 mg/dL — ABNORMAL HIGH (ref 65–99)
Potassium: 4.4 mmol/L (ref 3.5–5.2)
Sodium: 143 mmol/L (ref 134–144)

## 2019-03-30 LAB — PROTIME-INR
INR: 3 — ABNORMAL HIGH (ref 0.8–1.2)
Prothrombin Time: 29.7 s — ABNORMAL HIGH (ref 9.1–12.0)

## 2019-03-30 LAB — CBC
Hematocrit: 40.6 % (ref 34.0–46.6)
Hemoglobin: 13.5 g/dL (ref 11.1–15.9)
MCH: 30.2 pg (ref 26.6–33.0)
MCHC: 33.3 g/dL (ref 31.5–35.7)
MCV: 91 fL (ref 79–97)
Platelets: 193 10*3/uL (ref 150–450)
RBC: 4.47 x10E6/uL (ref 3.77–5.28)
RDW: 12.9 % (ref 11.7–15.4)
WBC: 8.7 10*3/uL (ref 3.4–10.8)

## 2019-03-30 NOTE — Telephone Encounter (Signed)

## 2019-03-31 ENCOUNTER — Other Ambulatory Visit: Payer: Self-pay

## 2019-03-31 ENCOUNTER — Other Ambulatory Visit (HOSPITAL_COMMUNITY)
Admission: RE | Admit: 2019-03-31 | Discharge: 2019-03-31 | Disposition: A | Discharge status: Home / Self Care | Payer: BC Managed Care – PPO | Source: Ambulatory Visit | Attending: Cardiovascular Disease | Admitting: Cardiovascular Disease

## 2019-03-31 DIAGNOSIS — Z1159 Encounter for screening for other viral diseases: Secondary | ICD-10-CM | POA: Diagnosis not present

## 2019-03-31 DIAGNOSIS — Z01812 Encounter for preprocedural laboratory examination: Secondary | ICD-10-CM | POA: Insufficient documentation

## 2019-04-01 LAB — NOVEL CORONAVIRUS, NAA (HOSP ORDER, SEND-OUT TO REF LAB; TAT 18-24 HRS): SARS-CoV-2, NAA: NOT DETECTED

## 2019-04-03 ENCOUNTER — Telehealth: Payer: Self-pay | Admitting: Emergency Medicine

## 2019-04-03 DIAGNOSIS — Z7901 Long term (current) use of anticoagulants: Secondary | ICD-10-CM | POA: Diagnosis not present

## 2019-04-03 LAB — PROTIME-INR
INR: 3.1 — ABNORMAL HIGH (ref 0.8–1.2)
Prothrombin Time: 30.3 s — ABNORMAL HIGH (ref 9.1–12.0)

## 2019-04-03 NOTE — Telephone Encounter (Addendum)
Called patient to inform her to hold coumadin for now until further instruction per Dr. Agustin Cree in preparation for her heart cath based on INR results today. Patient verbally understands.   Desiree Lucy, RN reached out regarding the patient supposed to be under self-quarantine after being test for COVID 19 and was concerned that since she came to the office today for labs this may cause her to need to be retested. She checked on this and was told as long as the patient had not been anywhere else she was fine and didn't have to be retested. I asked to confirm with the patient that she hadn't been anywhere but besides our office today for labs she reports that she has been to her sons house, her sisters house and outside on her land. She reports this is nothing new for her the past couple weeks as they have all been around each other. Desiree Lucy, RN will check on this to determine if she needs to be retested and let patient know. Patient aware.   Since INR is 3.1 today the patient 's cath for Wednesday needs to be rescheduled per Dr. Agustin Cree. Patient has been rescheduled Friday 04/07/2019 with Dr. Ellyn Hack. The patient is aware. We will plan for her to have pt inr rechecked on Wednesday. Patient aware of all information will reach back out tomorrow and confirm with her when to come in for inr recheck.   No further questions.

## 2019-04-03 NOTE — Telephone Encounter (Signed)
Called patient and informed her to have labs drawn for Pt/INR today pre-cath. Patient will have stat labs drawn today in [place] office.

## 2019-04-04 ENCOUNTER — Other Ambulatory Visit: Payer: Self-pay

## 2019-04-04 ENCOUNTER — Other Ambulatory Visit (HOSPITAL_COMMUNITY)
Admission: RE | Admit: 2019-04-04 | Discharge: 2019-04-04 | Disposition: A | Discharge status: Home / Self Care | Payer: BC Managed Care – PPO | Source: Ambulatory Visit | Attending: Cardiology | Admitting: Cardiology

## 2019-04-04 DIAGNOSIS — Z1159 Encounter for screening for other viral diseases: Secondary | ICD-10-CM | POA: Diagnosis not present

## 2019-04-04 DIAGNOSIS — Z01812 Encounter for preprocedural laboratory examination: Secondary | ICD-10-CM | POA: Insufficient documentation

## 2019-04-04 NOTE — Telephone Encounter (Signed)
Called patient and informed her to have covid retest today and to go straight home and stay home. Patient also advised to come to office in [facility] tomorrow for repeat INR wearing a mask and return home right after. Patient verbally understands.

## 2019-04-04 NOTE — Addendum Note (Signed)
Addended by: Ashok Norris on: 04/04/2019 01:46 PM   Modules accepted: Orders

## 2019-04-05 ENCOUNTER — Telehealth: Payer: Self-pay | Admitting: Emergency Medicine

## 2019-04-05 DIAGNOSIS — Z7901 Long term (current) use of anticoagulants: Secondary | ICD-10-CM | POA: Diagnosis not present

## 2019-04-05 LAB — PROTIME-INR
INR: 1.7 — ABNORMAL HIGH (ref 0.8–1.2)
Prothrombin Time: 16.6 s — ABNORMAL HIGH (ref 9.1–12.0)

## 2019-04-05 LAB — NOVEL CORONAVIRUS, NAA (HOSP ORDER, SEND-OUT TO REF LAB; TAT 18-24 HRS): SARS-CoV-2, NAA: NOT DETECTED

## 2019-04-05 MED ORDER — ENOXAPARIN SODIUM 30 MG/0.3ML ~~LOC~~ SOLN: 65.8000 mg | Freq: Once | SUBCUTANEOUS | 0 refills | Status: DC

## 2019-04-05 NOTE — Telephone Encounter (Signed)
Called patient informed her of her results from inr today at 1.7. Per Dr. Agustin Cree patient advised to take lovenox 1 dose today and tomorrow (65.8 mg) subq. Patient verbally understands. Rx sent to [facility] as patient requested. Patient will have someone pick up medication and bring to her. No further questions at this time.

## 2019-04-06 ENCOUNTER — Telehealth: Payer: Self-pay | Admitting: *Deleted

## 2019-04-06 NOTE — Telephone Encounter (Signed)
Pt contacted pre-catheterization scheduled at [facility] for: Friday April 07, 2019 7:30 AM Verified arrival time and place: [place] Entr[facility] A at: 5:30 AM  Covid-19 test date: 04/04/19   No solid food after midnight prior to cath, clear liquids until 5 AM day of procedure. Contrast allergy: no  Hold: Januvia-AM of procedure. Coumadin-last dose 04/02/19  Except hold medications AM meds can be  taken pre-cath with sip of water including: ASA 81 mg  Pt aware she cannot drive home and someone should be with her to observe 24 hours at home.  Due to Covid-19 pandemic no visitors are allowed in the hospital (unless cognitive impairment).  Their designated party will be called when their procedure is over for an update and to arrange pick up.  Patients are required to wear a mask when they enter the hospital.       COVID-19 Pre-Screening Questions:  . In the past 7 to 10 days have you had a cough,  shortness of breath, headache, congestion, fever (100 or greater) body aches, chills, sore throat, or sudden loss of taste or sense of smell? no . Have you been around anyone with known Covid 19? no . Have you been around anyone who is awaiting Covid 19 test results in the past 7 to 10 days? no . Have you been around anyone who has been exposed to Covid 19, or has mentioned symptoms of Covid 19 within the past 7 to 10 days? no  I reviewed procedure instructions/mask/visitor/Covid -19 screening questions with patient, she verbalized understanding.

## 2019-04-07 ENCOUNTER — Encounter (HOSPITAL_COMMUNITY): Payer: Self-pay | Admitting: Cardiology

## 2019-04-07 ENCOUNTER — Ambulatory Visit (HOSPITAL_COMMUNITY)
Admission: RE | Admit: 2019-04-07 | Discharge: 2019-04-07 | Disposition: A | Discharge status: Home / Self Care | Payer: BC Managed Care – PPO | Attending: Cardiology | Admitting: Cardiology

## 2019-04-07 ENCOUNTER — Encounter (HOSPITAL_COMMUNITY)
Admission: RE | Disposition: A | Discharge status: Home / Self Care | Payer: Self-pay | Source: Home / Self Care | Attending: Cardiology

## 2019-04-07 ENCOUNTER — Other Ambulatory Visit: Payer: Self-pay

## 2019-04-07 DIAGNOSIS — Z7901 Long term (current) use of anticoagulants: Secondary | ICD-10-CM | POA: Insufficient documentation

## 2019-04-07 DIAGNOSIS — I051 Rheumatic mitral insufficiency: Secondary | ICD-10-CM | POA: Diagnosis not present

## 2019-04-07 DIAGNOSIS — Z7984 Long term (current) use of oral hypoglycemic drugs: Secondary | ICD-10-CM | POA: Insufficient documentation

## 2019-04-07 DIAGNOSIS — I480 Paroxysmal atrial fibrillation: Secondary | ICD-10-CM

## 2019-04-07 DIAGNOSIS — I099 Rheumatic heart disease, unspecified: Secondary | ICD-10-CM | POA: Diagnosis not present

## 2019-04-07 DIAGNOSIS — I2510 Atherosclerotic heart disease of native coronary artery without angina pectoris: Secondary | ICD-10-CM | POA: Insufficient documentation

## 2019-04-07 DIAGNOSIS — I050 Rheumatic mitral stenosis: Secondary | ICD-10-CM

## 2019-04-07 DIAGNOSIS — I272 Pulmonary hypertension, unspecified: Secondary | ICD-10-CM | POA: Diagnosis not present

## 2019-04-07 DIAGNOSIS — E785 Hyperlipidemia, unspecified: Secondary | ICD-10-CM | POA: Diagnosis not present

## 2019-04-07 DIAGNOSIS — Z79899 Other long term (current) drug therapy: Secondary | ICD-10-CM | POA: Insufficient documentation

## 2019-04-07 DIAGNOSIS — Z880 Allergy status to penicillin: Secondary | ICD-10-CM | POA: Diagnosis not present

## 2019-04-07 DIAGNOSIS — Z1211 Encounter for screening for malignant neoplasm of colon: Secondary | ICD-10-CM

## 2019-04-07 DIAGNOSIS — E119 Type 2 diabetes mellitus without complications: Secondary | ICD-10-CM | POA: Diagnosis not present

## 2019-04-07 DIAGNOSIS — Z01810 Encounter for preprocedural cardiovascular examination: Secondary | ICD-10-CM

## 2019-04-07 HISTORY — PX: RIGHT/LEFT HEART CATH AND CORONARY ANGIOGRAPHY: CATH118266

## 2019-04-07 LAB — POCT I-STAT EG7
Bicarbonate: 25.9 mmol/L (ref 20.0–28.0)
Bicarbonate: 26.6 mmol/L (ref 20.0–28.0)
Calcium, Ion: 1.23 mmol/L (ref 1.15–1.40)
Calcium, Ion: 1.23 mmol/L (ref 1.15–1.40)
HCT: 38 % (ref 36.0–46.0)
HCT: 38 % (ref 36.0–46.0)
Hemoglobin: 12.9 g/dL (ref 12.0–15.0)
Hemoglobin: 12.9 g/dL (ref 12.0–15.0)
O2 Saturation: 67 %
O2 Saturation: 67 %
Potassium: 4.2 mmol/L (ref 3.5–5.1)
Potassium: 4.2 mmol/L (ref 3.5–5.1)
Sodium: 143 mmol/L (ref 135–145)
Sodium: 143 mmol/L (ref 135–145)
TCO2: 27 mmol/L (ref 22–32)
TCO2: 28 mmol/L (ref 22–32)
pCO2, Ven: 48.1 mmHg (ref 44.0–60.0)
pCO2, Ven: 49.4 mmHg (ref 44.0–60.0)
pH, Ven: 7.339 (ref 7.250–7.430)
pH, Ven: 7.339 (ref 7.250–7.430)
pO2, Ven: 37 mmHg (ref 32.0–45.0)
pO2, Ven: 38 mmHg (ref 32.0–45.0)

## 2019-04-07 LAB — POCT I-STAT 7, (LYTES, BLD GAS, ICA,H+H)
Acid-base deficit: 1 mmol/L (ref 0.0–2.0)
Bicarbonate: 24.3 mmol/L (ref 20.0–28.0)
Calcium, Ion: 1.22 mmol/L (ref 1.15–1.40)
HCT: 37 % (ref 36.0–46.0)
Hemoglobin: 12.6 g/dL (ref 12.0–15.0)
O2 Saturation: 97 %
Potassium: 4.2 mmol/L (ref 3.5–5.1)
Sodium: 142 mmol/L (ref 135–145)
TCO2: 26 mmol/L (ref 22–32)
pCO2 arterial: 41 mmHg (ref 32.0–48.0)
pH, Arterial: 7.38 (ref 7.350–7.450)
pO2, Arterial: 93 mmHg (ref 83.0–108.0)

## 2019-04-07 LAB — PROTIME-INR
INR: 1.2 (ref 0.8–1.2)
Prothrombin Time: 15.1 seconds (ref 11.4–15.2)

## 2019-04-07 LAB — GLUCOSE, CAPILLARY: Glucose-Capillary: 106 mg/dL — ABNORMAL HIGH (ref 70–99)

## 2019-04-07 SURGERY — RIGHT/LEFT HEART CATH AND CORONARY ANGIOGRAPHY
Anesthesia: LOCAL

## 2019-04-07 MED ORDER — FENTANYL CITRATE (PF) 100 MCG/2ML IJ SOLN
INTRAMUSCULAR | Status: DC | PRN
  Administered 2019-04-07: 25 ug via INTRAVENOUS

## 2019-04-07 MED ORDER — ACETAMINOPHEN 325 MG PO TABS: 650.0000 mg | ORAL_TABLET | ORAL | Status: DC | PRN

## 2019-04-07 MED ORDER — HEPARIN SODIUM (PORCINE) 1000 UNIT/ML IJ SOLN
INTRAMUSCULAR | Status: DC | PRN
  Administered 2019-04-07: 3500 [IU] via INTRAVENOUS

## 2019-04-07 MED ORDER — MIDAZOLAM HCL 2 MG/2ML IJ SOLN
INTRAMUSCULAR | Status: AC
  Filled 2019-04-07: qty 2

## 2019-04-07 MED ORDER — SODIUM CHLORIDE 0.9% FLUSH: 3.0000 mL | INTRAVENOUS | Status: DC | PRN

## 2019-04-07 MED ORDER — VERAPAMIL HCL 2.5 MG/ML IV SOLN
INTRAVENOUS | Status: AC
  Filled 2019-04-07: qty 2

## 2019-04-07 MED ORDER — HEPARIN (PORCINE) IN NACL 1000-0.9 UT/500ML-% IV SOLN
INTRAVENOUS | Status: DC | PRN
  Administered 2019-04-07 (×2): 500 mL

## 2019-04-07 MED ORDER — ASPIRIN 81 MG PO CHEW: 81.0000 mg | CHEWABLE_TABLET | ORAL | Status: DC

## 2019-04-07 MED ORDER — VERAPAMIL HCL 2.5 MG/ML IV SOLN
INTRAVENOUS | Status: DC | PRN
  Administered 2019-04-07: 10 mL via INTRA_ARTERIAL

## 2019-04-07 MED ORDER — MIDAZOLAM HCL 2 MG/2ML IJ SOLN
INTRAMUSCULAR | Status: DC | PRN
  Administered 2019-04-07: 1 mg via INTRAVENOUS

## 2019-04-07 MED ORDER — SODIUM CHLORIDE 0.9 % IV SOLN: 250.0000 mL | INTRAVENOUS | Status: DC | PRN

## 2019-04-07 MED ORDER — SODIUM CHLORIDE 0.9 % IV SOLN: INTRAVENOUS | Status: DC

## 2019-04-07 MED ORDER — SODIUM CHLORIDE 0.9% FLUSH: 3.0000 mL | Freq: Two times a day (BID) | INTRAVENOUS | Status: DC

## 2019-04-07 MED ORDER — SODIUM CHLORIDE 0.9 % WEIGHT BASED INFUSION
3.0000 mL/kg/h | INTRAVENOUS | Status: DC
  Administered 2019-04-07: 3 mL/kg/h via INTRAVENOUS

## 2019-04-07 MED ORDER — SODIUM CHLORIDE 0.9 % WEIGHT BASED INFUSION: 1.0000 mL/kg/h | INTRAVENOUS | Status: DC

## 2019-04-07 MED ORDER — IOHEXOL 350 MG/ML SOLN
INTRAVENOUS | Status: DC | PRN
  Administered 2019-04-07: 40 mL via INTRA_ARTERIAL

## 2019-04-07 MED ORDER — FENTANYL CITRATE (PF) 100 MCG/2ML IJ SOLN
INTRAMUSCULAR | Status: AC
  Filled 2019-04-07: qty 2

## 2019-04-07 MED ORDER — LIDOCAINE HCL (PF) 1 % IJ SOLN
INTRAMUSCULAR | Status: DC | PRN
  Administered 2019-04-07: 3 mL

## 2019-04-07 MED ORDER — ONDANSETRON HCL 4 MG/2ML IJ SOLN: 4.0000 mg | Freq: Four times a day (QID) | INTRAMUSCULAR | Status: DC | PRN

## 2019-04-07 MED ORDER — LIDOCAINE HCL (PF) 1 % IJ SOLN
INTRAMUSCULAR | Status: AC
  Filled 2019-04-07: qty 30

## 2019-04-07 MED ORDER — HEPARIN (PORCINE) IN NACL 1000-0.9 UT/500ML-% IV SOLN
INTRAVENOUS | Status: AC
  Filled 2019-04-07: qty 1000

## 2019-04-07 SURGICAL SUPPLY — 13 items
CATH BALLN WEDGE 5F 110CM (CATHETERS) ×2 IMPLANT
CATH INFINITI 5FR ANG PIGTAIL (CATHETERS) ×2 IMPLANT
CATH OPTITORQUE TIG 4.0 5F (CATHETERS) ×2 IMPLANT
DEVICE RAD COMP TR BAND LRG (VASCULAR PRODUCTS) ×2 IMPLANT
GLIDESHEATH SLEND SS 6F .021 (SHEATH) ×2 IMPLANT
GUIDEWIRE INQWIRE 1.5J.035X260 (WIRE) ×1 IMPLANT
INQWIRE 1.5J .035X260CM (WIRE) ×2
KIT HEART LEFT (KITS) ×2 IMPLANT
PACK CARDIAC CATHETERIZATION (CUSTOM PROCEDURE TRAY) ×2 IMPLANT
SHEATH GLIDE SLENDER 4/5FR (SHEATH) ×2 IMPLANT
SHEATH PROBE COVER 6X72 (BAG) ×2 IMPLANT
TRANSDUCER W/STOPCOCK (MISCELLANEOUS) ×2 IMPLANT
TUBING CIL FLEX 10 FLL-RA (TUBING) ×2 IMPLANT

## 2019-04-07 NOTE — Progress Notes (Signed)
Discharge instructions reviewed with pt and her sister Joseph Art (via Telephone)  Voices understanding.

## 2019-04-07 NOTE — Progress Notes (Signed)
Pt informed to restart coumadin tomorrow per Dory Peru, NP pt voices understanding

## 2019-04-07 NOTE — Discharge Instructions (Signed)
Drink plenty of fluids  °Keep right arm at or above heart level.  °Radial Site Care ° °This sheet gives you information about how to care for yourself after your procedure. Your health care provider may also give you more specific instructions. If you have problems or questions, contact your health care provider. °What can I expect after the procedure? °After the procedure, it is common to have: °· Bruising and tenderness at the catheter insertion area. °Follow these instructions at home: °Medicines °· Take over-the-counter and prescription medicines only as told by your health care provider. °Insertion site care °· Follow instructions from your health care provider about how to take care of your insertion site. Make sure you: °? Wash your hands with soap and water before you change your bandage (dressing). If soap and water are not available, use hand sanitizer. °? Change your dressing as told by your health care provider. °? Leave stitches (sutures), skin glue, or adhesive strips in place. These skin closures may need to stay in place for 2 weeks or longer. If adhesive strip edges start to loosen and curl up, you may trim the loose edges. Do not remove adhesive strips completely unless your health care provider tells you to do that. °· Check your insertion site every day for signs of infection. Check for: °? Redness, swelling, or pain. °? Fluid or blood. °? Pus or a bad smell. °? Warmth. °· Do not take baths, swim, or use a hot tub until your health care provider approves. °· You may shower 24-48 hours after the procedure, or as directed by your health care provider. °? Remove the dressing and gently wash the site with plain soap and water. °? Pat the area dry with a clean towel. °? Do not rub the site. That could cause bleeding. °· Do not apply powder or lotion to the site. °Activity ° °· For 24 hours after the procedure, or as directed by your health care provider: °? Do not flex or bend the affected arm. °? Do  not push or pull heavy objects with the affected arm. °? Do not drive yourself home from the hospital or clinic. You may drive 24 hours after the procedure unless your health care provider tells you not to. °? Do not operate machinery or power tools. °· Do not lift anything that is heavier than 10 lb (4.5 kg), or the limit that you are told, until your health care provider says that it is safe. °· Ask your health care provider when it is okay to: °? Return to work or school. °? Resume usual physical activities or sports. °? Resume sexual activity. °General instructions °· If the catheter site starts to bleed, raise your arm and put firm pressure on the site. If the bleeding does not stop, get help right away. This is a medical emergency. °· If you went home on the same day as your procedure, a responsible adult should be with you for the first 24 hours after you arrive home. °· Keep all follow-up visits as told by your health care provider. This is important. °Contact a health care provider if: °· You have a fever. °· You have redness, swelling, or yellow drainage around your insertion site. °Get help right away if: °· You have unusual pain at the radial site. °· The catheter insertion area swells very fast. °· The insertion area is bleeding, and the bleeding does not stop when you hold steady pressure on the area. °· Your arm or   hand becomes pale, cool, tingly, or numb. °These symptoms may represent a serious problem that is an emergency. Do not wait to see if the symptoms will go away. Get medical help right away. Call your local emergency services (911 in the [place] Do not drive yourself to the hospital. °Summary °· After the procedure, it is common to have bruising and tenderness at the site. °· Follow instructions from your health care provider about how to take care of your radial site wound. Check the wound every day for signs of infection. °· Do not lift anything that is heavier than 10 lb (4.5 kg), or the  limit that you are told, until your health care provider says that it is safe. °This information is not intended to replace advice given to you by your health care provider. Make sure you discuss any questions you have with your health care provider. °Document Released: 11/14/2010 Document Revised: 11/17/2017 Document Reviewed: 11/17/2017 °Elsevier Interactive Patient Education © 2019 [employer] ° °

## 2019-04-07 NOTE — Interval H&P Note (Signed)
History and Physical Interval Note:  04/07/2019 7:28 AM  Melody Torres  has presented today for surgery, with the diagnosis of [place] valve stenosis.   The various methods of treatment have been discussed with the patient and family. After consideration of risks, benefits and other options for treatment, the patient has consented to  Procedure(s): RIGHT/LEFT HEART CATH AND CORONARY ANGIOGRAPHY (N/A) as a surgical intervention.    The patient's history has been reviewed, patient examined, no change in status, stable for surgery.  I have reviewed the patient's chart and labs.  Questions were answered to the patient's satisfaction.     Glenetta Hew

## 2019-04-10 ENCOUNTER — Encounter (HOSPITAL_COMMUNITY): Payer: Self-pay | Admitting: Cardiology

## 2019-04-11 ENCOUNTER — Other Ambulatory Visit: Payer: Self-pay

## 2019-04-11 ENCOUNTER — Telehealth: Payer: Self-pay | Admitting: Emergency Medicine

## 2019-04-11 ENCOUNTER — Telehealth: Payer: Self-pay

## 2019-04-11 ENCOUNTER — Ambulatory Visit (INDEPENDENT_AMBULATORY_CARE_PROVIDER_SITE_OTHER): Payer: BC Managed Care – PPO | Admitting: *Deleted

## 2019-04-11 DIAGNOSIS — Z7901 Long term (current) use of anticoagulants: Secondary | ICD-10-CM | POA: Diagnosis not present

## 2019-04-11 DIAGNOSIS — I480 Paroxysmal atrial fibrillation: Secondary | ICD-10-CM

## 2019-04-11 DIAGNOSIS — Z5181 Encounter for therapeutic drug level monitoring: Secondary | ICD-10-CM

## 2019-04-11 DIAGNOSIS — I099 Rheumatic heart disease, unspecified: Secondary | ICD-10-CM

## 2019-04-11 LAB — POCT INR: INR: 1.4 — AB (ref 2.0–3.0)

## 2019-04-11 MED ORDER — ENOXAPARIN SODIUM 60 MG/0.6ML ~~LOC~~ SOLN: 60.0000 mg | Freq: Two times a day (BID) | SUBCUTANEOUS | 0 refills | Status: DC

## 2019-04-11 NOTE — Telephone Encounter (Signed)
Called patient informed her to have inr checked today per Dr. Agustin Cree.

## 2019-04-11 NOTE — Patient Instructions (Signed)
Description   Today take 15mg  and tomorrow take 10mg , then resume same dosage 7.5 mg every day. Recheck INR in 1 week. Take 60mg  Lovenox injection every 12 hours today and tomorrow into fatty abdominal tissue, rotate sites.  Call the office with any medication changes or additions [phone].

## 2019-04-11 NOTE — Telephone Encounter (Signed)

## 2019-04-18 ENCOUNTER — Ambulatory Visit (INDEPENDENT_AMBULATORY_CARE_PROVIDER_SITE_OTHER): Payer: BC Managed Care – PPO | Admitting: *Deleted

## 2019-04-18 ENCOUNTER — Other Ambulatory Visit: Payer: Self-pay

## 2019-04-18 ENCOUNTER — Telehealth: Payer: Self-pay | Admitting: Emergency Medicine

## 2019-04-18 DIAGNOSIS — I480 Paroxysmal atrial fibrillation: Secondary | ICD-10-CM

## 2019-04-18 DIAGNOSIS — Z5181 Encounter for therapeutic drug level monitoring: Secondary | ICD-10-CM

## 2019-04-18 DIAGNOSIS — I099 Rheumatic heart disease, unspecified: Secondary | ICD-10-CM

## 2019-04-18 DIAGNOSIS — Z7901 Long term (current) use of anticoagulants: Secondary | ICD-10-CM

## 2019-04-18 LAB — POCT INR: INR: 2.5 (ref 2.0–3.0)

## 2019-04-18 NOTE — Telephone Encounter (Signed)
Pt called  you back.

## 2019-04-18 NOTE — Telephone Encounter (Signed)
Left message for patient to return call regarding mychart messages.

## 2019-04-18 NOTE — Patient Instructions (Signed)
Description   Continue taking your same dosage 7.5 mg every day. Recheck INR in 2 weeks.   Call the office with any medication changes or additions [phone].

## 2019-04-19 ENCOUNTER — Other Ambulatory Visit: Payer: Self-pay

## 2019-04-20 ENCOUNTER — Encounter: Payer: Self-pay | Admitting: Thoracic Surgery (Cardiothoracic Vascular Surgery)

## 2019-04-20 ENCOUNTER — Other Ambulatory Visit: Payer: Self-pay | Admitting: *Deleted

## 2019-04-20 ENCOUNTER — Institutional Professional Consult (permissible substitution) (INDEPENDENT_AMBULATORY_CARE_PROVIDER_SITE_OTHER): Payer: BC Managed Care – PPO | Admitting: Thoracic Surgery (Cardiothoracic Vascular Surgery)

## 2019-04-20 VITALS — BP 112/61 | HR 73 | Temp 97.9°F | Resp 18 | Ht 64.0 in | Wt 144.4 lb

## 2019-04-20 DIAGNOSIS — I071 Rheumatic tricuspid insufficiency: Secondary | ICD-10-CM | POA: Diagnosis not present

## 2019-04-20 DIAGNOSIS — I051 Rheumatic mitral insufficiency: Secondary | ICD-10-CM

## 2019-04-20 DIAGNOSIS — I050 Rheumatic mitral stenosis: Secondary | ICD-10-CM

## 2019-04-20 DIAGNOSIS — I099 Rheumatic heart disease, unspecified: Secondary | ICD-10-CM

## 2019-04-20 DIAGNOSIS — I480 Paroxysmal atrial fibrillation: Secondary | ICD-10-CM | POA: Diagnosis not present

## 2019-04-20 NOTE — Patient Instructions (Addendum)
Stop taking Coumadin 7 days prior to surgery (7/22)  Begin taking Lovenox injections 6 days prior to surgery and stop the day prior to surgery  Continue taking all other medications without change through the day before surgery.  Have nothing to eat or drink after midnight the night before surgery.  On the morning of surgery take only Prilosec with a sip of water.

## 2019-04-20 NOTE — Progress Notes (Signed)
[facility]  MULTIDISCIPLINARY HEART VALVE CLINIC  CARDIOTHORACIC SURGERY CONSULTATION REPORT  Referring Provider is Park Liter, MD PCP is Street, Sharon Mt, MD  Chief Complaint  Patient presents with   Mitral Stenosis    new patient consultation, Cath 04/07/2019, TEE 01/06/2019   Mitral Regurgitation    HPI:  Patient is a 63 year old female referred for surgical consultation to discuss treatment options for rheumatic heart disease with combination of severe mitral stenosis, mitral regurgitation, chronic diastolic congestive heart failure, and atrial fibrillation.  Patient states that she became ill at age 27 and has a heart murmur ever since.  She was later diagnosed with rheumatic heart disease.  She has been followed intermittently for many years.  In 2008 she first developed paroxysmal atrial fibrillation.  She was found to have mitral stenosis and mitral regurgitation.  She has been chronically anticoagulated using warfarin ever since.  For the last several years she has been followed by Dr. Agustin Cree.  Echocardiograms have documented the presence of normal left ventricular systolic function with severe mitral stenosis and mitral regurgitation.  Over the past 6 to 12 months the patient has developed decreased exercise tolerance with increasing exertional shortness of breath and fatigue.  Transthoracic echocardiogram performed September 14, 2018 revealed normal left ventricular systolic function with severe rheumatic mitral valve disease including severe mitral stenosis and moderate mitral regurgitation.  There was severe left atrial enlargement and evidence of pulmonary hypertension.  There was mild tricuspid regurgitation.  The patient was referred to Dr. Mina Marble at [facility] to consider balloon mitral valvuloplasty.  Transesophageal echocardiogram was recommended and performed on January 06, 2019.  This confirmed the presence of severe rheumatic  mitral valve disease with severe thickening and calcification of the mitral valve leaflets.  There was severe mitral stenosis and severe mitral regurgitation.  There was moderate tricuspid regurgitation.  Left ventricular systolic function remain normal.  The aortic valve was normal.  The patient's mitral valve was felt not suitable for balloon mitral valvuloplasty.  Referral for possible mitral valve replacement was delayed because of the COVID-19 pandemic.  Eventually the patient underwent left and right heart catheterization on April 07, 2019.  The patient was found to have mild nonobstructive coronary artery disease.  There was mild to moderate pulmonary hypertension and normal left ventricular end-diastolic pressure.  Cardiothoracic surgical consultation was requested.  Patient is widowed and lives alone in [place].  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  Her sister lives nearby and is very supportive, accompanying her for her office consultation visit today.  They both enjoy walking and line dancing.  The patient complains that over the last 6 to 12 months she has developed progressive symptoms of exertional shortness of breath and fatigue.  She gets short of breath quickly if she walks up an incline.  She denies resting shortness of breath, PND, orthopnea, or lower extremity edema.  She has intermittent palpitations and at times can feel her heart beating erratically.  She denies any chest pain or chest tightness either with activity or at rest.  She has had occasional slight dizzy spells without any syncopal episodes.  She has been chronically anticoagulated using warfarin since 2008.  She has not had any significant bleeding complications nor any history of TIA or stroke.  Past Medical History:  Diagnosis Date   Diabetes mellitus without complication ([place])    Hyperlipidemia    Paroxysmal atrial fibrillation ([place]) 01/20/2016   Overview:  Chads score 2 anticoagulated with warfarin    Rheumatic heart disease    Rheumatic mitral regurgitation 01/20/2016   Overview:  Mild to moderate   Severe mitral valve stenosis 01/20/2016   Tricuspid regurgitation     Past Surgical History:  Procedure Laterality Date   NO PAST SURGERIES     RIGHT/LEFT HEART CATH AND CORONARY ANGIOGRAPHY N/A 04/07/2019   Procedure: RIGHT/LEFT HEART CATH AND CORONARY ANGIOGRAPHY;  Surgeon: Leonie Man, MD;  Location: [facility];  Service: Cardiovascular;  Laterality: N/A;   TEE WITHOUT CARDIOVERSION N/A 01/06/2019   Procedure: TRANSESOPHAGEAL ECHOCARDIOGRAM (TEE);  Surgeon: Dorothy Spark, MD;  Location: [facility];  Service: Cardiovascular;  Laterality: N/A;    Family History  Problem Relation Age of Onset   Bladder Cancer Mother    Heart attack Mother    Hypothyroidism Mother    Heart disease Father    Stroke Father    Diabetes Father     Social History   Socioeconomic History   Marital status: Widowed    Spouse name: Not on file   Number of children: Not on file   Years of education: Not on file   Highest education level: Not on file  Occupational History   Not on file  Social Needs   Financial resource strain: Not on file   Food insecurity    Worry: Not on file    Inability: Not on file   Transportation needs    Medical: Not on file    Non-medical: Not on file  Tobacco Use   Smoking status: Never Smoker   Smokeless tobacco: Never Used  Substance and Sexual Activity   Alcohol use: No   Drug use: No   Sexual activity: Not on file  Lifestyle   Physical activity    Days per week: Not on file    Minutes per session: Not on file   Stress: Not on file  Relationships   Social connections    Talks on phone: Not on file    Gets together: Not on file    Attends religious service: Not on file    Active member of club or organization: Not on file    Attends meetings of clubs or organizations: Not on file    Relationship status: Not  on file   Intimate partner violence    Fear of current or ex partner: Not on file    Emotionally abused: Not on file    Physically abused: Not on file    Forced sexual activity: Not on file  Other Topics Concern   Not on file  Social History Narrative   Not on file    Current Outpatient Medications  Medication Sig Dispense Refill   cetirizine (ZYRTEC) 10 MG tablet Take 10 mg by mouth daily.     Chromium (CHROMEMATE PO) Take 600 mg by mouth daily.      clobetasol cream (TEMOVATE) 9.82 % Apply 1 application topically 2 (two) times daily as needed (lichen sclerosus).     digoxin (DIGOX) 0.25 MG tablet Take 1 tablet (250 mcg total) by mouth daily. 90 tablet 1   dronedarone (MULTAQ) 400 MG tablet Take 1 tablet (400 mg total) by mouth 2 (two) times daily. 60 tablet 2   enoxaparin (LOVENOX) 60 MG/0.6ML injection Inject 0.6 mLs (60 mg total) into the skin every 12 (twelve) hours. 4 Syringe 0   JANUVIA 100 MG tablet Take 100 mg by mouth every evening.   [place]  Ketotifen Fumarate (ALLERGY EYE DROPS OP) Place 1 drop into both eyes daily as needed (allergies).     omeprazole (PRILOSEC) 20 MG capsule Take 20 mg by mouth daily as needed (acid reflux).      pravastatin (PRAVACHOL) 40 MG tablet Take 1 tablet (40 mg total) by mouth daily with supper. 90 tablet 0   warfarin (COUMADIN) 5 MG tablet Take As Directed by [facility] (Patient taking differently: Take 7.5 mg by mouth every evening. Take As Directed by [facility] Clinic) 135 tablet 0   No current facility-administered medications for this visit.     Allergies  Allergen Reactions   Penicillins Rash    Did it involve swelling of the face/tongue/throat, SOB, or low BP? No Did it involve sudden or severe rash/hives, skin peeling, or any reaction on the inside of your mouth or nose? No Did you need to seek medical attention at a hospital or doctor's office? No When did it last happen?childhood allergy If all above answers  are NO, may proceed with cephalosporin use.       Review of Systems:   General:  normal appetite, decreased energy, no weight gain, no weight loss, no fever  Cardiac:  no chest pain with exertion, no chest pain at rest, + SOB with exertion, no resting SOB, no PND, no orthopnea, + palpitations, + arrhythmia, + atrial fibrillation, no LE edema, occasional dizzy spells, no syncope  Respiratory:  no shortness of breath, no home oxygen, no productive cough, no dry cough, no bronchitis, no wheezing, no hemoptysis, no asthma, no pain with inspiration or cough, no sleep apnea, no CPAP at night  GI:   no difficulty swallowing, no reflux, no frequent heartburn, no hiatal hernia, no abdominal pain, no constipation, no diarrhea, no hematochezia, no hematemesis, no melena  GU:   no dysuria,  no frequency, no urinary tract infection, no hematuria, no  kidney stones, no kidney disease  Vascular:  no pain suggestive of claudication, no pain in feet, no leg cramps, no varicose veins, no DVT, no non-healing foot ulcer  Neuro:   no stroke, no TIA's, no seizures, no headaches, no temporary blindness one eye,  no slurred speech, no peripheral neuropathy, no chronic pain, no instability of gait, no memory/cognitive dysfunction  Musculoskeletal: no arthritis, no joint swelling, no myalgias, no difficulty walking, normal mobility   Skin:   no rash, no itching, no skin infections, no pressure sores or ulcerations  Psych:   no anxiety, no depression, no nervousness, no unusual recent stress  Eyes:   no blurry vision, no floaters, no recent vision changes, + wears glasses or contacts  ENT:   no hearing loss, no loose or painful teeth, no dentures, last saw dentist January 2020  Hematologic:  no easy bruising, no abnormal bleeding, no clotting disorder, no frequent epistaxis  Endocrine:  + diabetes, does check CBG's at home           Physical Exam:   BP 112/61 (BP Location: Left Arm, Patient Position: Sitting,  Cuff Size: Normal)    Pulse 73    Temp 97.9 F (36.6 C)    Resp 18    Ht 5' 4"  (1.626 m)    Wt 144 lb 6.4 oz (65.5 kg)    SpO2 94% Comment: RA   BMI 24.79 kg/m   General:    well-appearing  HEENT:  Unremarkable   Neck:   no JVD, no bruits, no adenopathy   Chest:   clear to  auscultation, symmetrical breath sounds, no wheezes, no rhonchi   CV:   Irregular rate and rhythm, grade III/VI holosystolic murmur heard best at apex,  no diastolic murmur  Abdomen:  soft, non-tender, no masses   Extremities:  warm, well-perfused, pulses diminished but palpable, no LE edema  Rectal/GU  Deferred  Neuro:   Grossly non-focal and symmetrical throughout  Skin:   Clean and dry, no rashes, no breakdown   Diagnostic Tests:  ---------------------------------------------------------- Transthoracic Echocardiography  Patient:    Stefani, Baik MR #:       [id number] Study Date: 09/14/2018 Gender:     F Age:        51 Height:     162.6 cm Weight:     67.9 kg BSA:        1.76 m^2 Pt. Status: Room:   ATTENDING    Jenne Campus, MD  ORDERING     Jenne Campus, MD  REFERRING    Jenne Campus, MD  SONOGRAPHER  Jimmy Reel, RDCS  PERFORMING   [facility], [place]  cc:  ------------------------------------------------------------------- LV EF: 55% -   60%  ------------------------------------------------------------------- Indications:      Rheumatic AS/MS 396.0.  ------------------------------------------------------------------- History:   PMH:   Atrial fibrillation.  ------------------------------------------------------------------- Study Conclusions  - Left ventricle: The cavity size was normal. There was mild   concentric hypertrophy. Systolic function was normal. The   estimated ejection fraction was in the range of 55% to 60%. Wall   motion was normal; there were no regional wall motion   abnormalities. - Aortic valve: Valve area (VTI): 1.67 cm^2. Valve area (Vmax):    1.75 cm^2. Valve area (Vmean): 1.71 cm^2. - Mitral valve: Calcification. Mobility was restricted. The   findings are consistent with severe stenosis. There was moderate   regurgitation. Valve area by pressure half-time: 1.09 cm^2. Valve   area by continuity equation (using LVOT flow): 0.7 cm^2. - Left atrium: The atrium was severely dilated. - Pulmonary arteries: PA peak pressure: 39 mm Hg (S).  Impressions:  - Normal LVEF.   Severe LAE.   Severe MS (rheumatic?)   Moderate MR   Mild TR.   PAP 39 mmHg.  ------------------------------------------------------------------- Study data:  No prior study was available for comparison.  Study status:  Routine.  Procedure:  The patient reported no pain pre or post test. Transthoracic echocardiography. Image quality was adequate.          Transthoracic echocardiography.  M-mode, complete 2D, spectral Doppler, and color Doppler.  Birthdate: Patient birthdate: [birth year].  Age:  Patient is 63 yr old.  Sex: Gender: female.    BMI: 25.7 kg/m^2.  Blood pressure:     118/62 Patient status:  Outpatient.  Study date:  Study date: 09/14/2018. Study time: 11:10 AM.  Location:  Echo laboratory.  -------------------------------------------------------------------  ------------------------------------------------------------------- Left ventricle:  The cavity size was normal. There was mild concentric hypertrophy. Systolic function was normal. The estimated ejection fraction was in the range of 55% to 60%. Wall motion was normal; there were no regional wall motion abnormalities.  ------------------------------------------------------------------- Aortic valve:   Trileaflet; normal thickness leaflets. Mobility was not restricted.  Doppler:  Transvalvular velocity was within the normal range. There was no stenosis. There was no regurgitation. VTI ratio of LVOT to aortic valve: 0.59. Valve area (VTI): 1.67 cm^2. Indexed valve area (VTI): 0.95  cm^2/m^2. Peak velocity ratio of LVOT to aortic valve: 0.62. Valve area (Vmax): 1.75 cm^2. Indexed valve area (Vmax): 0.99 cm^2/m^2. Mean velocity ratio of  LVOT to aortic valve: 0.6. Valve area (Vmean): 1.71 cm^2. Indexed valve area (Vmean): 0.97 cm^2/m^2.    Mean gradient (S): 9 mm Hg. Peak gradient (S): 17 mm Hg.  ------------------------------------------------------------------- Aorta:  Aortic root: The aortic root was normal in size.  ------------------------------------------------------------------- Mitral valve:   Calcification. Mobility was restricted.  Doppler: The findings are consistent with severe stenosis.   There was moderate regurgitation.    Valve area by pressure half-time: 1.09 cm^2. Indexed valve area by pressure half-time: 0.62 cm^2/m^2. Valve area by continuity equation (using LVOT flow): 0.7 cm^2. Indexed valve area by continuity equation (using LVOT flow): 0.4 cm^2/m^2.    Mean gradient (D): 12 mm Hg. Peak gradient (D): 21 mm Hg.  ------------------------------------------------------------------- Left atrium:  The atrium was severely dilated.  ------------------------------------------------------------------- Right ventricle:  The cavity size was normal. Wall thickness was normal. Systolic function was normal.  ------------------------------------------------------------------- Pulmonic valve:    Structurally normal valve.   Cusp separation was normal.  Doppler:  Transvalvular velocity was within the normal range. There was no evidence for stenosis. There was trivial regurgitation.  ------------------------------------------------------------------- Tricuspid valve:   Structurally normal valve.    Doppler: Transvalvular velocity was within the normal range. There was mild regurgitation.  ------------------------------------------------------------------- Pulmonary artery:   The main pulmonary artery was normal-sized. Systolic pressure was  within the normal range.  ------------------------------------------------------------------- Right atrium:  The atrium was normal in size.  ------------------------------------------------------------------- Pericardium:  There was no pericardial effusion.  ------------------------------------------------------------------- Systemic veins: Inferior vena cava: The vessel was normal in size.  ------------------------------------------------------------------- Measurements   Left ventricle                           Value          Reference  LV ID, ED, PLAX chordal          (H)     54    mm       43 - 52  LV ID, ES, PLAX chordal                  38    mm       23 - 38  LV fx shortening, PLAX chordal           30    %        >=29  LV PW thickness, ED                      13    mm       ----------  IVS/LV PW ratio, ED                      1              <=1.3  Stroke volume, 2D                        77    ml       ----------  Stroke volume/bsa, 2D                    44    ml/m^2   ----------  LV e&', lateral                           3.66  cm/s     ----------  LV E/e&', lateral  62.57          ----------  LV e&', medial                            3.43  cm/s     ----------  LV E/e&', medial                          66.76          ----------  LV e&', average                           3.55  cm/s     ----------  LV E/e&', average                         64.6           ----------    Ventricular septum                       Value          Reference  IVS thickness, ED                        13    mm       ----------    LVOT                                     Value          Reference  LVOT ID, S                               19    mm       ----------  LVOT area                                2.84  cm^2     ----------  LVOT peak velocity, S                    129   cm/s     ----------  LVOT mean velocity, S                    83.6  cm/s     ----------  LVOT VTI,  S                              27.2  cm       ----------  LVOT peak gradient, S                    7     mm Hg    ----------    Aortic valve                             Value          Reference  Aortic valve peak velocity, S            209   cm/s     ----------  Aortic  valve mean velocity, S            139   cm/s     ----------  Aortic valve VTI, S                      46.3  cm       ----------  Aortic mean gradient, S                  9     mm Hg    ----------  Aortic peak gradient, S                  17    mm Hg    ----------  VTI ratio, LVOT/AV                       0.59           ----------  Aortic valve area, VTI                   1.67  cm^2     ----------  Aortic valve area/bsa, VTI               0.95  cm^2/m^2 ----------  Velocity ratio, peak, LVOT/AV            0.62           ----------  Aortic valve area, peak velocity         1.75  cm^2     ----------  Aortic valve area/bsa, peak              0.99  cm^2/m^2 ----------  velocity  Velocity ratio, mean, LVOT/AV            0.6            ----------  Aortic valve area, mean velocity         1.71  cm^2     ----------  Aortic valve area/bsa, mean              0.97  cm^2/m^2 ----------  velocity    Aorta                                    Value          Reference  Aortic root ID, ED                       36    mm       ----------    Left atrium                              Value          Reference  LA ID, A-P, ES                           43    mm       ----------  LA ID/bsa, A-P                   (H)     2.44  cm/m^2   <=2.2  LA volume, S  100   ml       ----------  LA volume/bsa, S                         56.7  ml/m^2   ----------  LA volume, ES, 1-p A4C                   102   ml       ----------  LA volume/bsa, ES, 1-p A4C               57.8  ml/m^2   ----------  LA volume, ES, 1-p A2C                   90.4  ml       ----------  LA volume/bsa, ES, 1-p A2C               51.2  ml/m^2   ----------     Mitral valve                             Value          Reference  Mitral E-wave peak velocity              229   cm/s     ----------  Mitral A-wave peak velocity              196   cm/s     ----------  Mitral mean velocity, D                  159   cm/s     ----------  Mitral deceleration time         (H)     687   ms       150 - 230  Mitral pressure half-time                201   ms       ----------  Mitral mean gradient, D                  12    mm Hg    ----------  Mitral peak gradient, D                  21    mm Hg    ----------  Mitral E/A ratio, peak                   1.2            ----------  Mitral valve area, PHT, DP               1.09  cm^2     ----------  Mitral valve area/bsa, PHT, DP           0.62  cm^2/m^2 ----------  Mitral valve area, LVOT                  0.7   cm^2     ----------  continuity  Mitral valve area/bsa, LVOT              0.4   cm^2/m^2 ----------  continuity  Mitral annulus VTI, D                    110   cm       ----------  Mitral maximal  regurg velocity,          550   cm/s     ----------  PISA  Mitral regurg VTI, PISA                  186   cm       ----------  Mitral ERO, PISA                         0.22  cm^2     ----------  Mitral regurg volume, PISA               41    ml       ----------    Pulmonary arteries                       Value          Reference  PA pressure, S, DP               (H)     39    mm Hg    <=30    Tricuspid valve                          Value          Reference  Tricuspid regurg peak velocity           301   cm/s     ----------  Tricuspid peak RV-RA gradient            36    mm Hg    ----------    Right atrium                             Value          Reference  RA ID, S-I, ES, A4C              (H)     56    mm       34 - 49  RA area, ES, A4C                         14.3  cm^2     8.3 - 19.5  RA volume, ES, A/L                       32.2  ml       ----------  RA volume/bsa, ES, A/L                   18.3  ml/m^2    ----------    Right ventricle                          Value          Reference  TAPSE                                    19.4  mm       ----------  RV pressure, S, DP               (H)     39    mm Hg    <=30  RV s&', lateral, S  12.3  cm/s     ----------  Legend: (L)  and  (H)  mark values outside specified reference range.  ------------------------------------------------------------------- Prepared and Electronically Authenticated by  Jenne Campus, MD 2019-11-20T17:09:51      TRANSESOPHOGEAL ECHO REPORT       Patient Name:   Michael Litter Date of Exam: 01/06/2019 Medical Rec #:  [id number]          Height:       64.0 in Accession #:    [id number]         Weight:       142.0 lb Date of Birth:  18-Jul-1956          BSA:          1.69 m Patient Age:    65 years           BP:           126/82 mmHg Patient Gender: F                  HR:           72 bpm. Exam Location:  Outpatient    Procedure: Transesophageal Echo  Indications:     rheumatic mitral valve disease   History:         Patient has prior history of Echocardiogram examinations, most                  recent 09/14/2018. Mitral Valve Disease, Mitral Stenosis and                  Mild aortic stenosis. Rheumatic heart disease.   Sonographer:     Roseanna Rainbow Referring Phys:  [id number] [place] Diagnosing Phys: Ena Dawley MD     PROCEDURE: Normal Transesophogeal exam. Local oropharyngeal anesthetic was provided with viscous lidocaine. The transesophogeal probe was passed through the esophogus of the patient. Imaged were obtained with the patient in a left lateral decubitus  position. Image quality was excellent. The patient developed no complications during the procedure.  IMPRESSIONS    1. The right ventricle has normal systolc function. Right ventricular systolic pressure is moderately elevated.  2. Left atrial size was severely dilated.  3. Right atrial size  was mildly dilated.  4. Moderately thickened tricuspid valve leaflets.  5. The mitral valve is rheumatic. Severe thickening of the mitral valve leaflet. Severe calcification of the mitral valve leaflet. Mitral valve regurgitation is severe by color flow Doppler. The MR jet is wall-impinging. Critical mitral valve stenosis.  6. Tricuspid valve regurgitation is moderate.  7. The left ventricle has hyperdynamic systolic function, with an ejection fraction of >65%.  SUMMARY   Findings consistent with rheumatic mitral valve disease with severely thickened and calcified leaflets. (Wilkins score 13) Severe mitral stenosis with mean transmitral gradient 16 mmHg. Severe mitral regurgitation with an eccentric jet and reversal of forward flow in the pulmonary veins. Moderate pulmonary hypertension with RVSP 47 mmHg. Severely dilated left atrium.  FINDINGS  Left Ventricle: The left ventricle has hyperdynamic systolic function, with an ejection fraction of >65%. Right Ventricle: The right ventricle has normal systolic function. Right ventricular systolic pressure is moderately elevated. Left Atrium: Left atrial size was severely dilated. Right Atrium: Right atrial size was mildly dilated. Right atrial pressure is estimated at 15 mmHg. Mitral Valve: The mitral valve is rheumatic. Severe thickening of the mitral valve leaflet. Severe calcification of the mitral valve leaflet. Mitral valve regurgitation is severe by  color flow Doppler. The MR jet is wall-impinging. Critical mitral valve  stenosis. Tricuspid Valve: Tricuspid valve regurgitation is moderate by color flow Doppler. The tricuspid valve is moderately thickened. Venous: The inferior vena cava was not well visualized.   LV Wall Scoring:  RIGHT ATRIUM RA Pressure: 15 mmHg  MITRAL VALVE MV Peak grad: 54.8 mmHg MV Mean grad: 32.5 mmHg MV Vmax:      3.70 m/s MV Vmean:     253.0 cm/s MV VTI:       1.11 m MR Peak grad:    105.3 mmHg MR Mean  grad:    54.0 mmHg MR Vmax:         513.00 cm/s MR Vmean:        328.0 cm/s MR PISA:         9.05 cm MR PISA Eff ROA: 66 mm MR PISA Radius:  1.20 cm    Ena Dawley MD Electronically signed by Ena Dawley MD Signature Date/Time: 01/06/2019/1:33:08 PM    RIGHT/LEFT HEART CATH AND CORONARY ANGIOGRAPHY  Conclusion    Hemodynamic findings consistent with mild pulmonary hypertension.  LV end diastolic pressure is low  Mid LAD lesion is 40% stenosed. Otherwise normal coronary arteries   SUMMARY  Mild Pulmonary HTN with normal LVEDP  Moderately reduced Cardiac Output / Index by FICK (Valve disease related)  With exception of mild LAD stenosis after D1, relatively normal coronary Arteries - R dominant   RECOMMENDATIONS  Discharge home after bedrest  Follow-up with Dr. Agustin Cree as scheduled.  Proceed with plans for valve surgery     Glenetta Hew, M.D., M.S. Interventional Cardiologist   Pager # [phone] Phone # [phone] [address] [place], [place] [address]     Recommendations  Antiplatelet/Anticoag No indication for antiplatelet therapy at this time .  Discharge Date In the absence of any other complications or medical issues, we expect the patient to be ready for discharge from a cath perspective on 04/07/2019.  Indications  Severe mitral stenosis by prior echocardiogram [I05.0 (ICD-10-CM)]  Chronic rheumatic heart disease [I09.9 (ICD-10-CM)]  Rheumatic mitral regurgitation [I05.1 (ICD-10-CM)]  Procedural Details  Technical Details PCP: Dr. Christa See Cardiologist: Dr. Arlana Hove is a 64 y.o. female with history of rheumatic heart disease resulting in severe mitral stenosis with regurgitation.  She has now become symptomatic and is therefore starting stages of preoperative evaluation for valve replacement.  She presents here for right and left heart catheterization.  Time Out: Verified  patient identification, verified procedure, site/side was marked, verified correct patient position, special equipment/implants available, medications/allergies/relevent history reviewed, required imaging and test results available. Performed.   Access:  * RIGHT Radial Artery: 6 Fr sheath -- Seldinger technique using Angiocath Kit -- Direct ultrasound guidance used.  Permanent image obtained and placed on chart. --10 mL radial cocktail IA; 3500 Units IV Heparin * Right Brachial/Antecubital Vein: The existing 18-gauge IV was exchanged over a wire for a 5Fr  sheath  Right Heart Catheterization: 5 Fr Gordy Councilman catheter advanced under fluoroscopy with balloon inflated to the RA, RV, then PCWP-PA for hemodynamic measurement.  * Simultaneous FA & PA blood gases checked for SaO2% to calculate FICK CO/CI  * Catheter removed completely out of the body with balloon deflated.  Left Heart Catheterization: 5Fr Catheters advanced or exchanged over a J-wire under direct fluoroscopic guidance into the ascending aorta; TIG 4.0 catheter advanced first.  * Left & Right Coronary Artery Cineangiography: TIG 4.0 Catheter  *  LV Hemodynamics (LV Gram): Angled Pigtail Catheter  Upon completion of LV Hemodynamcs & Aortic Vavle pull-back gradient measurement, the catheter was removed completely out of the body over a wire, without complication.  Brachial Sheath(s) removed in the Cath Lab with manual pressure for hemostasis.   Radial sheath removed in the Cardiac Catheterization lab with TR Band placed for hemostasis.  TR Band: 0830  Hours; 11 mL air  MEDICATIONS * SQ Lidocaine 28m * Radial Cocktail: 3 mg Verapmil in 10 mL NS * Isovue Contrast: 40 mL * Heparin: 3500 Units  Fluoro time: 5.8 minutes. Dose Area Product: 198264mGycm2. Cumulative Air Kerma: 261 mGy.  Estimated blood loss <50 mL.   During this procedure medications were administered to achieve and maintain moderate conscious sedation while the  patient's heart rate, blood pressure, and oxygen saturation were continuously monitored and I was present face-to-face 100% of this time.  Medications (Filter: Administrations occurring from 04/07/19 0726 to 04/07/19 0836) (important)  Continuous medications are totaled by the amount administered until 04/07/19 0836.  Medication Rate/Dose/Volume Action  Date Time   Heparin (Porcine) in NaCl 1000-0.9 UT/500ML-% SOLN (mL) 500 mL Given 04/07/19 0741   Total dose as of 04/07/19 0836 500 mL Given 0742   1,000 mL        fentaNYL (SUBLIMAZE) injection (mcg) 25 mcg Given 04/07/19 0751   Total dose as of 04/07/19 0836        25 mcg        midazolam (VERSED) injection (mg) 1 mg Given 04/07/19 0751   Total dose as of 04/07/19 0836        1 mg        lidocaine (PF) (XYLOCAINE) 1 % injection (mL) 3 mL Given 04/07/19 0751   Total dose as of 04/07/19 0836        3 mL        Radial Cocktail/Verapamil only (mL) 10 mL Given 04/07/19 0755   Total dose as of 04/07/19 0836        10 mL        heparin injection (Units) 3,500 Units Given 04/07/19 0807   Total dose as of 04/07/19 0836        3,500 Units        iohexol (OMNIPAQUE) 350 MG/ML injection (mL) 40 mL Given 04/07/19 0822   Total dose as of 04/07/19 0836        40 mL        Complications  Complications documented before study signed (04/07/2019 81:58AM)   No complications were associated with this study.  Documented by HLeonie Man MD - 04/07/2019 8:43 AM    Coronary Findings  Diagnostic Dominance: Right Left Main  Vessel was injected. Vessel is normal in caliber. Vessel is angiographically normal.  Left Anterior Descending  Vessel was injected. Vessel is normal in caliber. Vessel is angiographically normal.  Mid LAD lesion 40% stenosed  Mid LAD lesion is 40% stenosed. The lesion is located at the major branch, focal and eccentric.  First Diagonal Branch  Vessel is moderate in size. Moderate-large caliber vessel  First Septal Branch   Vessel is small in size.  Second Diagonal Branch  Vessel is small in size.  Second Septal Branch  Vessel is small in size.  Third Septal Branch  Vessel is small in size.  Left Circumflex  Vessel was injected. Vessel is normal in caliber. Vessel is angiographically normal.  Second Obtuse Marginal Branch  Small to medium  caliber  Right Coronary Artery  Vessel was injected. Vessel is normal in caliber. Vessel is angiographically normal.  Right Ventricular Branch  Vessel is small in size.  Right Posterior Atrioventricular Artery  Vessel is moderate in size.  Intervention  No interventions have been documented. Right Heart  Right Heart Pressures Hemodynamic findings consistent with mild pulmonary hypertension. PA P 52/20 mmHg-mean 32 mmHg.   PCWP 21 mmHg.  V wave 31 mmHg LV EDP is normal. LVP-EDP: 108/0 mmHg - 6 mmHg AoP-MAP: 110/60 mmHg - 80 mmHg Ao sat 97%, PA sat 67%. Cardiac Output-Index Kathlen Brunswick): 4.3-2.41  Right Atrium The right atrial size is normal. Right atrial pressure is normal. RAP mean 71mHg  Right Ventricle The right ventricle is mildly dilated. RVP-EDP: 53/0 mmHg - 3 mmHg  Wall Motion  Resting    No left ventriculogram performed.        Left Heart  Left Ventricle LV end diastolic pressure is low.  Aortic Valve There is no aortic valve stenosis. No calcification found in the aortic valve.  Coronary Diagrams  Diagnostic Dominance: Right  Intervention  Implants   No implant documentation for this case.  Syngo Images  Show images for CARDIAC CATHETERIZATION  Images on Long Term Storage  Show images for MCarleena, Miresto Procedure Log  Procedure Log    Hemo Data   Most Recent Value  Fick Cardiac Output 4.13 L/min  Fick Cardiac Output Index 2.41 (L/min)/BSA  RA A Wave -99 mmHg  RA V Wave 5 mmHg  RA Mean 3 mmHg  RV Systolic Pressure 53 mmHg  RV Diastolic Pressure -3 mmHg  RV EDP 3 mmHg  PA Systolic Pressure 54 mmHg  PA Diastolic  Pressure 18 mmHg  PA Mean 33 mmHg  PW A Wave -99 mmHg  PW V Wave 33 mmHg  PW Mean 21 mmHg  AO Systolic Pressure 98 mmHg  AO Diastolic Pressure 58 mmHg  AO Mean 74 mmHg  LV Systolic Pressure 1496mmHg  LV Diastolic Pressure 0 mmHg  LV EDP 6 mmHg  AOp Systolic Pressure 1759mmHg  AOp Diastolic Pressure 60 mmHg  AOp Mean Pressure 80 mmHg  LVp Systolic Pressure 1163mmHg  LVp Diastolic Pressure 0 mmHg  LVp EDP Pressure 5 mmHg  QP/QS 1  TPVR Index 13.24 HRUI  TSVR Index 31.44 HRUI  PVR SVR Ratio 0.15  TPVR/TSVR Ratio 0.42        Impression:  Patient has longstanding rheumatic heart disease with severe mitral stenosis and severe mitral regurgitation.  She also has longstanding history of paroxysmal atrial fibrillation which more recently has remained persistent.  She describes progressive symptoms of exertional shortness of breath and fatigue consistent with chronic diastolic congestive heart failure, New York Heart Association functional class IIb.  I have personally reviewed the patient's most recent transthoracic and transesophageal echocardiograms as well as her diagnostic cardiac catheterization.  Echocardiograms demonstrate classical rheumatic mitral valve disease with severe thickening and calcification of both the anterior and posterior leaflets.  There is severe thickening, effusion, and foreshortening of the subvalvular apparatus.  There is severe mitral stenosis and at least moderate if not severe mitral regurgitation.  There is severe left atrial enlargement.  Left ventricular size and function is normal.  The aortic valve appears normal.  There is mild to moderate pulmonary hypertension.  There is mild right ventricular chamber enlargement with moderate tricuspid regurgitation.  Functional anatomy of the tricuspid valve is not well elucidated and could  be either type I or type IIIa dysfunction.  I agree the patient would best be treated with mitral valve replacement.  She may  need concomitant tricuspid valve repair.  She might benefit from concomitant Maze procedure.  She appears to be reasonably good candidate for minimally invasive approach for surgery.     Plan:  The patient and her sister were counseled at length regarding the indications, risks and potential benefits of mitral valve replacement.  The rationale for elective surgery has been explained, including a comparison between surgery and continued medical therapy with close follow-up.   The possibility of need for concomitant tricuspid valve repair was discussed.  The relative risks and benefits of performing a maze procedure at the time of her surgery was discussed at length, including the expected likelihood of long term freedom from recurrent symptomatic atrial fibrillation and/or atrial flutter.  Alternative surgical approaches have been discussed including a comparison between conventional sternotomy and minimally-invasive techniques.  The relative risks and benefits of each have been reviewed as they pertain to the patient's specific circumstances, and expectations for the patient's postoperative convalescence has been discussed.  Expectations for the patient's postoperative convalescence have been discussed.   Finally, we discussed the possibility of replacing the mitral valve using a mechanical prosthesis with the attendant need for long-term anticoagulation versus the alternative of replacing it using a bioprosthetic tissue valve with its potential for late structural valve deterioration and failure, depending upon the patient's longevity.  The patient specifically requests that if the mitral valve must be replaced that it be done using a mechanical valve.   The patient desires to proceed with surgery in the near future.   We also discussed matters within the context of the ongoing COVID-19 pandemic including current hospital restrictions for visitors.  We tentatively plan to proceed with surgery on May 24, 2019.  The patient will return to our office for follow-up and routine preoperative testing on May 22, 2019.  Prior to that she will undergo CT angiography to evaluate the feasibility of peripheral cannulation for surgery.  The patient has been instructed to stop taking warfarin 7 days prior to surgery.  She will begin Lovenox injections the following day for bridging.  All questions answered.    I spent in excess of 90 minutes during the conduct of this office consultation and >50% of this time involved direct face-to-face encounter with the patient for counseling and/or coordination of their care.     Valentina Gu. Roxy Manns, MD 04/20/2019 4:33 PM

## 2019-04-21 ENCOUNTER — Telehealth: Payer: Self-pay | Admitting: Cardiology

## 2019-04-21 NOTE — Telephone Encounter (Signed)
Just FYI, Dr Ricard Dillon scheduled her surgery for 07/29

## 2019-04-24 ENCOUNTER — Encounter: Payer: Self-pay | Admitting: *Deleted

## 2019-04-24 ENCOUNTER — Other Ambulatory Visit: Payer: Self-pay | Admitting: *Deleted

## 2019-04-24 DIAGNOSIS — I480 Paroxysmal atrial fibrillation: Secondary | ICD-10-CM

## 2019-04-24 DIAGNOSIS — I051 Rheumatic mitral insufficiency: Secondary | ICD-10-CM

## 2019-04-24 DIAGNOSIS — I071 Rheumatic tricuspid insufficiency: Secondary | ICD-10-CM

## 2019-04-24 DIAGNOSIS — I050 Rheumatic mitral stenosis: Secondary | ICD-10-CM

## 2019-04-25 ENCOUNTER — Telehealth: Payer: Self-pay

## 2019-04-25 ENCOUNTER — Ambulatory Visit (INDEPENDENT_AMBULATORY_CARE_PROVIDER_SITE_OTHER): Payer: BC Managed Care – PPO | Admitting: Cardiology

## 2019-04-25 ENCOUNTER — Encounter: Payer: Self-pay | Admitting: Cardiology

## 2019-04-25 ENCOUNTER — Other Ambulatory Visit: Payer: Self-pay

## 2019-04-25 VITALS — BP 128/70 | HR 74 | Ht 64.0 in | Wt 145.4 lb

## 2019-04-25 DIAGNOSIS — Z7901 Long term (current) use of anticoagulants: Secondary | ICD-10-CM

## 2019-04-25 DIAGNOSIS — I051 Rheumatic mitral insufficiency: Secondary | ICD-10-CM

## 2019-04-25 DIAGNOSIS — I480 Paroxysmal atrial fibrillation: Secondary | ICD-10-CM | POA: Diagnosis not present

## 2019-04-25 DIAGNOSIS — I071 Rheumatic tricuspid insufficiency: Secondary | ICD-10-CM

## 2019-04-25 DIAGNOSIS — I050 Rheumatic mitral stenosis: Secondary | ICD-10-CM | POA: Diagnosis not present

## 2019-04-25 NOTE — Progress Notes (Signed)
Cardiology Office Note:    Date:  04/25/2019   ID:  Melody Bakerlaine S Poyer, DOB 10/26/1956, MRN [id number]  PCP:  Street, [name], MD  Cardiologist:  Gypsy Balsamobert Christion Leonhard, MD    Referring MD: Street, [name], *   Chief Complaint  Patient presents with  . Follow up from Cath  I am doing well  History of Present Illness:    Melody Torres is a 63 y.o. female with rheumatic mitral stenosis mitral regurgitation.  She is being evaluated for potential open heart surgery to replace her mitral valve.  She is seen Dr. Barry Dieneswens, cardiac catheterization has been done.  She is ready to proceed with surgery which is scheduled already for 29 July.  We talked a lot about the surgery.  She got a lot of very good questions all were answered.  He is very knowledgeable about it she asked me about the need to fix her tricuspid valve, she asked me about maze procedures.  All this issue were discussed with her.  She is anxious but at the same time excited to proceed with the surgery.  I will bring her to the office week before surgery to recheck her INR so we know how soon we need to stop her Coumadin and bridge her with Lovenox.  Described to have some shortness of breath which appears to be worse than before.  Denies to have some palpitations sometimes but overall seems to be doing well hemodynamically compensated.  Past Medical History:  Diagnosis Date  . Diabetes mellitus without complication (HCC)   . Hyperlipidemia   . Paroxysmal atrial fibrillation (HCC) 01/20/2016   Overview:  Chads score 2 anticoagulated with warfarin  . Rheumatic heart disease   . Rheumatic mitral regurgitation 01/20/2016   Overview:  Mild to moderate  . Severe mitral valve stenosis 01/20/2016  . Tricuspid regurgitation     Past Surgical History:  Procedure Laterality Date  . NO PAST SURGERIES    . RIGHT/LEFT HEART CATH AND CORONARY ANGIOGRAPHY N/A 04/07/2019   Procedure: RIGHT/LEFT HEART CATH AND CORONARY ANGIOGRAPHY;   Surgeon: Marykay LexHarding, David W, MD;  [facility];  Service: Cardiovascular;  Laterality: N/A;  . TEE WITHOUT CARDIOVERSION N/A 01/06/2019   Procedure: TRANSESOPHAGEAL ECHOCARDIOGRAM (TEE);  Surgeon: Lars MassonNelson, Katarina H, MD;  [facility];  Service: Cardiovascular;  Laterality: N/A;    Current Medications: Current Meds  Medication Sig  . cetirizine (ZYRTEC) 10 MG tablet Take 10 mg by mouth daily.  . Chromium (CHROMEMATE PO) Take 600 mg by mouth daily.   . clobetasol cream (TEMOVATE) 0.05 % Apply 1 application topically 2 (two) times daily as needed (lichen sclerosus).  Marland Kitchen. digoxin (DIGOX) 0.25 MG tablet Take 1 tablet (250 mcg total) by mouth daily.  Marland Kitchen. dronedarone (MULTAQ) 400 MG tablet Take 1 tablet (400 mg total) by mouth 2 (two) times daily.  Marland Kitchen. enoxaparin (LOVENOX) 60 MG/0.6ML injection Inject 0.6 mLs (60 mg total) into the skin every 12 (twelve) hours.  Marland Kitchen. JANUVIA 100 MG tablet Take 100 mg by mouth every evening.   Marland Kitchen. Ketotifen Fumarate (ALLERGY EYE DROPS OP) Place 1 drop into both eyes daily as needed (allergies).  Marland Kitchen. omeprazole (PRILOSEC) 20 MG capsule Take 20 mg by mouth daily as needed (acid reflux).   . pravastatin (PRAVACHOL) 40 MG tablet Take 1 tablet (40 mg total) by mouth daily with supper.  . warfarin (COUMADIN) 5 MG tablet Take As Directed by [facility] (Patient taking differently: Take 7.5 mg  by mouth every evening. Take As Directed by Coumadin Clinic)     Allergies:   Penicillins   Social History   Socioeconomic History  . Marital status: Widowed    Spouse name: Not on file  . Number of children: Not on file  . Years of education: Not on file  . Highest education level: Not on file  Occupational History  . Not on file  Social Needs  . Financial resource strain: Not on file  . Food insecurity    Worry: Not on file    Inability: Not on file  . Transportation needs    Medical: Not on file    Non-medical: Not on file  Tobacco Use  . Smoking  status: Never Smoker  . Smokeless tobacco: Never Used  Substance and Sexual Activity  . Alcohol use: No  . Drug use: No  . Sexual activity: Not on file  Lifestyle  . Physical activity    Days per week: Not on file    Minutes per session: Not on file  . Stress: Not on file  Relationships  . Social Herbalist on phone: Not on file    Gets together: Not on file    Attends religious service: Not on file    Active member of club or organization: Not on file    Attends meetings of clubs or organizations: Not on file    Relationship status: Not on file  Other Topics Concern  . Not on file  Social History Narrative  . Not on file     Family History: The patient's family history includes Bladder Cancer in her mother; Diabetes in her father; Heart attack in her mother; Heart disease in her father; Hypothyroidism in her mother; Stroke in her father. ROS:   Please see the history of present illness.    All 14 point review of systems negative except as described per history of present illness  EKGs/Labs/Other Studies Reviewed:      Recent Labs: 03/29/2019: BUN 17; Creatinine, Ser 0.95; Platelets 193 04/07/2019: Hemoglobin 12.9; Hemoglobin 12.9; Potassium 4.2; Potassium 4.2; Sodium 143; Sodium 143  Recent Lipid Panel    Component Value Date/Time   CHOL 140 04/29/2017 1656   TRIG 232 (H) 04/29/2017 1656   HDL 37 (L) 04/29/2017 1656   CHOLHDL 3.8 04/29/2017 1656   LDLCALC 57 04/29/2017 1656    Physical Exam:    VS:  BP 128/70   Pulse 74   Ht 5\' 4"  (1.626 m)   Wt 145 lb 6.4 oz (66 kg)   SpO2 98%   BMI 24.96 kg/m     Wt Readings from Last 3 Encounters:  04/25/19 145 lb 6.4 oz (66 kg)  04/20/19 144 lb 6.4 oz (65.5 kg)  03/29/19 145 lb (65.8 kg)     GEN:  Well nourished, well developed in no acute distress HEENT: Normal NECK: No JVD; No carotid bruits LYMPHATICS: No lymphadenopathy CARDIAC: RRR, holosystolic murmur grade 2/6 best heard at apex,, no rubs, no  gallops RESPIRATORY:  Clear to auscultation without rales, wheezing or rhonchi  ABDOMEN: Soft, non-tender, non-distended MUSCULOSKELETAL:  No edema; No deformity  SKIN: Warm and dry LOWER EXTREMITIES: no swelling NEUROLOGIC:  Alert and oriented x 3 PSYCHIATRIC:  Normal affect   ASSESSMENT:    1. Anticoagulation adequate with anticoagulant therapy   2. Severe mitral valve stenosis   3. Rheumatic mitral regurgitation   4. Paroxysmal atrial fibrillation (HCC)   5. Tricuspid valve insufficiency,  unspecified etiology   6. Long term (current) use of anticoagulants [Z79.01]    PLAN:    In order of problems listed above:  1. Severe mitral valve stenosis getting ready for surgery.  I told her she is in excellent hands and I wished her well.  I see her after surgery. 2. Rheumatic mitral regurgitation again surgery is being scheduled. 3. Paroxysmal atrial fibrillation anticoagulated will have Maze procedure. 4. Tricuspid valve insufficiency she may require it tricuspid valve ring.  I explained to her daughter her picture have this done.  Overall I think she would do fine I wish her luck and see her after surgery   Medication Adjustments/Labs and Tests Ordered: Current medicines are reviewed at length with the patient today.  Concerns regarding medicines are outlined above.  Orders Placed This Encounter  Procedures  . Protime-INR   Medication changes: No orders of the defined types were placed in this encounter.   Signed, Georgeanna Leaobert J. Jacquelynne Guedes, MD, [facility] 04/25/2019 5:19 PM    [facility]

## 2019-04-25 NOTE — Patient Instructions (Signed)
Medication Instructions:  Your physician recommends that you continue on your current medications as directed. Please refer to the Current Medication list given to you today.  If you need a refill on your cardiac medications before your next appointment, please call your pharmacy.   Lab work: Your physician recommends that you return for lab work on July 22nd: inr   If you have labs (blood work) drawn today and your tests are completely normal, you will receive your results only by: Marland Kitchen MyChart Message (if you have MyChart) OR . A paper copy in the mail If you have any lab test that is abnormal or we need to change your treatment, we will call you to review the results.  Testing/Procedures: None.   Follow-Up:   Will follow up after surgery.

## 2019-04-25 NOTE — Telephone Encounter (Signed)
lmom for prescreen  

## 2019-04-26 NOTE — Telephone Encounter (Signed)

## 2019-05-01 ENCOUNTER — Other Ambulatory Visit: Payer: Self-pay | Admitting: Cardiology

## 2019-05-02 ENCOUNTER — Other Ambulatory Visit: Payer: Self-pay

## 2019-05-02 ENCOUNTER — Ambulatory Visit (INDEPENDENT_AMBULATORY_CARE_PROVIDER_SITE_OTHER): Payer: BC Managed Care – PPO | Admitting: *Deleted

## 2019-05-02 DIAGNOSIS — I099 Rheumatic heart disease, unspecified: Secondary | ICD-10-CM | POA: Diagnosis not present

## 2019-05-02 DIAGNOSIS — Z7901 Long term (current) use of anticoagulants: Secondary | ICD-10-CM

## 2019-05-02 DIAGNOSIS — I480 Paroxysmal atrial fibrillation: Secondary | ICD-10-CM

## 2019-05-02 DIAGNOSIS — Z5181 Encounter for therapeutic drug level monitoring: Secondary | ICD-10-CM

## 2019-05-02 LAB — POCT INR: INR: 3.2 — AB (ref 2.0–3.0)

## 2019-05-02 MED ORDER — ENOXAPARIN SODIUM 100 MG/ML ~~LOC~~ SOLN: 100.0000 mg | SUBCUTANEOUS | 0 refills | Status: DC

## 2019-05-02 NOTE — Patient Instructions (Addendum)
  Description   Skip today's dose, then Continue taking your same dosage 7.5 mg every day. Take your last Coumadin dose on 05/16/19, see patient instruction for further instructions. Recheck INR in 4 weeks.   Call the office with any medication changes or additions [phone].     05/16/19- Last dose of Coumadin.  05/17/19-  No Coumadin or Lovenox.  05/18/19-  Inject Lovenox 100 mg in the fatty abdominal tissue at least 2 inches from the belly button once a day 24 hours apart at  8pm rotate sites. No Coumadin.  05/19/19-  Inject Lovenox in the fatty tissue every  24 hours at  8pm. No Coumadin.  05/20/19- Inject Lovenox in the fatty tissue every 24 hours at  8pm. No Coumadin  7/26//20-  Inject Lovenox in the fatty tissue every 24 hours at 8pm. No Coumadin.  05/22/19- Inject Lovenox in the fatty tissue every  24 hours at  8pm. No Coumadin.  05/23/19- No Coumadin or Lovenox   05/24/19- Procedure Day - No Lovenox or Coumadin. Call us at discharge to schedule follow INR couamdin appointment. # [phone].

## 2019-05-05 ENCOUNTER — Ambulatory Visit
Admission: RE | Admit: 2019-05-05 | Discharge: 2019-05-05 | Disposition: A | Discharge status: Home / Self Care | Payer: BC Managed Care – PPO | Source: Ambulatory Visit | Attending: Thoracic Surgery (Cardiothoracic Vascular Surgery) | Admitting: Thoracic Surgery (Cardiothoracic Vascular Surgery)

## 2019-05-05 DIAGNOSIS — I2510 Atherosclerotic heart disease of native coronary artery without angina pectoris: Secondary | ICD-10-CM | POA: Diagnosis not present

## 2019-05-05 DIAGNOSIS — I700 Atherosclerosis of aorta: Secondary | ICD-10-CM | POA: Diagnosis not present

## 2019-05-05 DIAGNOSIS — I050 Rheumatic mitral stenosis: Secondary | ICD-10-CM

## 2019-05-05 MED ORDER — IOPAMIDOL (ISOVUE-370) INJECTION 76%
75.0000 mL | Freq: Once | INTRAVENOUS | Status: AC | PRN
  Administered 2019-05-05: 75 mL via INTRAVENOUS

## 2019-05-19 NOTE — Progress Notes (Signed)
[facility] [place], [place] - [address] [place] [place] [address] Phone: [phone] Fax: [phone]  [facility] [address] - [place], [place] - [address] [place] [address] Phone: [phone] Fax: [phone]      Your procedure is scheduled on July 29th.  Report [facility] Main Entrance "A" at 5:30 A.M., and check in at the Admitting office.  Call this number if you have problems the morning of surgery:  [phone]  Call [phone] if you have any questions prior to your surgery date Monday-Friday 8am-4pm    Remember:  Do not eat or drink after midnight the night before your surgery    Take these medicines the morning of surgery with A SIP OF WATER   Digoxin  Omeprazole (Prilosec)  Follow your surgeon's instructions on when to stop Lovenox and Coumadin.  If no instructions were given by your surgeon then you will need to call the office to get those instructions.     7 days prior to surgery STOP taking any Aspirin (unless otherwise instructed by your surgeon), Aleve, Naproxen, Ibuprofen, Motrin, Advil, Goody's, BC's, all herbal medications, fish oil, and all vitamins.   WHAT DO I DO ABOUT MY DIABETES MEDICATION?   Marland Kitchen. Do not take oral diabetes medicines (pills) the morning of surgery. - Januvia    How to Manage Your Diabetes Before and After Surgery  Why is it important to control my blood sugar before and after surgery? . Improving blood sugar levels before and after surgery helps healing and can limit problems. . A way of improving blood sugar control is eating a healthy diet by: o  Eating less sugar and carbohydrates o  Increasing activity/exercise o  Talking with your doctor about reaching your blood sugar goals . High blood sugars (greater than 180 mg/dL) can raise your risk of infections and slow your recovery, so you will need to focus on controlling your diabetes during the weeks before surgery. . Make sure that  the doctor who takes care of your diabetes knows about your planned surgery including the date and location.  How do I manage my blood sugar before surgery? . Check your blood sugar at least 4 times a day, starting 2 days before surgery, to make sure that the level is not too high or low. o Check your blood sugar the morning of your surgery when you wake up and every 2 hours until you get to the Short Stay unit. . If your blood sugar is less than 70 mg/dL, you will need to treat for low blood sugar: o Do not take insulin. o Treat a low blood sugar (less than 70 mg/dL) with  cup of clear juice (cranberry or apple), 4 glucose tablets, OR glucose gel. o Recheck blood sugar in 15 minutes after treatment (to make sure it is greater than 70 mg/dL). If your blood sugar is not greater than 70 mg/dL on recheck, call [phone] for further instructions. . Report your blood sugar to the short stay nurse when you get to Short Stay.  . If you are admitted to the hospital after surgery: o Your blood sugar will be checked by the staff and you will probably be given insulin after surgery (instead of oral diabetes medicines) to make sure you have good blood sugar levels. o The goal for blood sugar control after surgery is 80-180 mg/dL.    The Morning of Surgery  Do not  wear jewelry, make-up or nail polish.  Do not wear lotions, powders, or perfumes, or deodorant  Do not shave 48 hours prior to surgery.    Do not bring valuables to the hospital.  [facility] is not responsible for any belongings or valuables.  If you are a smoker, DO NOT Smoke 24 hours prior to surgery IF you wear a CPAP at night please bring your mask, tubing, and machine the morning of surgery   Remember that you must have someone to transport you home after your surgery, and remain with you for 24 hours if you are discharged the same day.   Contacts, glasses, hearing aids, dentures or bridgework may not be worn into surgery.     Leave your suitcase in the car.  After surgery it may be brought to your room.  For patients admitted to the hospital, discharge time will be determined by your treatment team.  Patients discharged the day of surgery will not be allowed to drive home.    Special instructions:   [facility]- Preparing For Surgery  Before surgery, you can play an important role. Because skin is not sterile, your skin needs to be as free of germs as possible. You can reduce the number of germs on your skin by washing with CHG (chlorahexidine gluconate) Soap before surgery.  CHG is an antiseptic cleaner which kills germs and bonds with the skin to continue killing germs even after washing.    Oral Hygiene is also important to reduce your risk of infection.  Remember - BRUSH YOUR TEETH THE MORNING OF SURGERY WITH YOUR REGULAR TOOTHPASTE  Please do not use if you have an allergy to CHG or antibacterial soaps. If your skin becomes reddened/irritated stop using the CHG.  Do not shave (including legs and underarms) for at least 48 hours prior to first CHG shower. It is OK to shave your face.  Please follow these instructions carefully.   1. Shower the NIGHT BEFORE SURGERY and the MORNING OF SURGERY with CHG Soap.   2. If you chose to wash your hair, wash your hair first as usual with your normal shampoo.  3. After you shampoo, rinse your hair and body thoroughly to remove the shampoo.  4. Use CHG as you would any other liquid soap. You can apply CHG directly to the skin and wash gently with a scrungie or a clean washcloth.   5. Apply the CHG Soap to your body ONLY FROM THE NECK DOWN.  Do not use on open wounds or open sores. Avoid contact with your eyes, ears, mouth and genitals (private parts). Wash Face and genitals (private parts)  with your normal soap.   6. Wash thoroughly, paying special attention to the area where your surgery will be performed.  7. Thoroughly rinse your body with warm water from  the neck down.  8. DO NOT shower/wash with your normal soap after using and rinsing off the CHG Soap.  9. Pat yourself dry with a CLEAN TOWEL.  10. Wear CLEAN PAJAMAS to bed the night before surgery, wear comfortable clothes the morning of surgery  11. Place CLEAN SHEETS on your bed the night of your first shower and DO NOT SLEEP WITH PETS.   Day of Surgery:  Do not apply any deodorants/lotions. Please shower the morning of surgery with the CHG soap  Please wear clean clothes to the hospital/surgery center.   Remember to brush your teeth WITH YOUR REGULAR TOOTHPASTE.   Please read over  the following fact sheets that you were given.

## 2019-05-22 ENCOUNTER — Other Ambulatory Visit (HOSPITAL_COMMUNITY)
Admission: RE | Admit: 2019-05-22 | Discharge: 2019-05-22 | Disposition: A | Discharge status: Home / Self Care | Payer: BC Managed Care – PPO | Source: Ambulatory Visit | Attending: Thoracic Surgery (Cardiothoracic Vascular Surgery) | Admitting: Thoracic Surgery (Cardiothoracic Vascular Surgery)

## 2019-05-22 ENCOUNTER — Ambulatory Visit (HOSPITAL_COMMUNITY)
Admission: RE | Admit: 2019-05-22 | Discharge: 2019-05-22 | Disposition: A | Discharge status: Home / Self Care | Payer: BC Managed Care – PPO | Source: Ambulatory Visit | Attending: Thoracic Surgery (Cardiothoracic Vascular Surgery) | Admitting: Thoracic Surgery (Cardiothoracic Vascular Surgery)

## 2019-05-22 ENCOUNTER — Ambulatory Visit (INDEPENDENT_AMBULATORY_CARE_PROVIDER_SITE_OTHER): Payer: BC Managed Care – PPO | Admitting: Thoracic Surgery (Cardiothoracic Vascular Surgery)

## 2019-05-22 ENCOUNTER — Encounter (HOSPITAL_COMMUNITY): Payer: Self-pay

## 2019-05-22 ENCOUNTER — Encounter (HOSPITAL_COMMUNITY)
Admission: RE | Admit: 2019-05-22 | Discharge: 2019-05-22 | Disposition: A | Discharge status: Home / Self Care | Payer: BC Managed Care – PPO | Source: Ambulatory Visit | Attending: Thoracic Surgery (Cardiothoracic Vascular Surgery) | Admitting: Thoracic Surgery (Cardiothoracic Vascular Surgery)

## 2019-05-22 ENCOUNTER — Other Ambulatory Visit: Payer: Self-pay

## 2019-05-22 VITALS — BP 110/74 | HR 82 | Temp 97.8°F | Resp 20 | Ht 64.0 in | Wt 140.0 lb

## 2019-05-22 DIAGNOSIS — I051 Rheumatic mitral insufficiency: Secondary | ICD-10-CM

## 2019-05-22 DIAGNOSIS — I5032 Chronic diastolic (congestive) heart failure: Secondary | ICD-10-CM | POA: Diagnosis not present

## 2019-05-22 DIAGNOSIS — I050 Rheumatic mitral stenosis: Secondary | ICD-10-CM | POA: Insufficient documentation

## 2019-05-22 DIAGNOSIS — I4891 Unspecified atrial fibrillation: Secondary | ICD-10-CM | POA: Diagnosis not present

## 2019-05-22 DIAGNOSIS — I480 Paroxysmal atrial fibrillation: Secondary | ICD-10-CM | POA: Diagnosis not present

## 2019-05-22 DIAGNOSIS — R918 Other nonspecific abnormal finding of lung field: Secondary | ICD-10-CM | POA: Diagnosis not present

## 2019-05-22 DIAGNOSIS — I071 Rheumatic tricuspid insufficiency: Secondary | ICD-10-CM

## 2019-05-22 DIAGNOSIS — Z1159 Encounter for screening for other viral diseases: Secondary | ICD-10-CM | POA: Diagnosis not present

## 2019-05-22 DIAGNOSIS — Z01818 Encounter for other preprocedural examination: Secondary | ICD-10-CM | POA: Insufficient documentation

## 2019-05-22 DIAGNOSIS — E119 Type 2 diabetes mellitus without complications: Secondary | ICD-10-CM | POA: Diagnosis not present

## 2019-05-22 DIAGNOSIS — E785 Hyperlipidemia, unspecified: Secondary | ICD-10-CM | POA: Diagnosis not present

## 2019-05-22 HISTORY — DX: Gastro-esophageal reflux disease without esophagitis: K21.9

## 2019-05-22 LAB — BLOOD GAS, ARTERIAL
Acid-Base Excess: 2.6 mmol/L — ABNORMAL HIGH (ref 0.0–2.0)
Bicarbonate: 26.4 mmol/L (ref 20.0–28.0)
Drawn by: 470591
FIO2: 21
O2 Saturation: 99.1 %
Patient temperature: 98.6
pCO2 arterial: 39.3 mmHg (ref 32.0–48.0)
pH, Arterial: 7.442 (ref 7.350–7.450)
pO2, Arterial: 142 mmHg — ABNORMAL HIGH (ref 83.0–108.0)

## 2019-05-22 LAB — CBC
HCT: 43.3 % (ref 36.0–46.0)
Hemoglobin: 14.3 g/dL (ref 12.0–15.0)
MCH: 29.5 pg (ref 26.0–34.0)
MCHC: 33 g/dL (ref 30.0–36.0)
MCV: 89.5 fL (ref 80.0–100.0)
Platelets: 171 10*3/uL (ref 150–400)
RBC: 4.84 MIL/uL (ref 3.87–5.11)
RDW: 13.2 % (ref 11.5–15.5)
WBC: 8.7 10*3/uL (ref 4.0–10.5)
nRBC: 0 % (ref 0.0–0.2)

## 2019-05-22 LAB — ABO/RH: ABO/RH(D): A POS

## 2019-05-22 LAB — URINALYSIS, ROUTINE W REFLEX MICROSCOPIC
Bilirubin Urine: NEGATIVE
Glucose, UA: 50 mg/dL — AB
Hgb urine dipstick: NEGATIVE
Ketones, ur: NEGATIVE mg/dL
Leukocytes,Ua: NEGATIVE
Nitrite: NEGATIVE
Protein, ur: NEGATIVE mg/dL
Specific Gravity, Urine: 1.018 (ref 1.005–1.030)
pH: 6 (ref 5.0–8.0)

## 2019-05-22 LAB — PROTIME-INR
INR: 1.2 (ref 0.8–1.2)
Prothrombin Time: 14.9 seconds (ref 11.4–15.2)

## 2019-05-22 LAB — TYPE AND SCREEN
ABO/RH(D): A POS
Antibody Screen: NEGATIVE

## 2019-05-22 LAB — APTT: aPTT: 33 seconds (ref 24–36)

## 2019-05-22 LAB — SURGICAL PCR SCREEN
MRSA, PCR: NEGATIVE
Staphylococcus aureus: NEGATIVE

## 2019-05-22 LAB — GLUCOSE, CAPILLARY: Glucose-Capillary: 138 mg/dL — ABNORMAL HIGH (ref 70–99)

## 2019-05-22 LAB — HEMOGLOBIN A1C
Hgb A1c MFr Bld: 6.5 % — ABNORMAL HIGH (ref 4.8–5.6)
Mean Plasma Glucose: 139.85 mg/dL

## 2019-05-22 NOTE — Patient Instructions (Signed)
Do not take Coumadin (warfarin)  Stop taking Lovenox after you take your shot tonight  Continue taking all other medications without change through the day before surgery.  Have nothing to eat or drink after midnight the night before surgery.  Have nothing to eat or drink after midnight the night before surgery.  On the morning of surgery take only Prilosec with a sip of water.

## 2019-05-22 NOTE — Progress Notes (Signed)
[address]       [place] [id number]             [phone]     CARDIOTHORACIC SURGERY OFFICE NOTE  Referring Provider is Georgeanna LeaKrasowski, Robert J, MD  PCP is Street, [name], MD   HPI:  Patient is a 63 year old female with history of rheumatic heart disease including severe mitral stenosis, mitral regurgitation, chronic diastolic congestive heart failure and persistent atrial fibrillation who returns to the office today with tentative plans to proceed with minimally invasive mitral valve replacement, possible tricuspid valve repair, and Maze procedure later this week.  She was originally seen in consultation on April 20, 2019.  She underwent CT angiography which reveals no contraindication to peripheral cannulation for surgery.  She reports no new problems or complaints over the past few weeks.  She has been practicing social distancing, wearing a mask whenever in public, and she has not been exposed to any persons with known or suspected COVID-19 infection.  She describes stable symptoms of exertional shortness of breath.   Current Outpatient Medications  Medication Sig Dispense Refill  . Chromium (CHROMEMATE PO) Take 600 mg by mouth daily.     . clobetasol cream (TEMOVATE) 0.05 % Apply 1 application topically 2 (two) times daily as needed (for lichen sclerosus).     Marland Kitchen. digoxin (DIGOX) 0.25 MG tablet Take 1 tablet (250 mcg total) by mouth daily. 90 tablet 1  . enoxaparin (LOVENOX) 100 MG/ML injection Inject 1 mL (100 mg total) into the skin daily. 10 mL 0  . JANUVIA 100 MG tablet Take 100 mg by mouth every evening.   11  . loratadine (CLARITIN) 10 MG tablet Take 10 mg by mouth daily.    . MULTAQ 400 MG tablet TAKE 1 TABLET BY MOUTH TWICE (2) DAILY (Patient taking differently: Take 400 mg by mouth 2 (two) times daily with a meal. ) 60 tablet 2  . omeprazole (PRILOSEC) 20 MG capsule Take 20 mg by mouth daily as needed (for acid reflux or heartburn).     .  pravastatin (PRAVACHOL) 40 MG tablet Take 1 tablet (40 mg total) by mouth daily with supper. 90 tablet 0  . warfarin (COUMADIN) 5 MG tablet Take As Directed by Coumadin Clinic (Patient taking differently: Take 7.5 mg by mouth every evening. ) 135 tablet 0   No current facility-administered medications for this visit.       Physical Exam:   BP 110/74   Pulse 82   Temp 97.8 F (36.6 C) (Skin)   Resp 20   Ht 5\' 4"  (1.626 m)   Wt 140 lb (63.5 kg)   LMP  (LMP Unknown)   SpO2 96% Comment: RA  BMI 24.03 kg/m   General:  Well-appearing  Chest:   Clear to auscultation  CV:   Irregular rate and rhythm with systolic murmur  Incisions:  n/a  Abdomen:  Soft nontender  Extremities:  Warm and well-perfused, no edema  Diagnostic Tests:  CT ANGIOGRAPHY CHEST, ABDOMEN AND PELVIS  TECHNIQUE: Multidetector CT imaging through the chest, abdomen and pelvis was performed using the standard protocol during bolus administration of intravenous contrast. Multiplanar reconstructed images and MIPs were obtained and reviewed to evaluate the vascular anatomy.  CONTRAST:  75mL ISOVUE-370 IOPAMIDOL (ISOVUE-370) INJECTION 76%  COMPARISON:  01/04/2016  FINDINGS: CTA CHEST FINDINGS  Cardiovascular: Normal caliber of the thoracic aorta without dissection. The aortic root at the sinuses of Valsalva measures 3.6  cm. Sinotubular junction measures 3.1 cm. Mid ascending thoracic aorta measures roughly 3.0 cm. Typical three-vessel arch anatomy. The great vessels are patent. Proximal bilateral vertebral arteries are patent. Mild atherosclerotic disease in the thoracic aorta. Small amount of calcified plaque at the origin of the left subclavian artery without significant stenosis. Proximal descending thoracic aorta measures 2.7 cm. Coronary artery calcifications involving the LAD, left circumflex and the right coronary artery. Mitral annular calcifications. Main pulmonary artery is enlarged measuring  3.9 cm and stable. Main pulmonary arteries are patent.  Mediastinum/Nodes: Subcarinal tissue measuring 1.6 cm in the short axis and previously measured 2.1 cm. Small upper mediastinal lymph nodes were poorly characterized on the previous examination due to contrast artifact. Largest lymph node in the upper mediastinum measures 1.0 cm in the short axis. Small supraclavicular lymph nodes bilaterally. Soft tissue in the prevascular region of the mediastinum measures 1.0 cm in the short axis previously measured 0.9 cm. No significant hilar lymphadenopathy.  Lungs/Pleura: Trachea and mainstem bronchi are patent. Pleural-based nodule in the right middle lobe on sequence 20, image 100 measures 9 x 7 mm and previously measured 8 x 7 mm. This nodule has not significantly changed. Focal thickening in the mid right lung on image 63 was probably present on the previous examination. On the prior examination, there was extensive airspace disease in both lungs, particularly on the right side which has resolved. Persistent focal opacity in the anterior right lower lobe on sequence 20, image 76. Patchy pleural-based densities in the bilateral lower lobes along the dependent aspect. Poorly defined opacity along the medial left upper lobe on sequence 21, image 60, measures up to 1.0 cm, and appears to be stable since 2017. Mild scarring at the lung apices. Mild posterior right pleural thickening. No significant pleural effusions.  Musculoskeletal: No acute bone abnormality.  Review of the MIP images confirms the above findings.  CTA ABDOMEN AND PELVIS FINDINGS  VASCULAR  Aorta: Mild atherosclerotic disease in the abdominal aorta without aneurysm or dissection.  Celiac: Patent without evidence of aneurysm, dissection, vasculitis or significant stenosis.  SMA: Patent without evidence of aneurysm, dissection, vasculitis or significant stenosis.  Renals: Both renal arteries are patent  without evidence of aneurysm, dissection, vasculitis, fibromuscular dysplasia or significant stenosis.  IMA: Patent without evidence of aneurysm, dissection, vasculitis or significant stenosis.  Inflow: Patent without evidence of aneurysm, dissection, vasculitis or significant stenosis.  Veins: No obvious venous abnormality within the limitations of this arterial phase study.  Review of the MIP images confirms the above findings.  NON-VASCULAR  Hepatobiliary: Normal appearance of the liver and gallbladder.  Pancreas: Unremarkable. No pancreatic ductal dilatation or surrounding inflammatory changes.  Spleen: Normal in size without focal abnormality.  Adrenals/Urinary Tract: Question some mild fullness in the right adrenal gland. Normal left adrenal gland. Normal appearance of both kidneys. No suspicious renal lesions. No hydronephrosis. Urinary bladder is decompressed.  Stomach/Bowel: Stomach is within normal limits. No evidence of bowel wall thickening, distention, or inflammatory changes.  Lymphatic: No abdominopelvic lymphadenopathy.  Reproductive: Uterus and bilateral adnexa are unremarkable.  Other: Small umbilical hernia containing fat. Negative for free fluid. Negative for free air.  Musculoskeletal: No acute bone abnormality.  Review of the MIP images confirms the above findings.  IMPRESSION: 1. Negative for an aortic aneurysm or dissection. 2. Mild aortic atherosclerosis. 3. Coronary artery calcifications. 4. Stable enlargement of the main pulmonary artery and may represent underlying pulmonary hypertension. 5. Mild mediastinal lymphadenopathy but no significant change from the  previous examination. 6. Stable poorly defined densities and nodules in the lungs. These nodular areas have not significantly changed since 2017 and likely represent benign entities. Markedly improved aeration in the lungs compared to the examination in 2017 but  scattered parenchymal densities could represent postinflammatory changes. 7. No acute abnormality in the chest, abdomen or pelvis.   Electronically Signed   By: Richarda OverlieAdam  Henn M.D.   On: 05/05/2019 17:02   Impression:  Patient has longstanding rheumatic heart disease with severe mitral stenosis and severe mitral regurgitation.  She also has longstanding history of paroxysmal atrial fibrillation which more recently has remained persistent.  She describes progressive symptoms of exertional shortness of breath and fatigue consistent with chronic diastolic congestive heart failure, New York Heart Association functional class IIb.  I have personally reviewed the patient's most recent transthoracic and transesophageal echocardiograms as well as her diagnostic cardiac catheterization.  Echocardiograms demonstrate classical rheumatic mitral valve disease with severe thickening and calcification of both the anterior and posterior leaflets.  There is severe thickening, effusion, and foreshortening of the subvalvular apparatus.  There is severe mitral stenosis and at least moderate if not severe mitral regurgitation.  There is severe left atrial enlargement.  Left ventricular size and function is normal.  The aortic valve appears normal.  There is mild to moderate pulmonary hypertension.  There is mild right ventricular chamber enlargement with moderate tricuspid regurgitation.  Functional anatomy of the tricuspid valve is not well elucidated and could be either type I or type IIIa dysfunction.  I agree the patient would best be treated with mitral valve replacement.  She may need concomitant tricuspid valve repair.  She might benefit from concomitant Maze procedure.  She appears to be reasonably good candidate for minimally invasive approach for surgery.  CT angiography reveals no contraindication to peripheral cannulation for surgery.    Plan:  The patient was again counseled at length regarding the  indications, risks and potential benefits of mitral valve replacement.  The rationale for elective surgery has been explained, including a comparison between surgery and continued medical therapy with close follow-up.   The possibility of need for concomitant tricuspid valve repair was discussed.  The relative risks and benefits of performing a maze procedure at the time of her surgery was discussed at length, including the expected likelihood of long term freedom from recurrent symptomatic atrial fibrillation and/or atrial flutter.  Alternative surgical approaches have been discussed including a comparison between conventional sternotomy and minimally-invasive techniques.  The relative risks and benefits of each have been reviewed as they pertain to the patient's specific circumstances, and expectations for the patient's postoperative convalescence has been discussed.  Expectations for the patient's postoperative convalescence have been discussed.   Finally, we discussed the possibility of replacing the mitral valve using a mechanical prosthesis with the attendant need for long-term anticoagulation versus the alternative of replacing it using a bioprosthetic tissue valve with its potential for late structural valve deterioration and failure, depending upon the patient's longevity.  The patient specifically requests that if the mitral valve must be replaced that it be done using a mechanical valve.     The patient understands and accepts all potential risks of surgery including but not limited to risk of death, stroke or other neurologic complication, myocardial infarction, congestive heart failure, respiratory failure, renal failure, bleeding requiring transfusion and/or reexploration, arrhythmia, infection or other wound complications, pneumonia, pleural and/or pericardial effusion, pulmonary embolus, aortic dissection or other major vascular complication, or delayed complications  related to valve repair or  replacement including but not limited to structural valve deterioration and failure, thrombosis, embolization, endocarditis, or paravalvular leak.  Specific risks potentially related to the minimally-invasive approach were discussed at length, including but not limited to risk of conversion to full or partial sternotomy, aortic dissection or other major vascular complication, unilateral acute lung injury or pulmonary edema, phrenic nerve dysfunction or paralysis, rib fracture, chronic pain, lung hernia, or lymphocele. All of her questions have been answered.    I spent in excess of 15 minutes during the conduct of this office consultation and >50% of this time involved direct face-to-face encounter with the patient for counseling and/or coordination of their care.    Valentina Gu. Roxy Manns, MD 05/22/2019 1:41 PM

## 2019-05-22 NOTE — Progress Notes (Addendum)
Patient denies shortness of breath, fever, cough and chest pain at PAT appointment  PCP -  Dr Christa See, [place] Cardiologist - Dr Cloria Spring   Chest x-ray - 05/22/19 EKG - 05/22/19 Stress Test - Denies ECHO - 09/14/18 Cardiac Cath - 04/07/19  Fasting Blood Sugar -  125s Checks Blood Sugar __1___ times a day  Blood Thinner Instructions: Lovenox last dose 05/22/19 and Coumadin last dose 05/16/19 per patient.  Anesthesia review: yes  Coronavirus Screening Have you or your Sister Renee experienced the following symptoms:  Cough yes/no: No Fever (>100.36F)  yes/no: No Runny nose yes/no: No Sore throat yes/no: No Difficulty breathing/shortness of breath  yes/no: No  Have you or your sister traveled in the last 14 days and where? yes/no: No  Lab called, need to redraw Stat CMP on DOS, tube mislabeled. Stat CMP order placed in Epic for DOS.  I

## 2019-05-22 NOTE — Progress Notes (Signed)
Pre MVR Dopplers completed. Preliminary results in Chart review CV Proc. [facility], RVS 05/22/2019,11:12 AM

## 2019-05-23 ENCOUNTER — Encounter (HOSPITAL_COMMUNITY): Payer: Self-pay | Admitting: Certified Registered Nurse Anesthetist

## 2019-05-23 ENCOUNTER — Other Ambulatory Visit: Payer: Self-pay | Admitting: Cardiology

## 2019-05-23 LAB — SARS CORONAVIRUS 2 (TAT 6-24 HRS): SARS Coronavirus 2: NEGATIVE

## 2019-05-23 MED ORDER — MILRINONE LACTATE IN DEXTROSE 20-5 MG/100ML-% IV SOLN
0.3000 ug/kg/min | INTRAVENOUS | Status: DC
  Filled 2019-05-23: qty 100

## 2019-05-23 MED ORDER — DEXMEDETOMIDINE HCL IN NACL 400 MCG/100ML IV SOLN
0.1000 ug/kg/h | INTRAVENOUS | Status: AC
  Administered 2019-05-24: 08:00:00 .7 ug/kg/h via INTRAVENOUS
  Filled 2019-05-23: qty 100

## 2019-05-23 MED ORDER — SODIUM CHLORIDE 0.9 % IV SOLN
1.5000 g | INTRAVENOUS | Status: AC
  Administered 2019-05-24: .75 g via INTRAVENOUS
  Administered 2019-05-24: 1.5 g via INTRAVENOUS
  Filled 2019-05-23: qty 1.5

## 2019-05-23 MED ORDER — EPINEPHRINE PF 1 MG/ML IJ SOLN
0.0000 ug/min | INTRAVENOUS | Status: DC
  Filled 2019-05-23: qty 4

## 2019-05-23 MED ORDER — PLASMA-LYTE 148 IV SOLN
INTRAVENOUS | Status: AC
  Administered 2019-05-24: 500 mL
  Filled 2019-05-23: qty 2.5

## 2019-05-23 MED ORDER — POTASSIUM CHLORIDE 2 MEQ/ML IV SOLN
80.0000 meq | INTRAVENOUS | Status: DC
  Filled 2019-05-23: qty 40

## 2019-05-23 MED ORDER — SODIUM CHLORIDE 0.9 % IV SOLN
750.0000 mg | INTRAVENOUS | Status: DC
  Filled 2019-05-23: qty 750

## 2019-05-23 MED ORDER — VANCOMYCIN HCL 10 G IV SOLR
1250.0000 mg | INTRAVENOUS | Status: AC
  Administered 2019-05-24: 08:00:00 1250 mg via INTRAVENOUS
  Filled 2019-05-23: qty 1250

## 2019-05-23 MED ORDER — VANCOMYCIN HCL 1000 MG IV SOLR
INTRAVENOUS | Status: DC
  Filled 2019-05-23: qty 1000

## 2019-05-23 MED ORDER — MANNITOL 20 % IV SOLN
Freq: Once | INTRAVENOUS | Status: DC
  Filled 2019-05-23: qty 13

## 2019-05-23 MED ORDER — TRANEXAMIC ACID (OHS) PUMP PRIME SOLUTION
2.0000 mg/kg | INTRAVENOUS | Status: DC
  Filled 2019-05-23: qty 1.27

## 2019-05-23 MED ORDER — NITROGLYCERIN IN D5W 200-5 MCG/ML-% IV SOLN
2.0000 ug/min | INTRAVENOUS | Status: DC
  Filled 2019-05-23: qty 250

## 2019-05-23 MED ORDER — INSULIN REGULAR(HUMAN) IN NACL 100-0.9 UT/100ML-% IV SOLN
INTRAVENOUS | Status: AC
  Administered 2019-05-24: 08:00:00 1 [IU]/h via INTRAVENOUS
  Filled 2019-05-23: qty 100

## 2019-05-23 MED ORDER — TRANEXAMIC ACID 1000 MG/10ML IV SOLN
1.5000 mg/kg/h | INTRAVENOUS | Status: AC
  Administered 2019-05-24: 1.5 mg/kg/h via INTRAVENOUS
  Filled 2019-05-23: qty 25

## 2019-05-23 MED ORDER — SODIUM CHLORIDE 0.9 % IV SOLN
INTRAVENOUS | Status: DC
  Filled 2019-05-23: qty 30

## 2019-05-23 MED ORDER — PHENYLEPHRINE HCL-NACL 20-0.9 MG/250ML-% IV SOLN
30.0000 ug/min | INTRAVENOUS | Status: AC
  Administered 2019-05-24: 15 ug/min via INTRAVENOUS
  Filled 2019-05-23: qty 250

## 2019-05-23 MED ORDER — GLUTARALDEHYDE 0.625% SOAKING SOLUTION
TOPICAL | Status: DC
  Filled 2019-05-23: qty 50

## 2019-05-23 MED ORDER — DOPAMINE-DEXTROSE 3.2-5 MG/ML-% IV SOLN
0.0000 ug/kg/min | INTRAVENOUS | Status: DC
  Filled 2019-05-23: qty 250

## 2019-05-23 MED ORDER — TRANEXAMIC ACID (OHS) BOLUS VIA INFUSION
15.0000 mg/kg | INTRAVENOUS | Status: AC
  Administered 2019-05-24: 952.5 mg via INTRAVENOUS
  Filled 2019-05-23: qty 953

## 2019-05-23 NOTE — H&P (Signed)
[address]       [place] [address]             [phone]          CARDIOTHORACIC SURGERY HISTORY AND PHYSICAL EXAM  Referring Provider is Park Liter, MD PCP is Street, Sharon Mt, MD  Chief Complaint  Patient presents with   Mitral Stenosis    new patient consultation, Cath 04/07/2019, TEE 01/06/2019   Mitral Regurgitation     HPI:  Patient is a 63 year old female referred for surgical consultation to discuss treatment options for rheumatic heart disease with combination of severe mitral stenosis, mitral regurgitation, chronic diastolic congestive heart failure, and atrial fibrillation.  Patient states that she became ill at age 11 and has a heart murmur ever since.  She was later diagnosed with rheumatic heart disease.  She has been followed intermittently for many years.  In 2008 she first developed paroxysmal atrial fibrillation.  She was found to have mitral stenosis and mitral regurgitation.  She has been chronically anticoagulated using warfarin ever since.  For the last several years she has been followed by Dr. Agustin Cree.  Echocardiograms have documented the presence of normal left ventricular systolic function with severe mitral stenosis and mitral regurgitation.  Over the past 6 to 12 months the patient has developed decreased exercise tolerance with increasing exertional shortness of breath and fatigue.  Transthoracic echocardiogram performed September 14, 2018 revealed normal left ventricular systolic function with severe rheumatic mitral valve disease including severe mitral stenosis and moderate mitral regurgitation.  There was severe left atrial enlargement and evidence of pulmonary hypertension.  There was mild tricuspid regurgitation.  The patient was referred to Dr. Mina Marble at [facility] to consider balloon mitral valvuloplasty.  Transesophageal echocardiogram was recommended and performed on January 06, 2019.  This  confirmed the presence of severe rheumatic mitral valve disease with severe thickening and calcification of the mitral valve leaflets.  There was severe mitral stenosis and severe mitral regurgitation.  There was moderate tricuspid regurgitation.  Left ventricular systolic function remain normal.  The aortic valve was normal.  The patient's mitral valve was felt not suitable for balloon mitral valvuloplasty.  Referral for possible mitral valve replacement was delayed because of the COVID-19 pandemic.  Eventually the patient underwent left and right heart catheterization on April 07, 2019.  The patient was found to have mild nonobstructive coronary artery disease.  There was mild to moderate pulmonary hypertension and normal left ventricular end-diastolic pressure.  Cardiothoracic surgical consultation was requested.  Patient is widowed and lives alone in [place].  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  Her sister lives nearby and is very supportive, accompanying her for her office consultation visit today.  They both enjoy walking and line dancing.  The patient complains that over the last 6 to 12 months she has developed progressive symptoms of exertional shortness of breath and fatigue.  She gets short of breath quickly if she walks up an incline.  She denies resting shortness of breath, PND, orthopnea, or lower extremity edema.  She has intermittent palpitations and at times can feel her heart beating erratically.  She denies any chest pain or chest tightness either with activity or at rest.  She has had occasional slight dizzy spells without any syncopal episodes.  She has been chronically anticoagulated using warfarin since 2008.  She has not had any significant bleeding complications nor any history of  TIA or stroke.  Patient is a 63 year old female with history of rheumatic heart disease including severe mitral stenosis, mitral regurgitation, chronic diastolic congestive heart failure  and persistent atrial fibrillation who returns to the office today with tentative plans to proceed with minimally invasive mitral valve replacement, possible tricuspid valve repair, and Maze procedure later this week.  She was originally seen in consultation on April 20, 2019.  She underwent CT angiography which reveals no contraindication to peripheral cannulation for surgery.  She reports no new problems or complaints over the past few weeks.  She has been practicing social distancing, wearing a mask whenever in public, and she has not been exposed to any persons with known or suspected COVID-19 infection.  She describes stable symptoms of exertional shortness of breath.  Past Medical History:  Diagnosis Date   CHF (congestive heart failure) ([place]) 2017   Diabetes mellitus without complication (HCC)    type 2   GERD (gastroesophageal reflux disease)    Hyperlipidemia    Hyperlipidemia    no meds, diet controlled   Paroxysmal atrial fibrillation ([place]) 01/20/2016   Overview:  Chads score 2 anticoagulated with warfarin   Rheumatic heart disease    Rheumatic mitral regurgitation 01/20/2016   Overview:  Mild to moderate   Severe mitral valve stenosis 01/20/2016   Tricuspid regurgitation     Past Surgical History:  Procedure Laterality Date   RIGHT/LEFT HEART CATH AND CORONARY ANGIOGRAPHY N/A 04/07/2019   Procedure: RIGHT/LEFT HEART CATH AND CORONARY ANGIOGRAPHY;  Surgeon: Leonie Man, MD;  Location: [facility];  Service: Cardiovascular;  Laterality: N/A;   TEE WITHOUT CARDIOVERSION N/A 01/06/2019   Procedure: TRANSESOPHAGEAL ECHOCARDIOGRAM (TEE);  Surgeon: Dorothy Spark, MD;  Location: [facility] ENDOSCOPY;  Service: Cardiovascular;  Laterality: N/A;   WISDOM TOOTH EXTRACTION      Family History  Problem Relation Age of Onset   Bladder Cancer Mother    Heart attack Mother    Hypothyroidism Mother    Heart disease Father    Stroke Father    Diabetes Father      Social History Social History   Tobacco Use   Smoking status: Never Smoker   Smokeless tobacco: Never Used  Substance Use Topics   Alcohol use: No   Drug use: No    Prior to Admission medications   Medication Sig Start Date End Date Taking? Authorizing Provider  Chromium (CHROMEMATE PO) Take 600 mg by mouth daily.    Yes [provider]  clobetasol cream (TEMOVATE) 6.72 % Apply 1 application topically 2 (two) times daily as needed (for lichen sclerosus).    Yes [provider]  digoxin (DIGOX) 0.25 MG tablet Take 1 tablet (250 mcg total) by mouth daily. 01/31/19  Yes Park Liter, MD  enoxaparin (LOVENOX) 100 MG/ML injection Inject 1 mL (100 mg total) into the skin daily. 05/02/19  Yes Park Liter, MD  JANUVIA 100 MG tablet Take 100 mg by mouth every evening.  04/14/17  Yes [provider]  loratadine (CLARITIN) 10 MG tablet Take 10 mg by mouth daily.   Yes [provider]  MULTAQ 400 MG tablet TAKE 1 TABLET BY MOUTH TWICE (2) DAILY Patient taking differently: Take 400 mg by mouth 2 (two) times daily with a meal.  05/02/19  Yes Park Liter, MD  omeprazole (PRILOSEC) 20 MG capsule Take 20 mg by mouth daily as needed (for acid reflux or heartburn).  10/25/15  Yes [provider]  pravastatin (PRAVACHOL) 40 MG tablet Take 1 tablet (40 mg total) by mouth daily with supper. 11/23/18  Yes Park Liter, MD  warfarin (COUMADIN) 5 MG tablet Take As Directed by [facility] Patient taking differently: Take 7.5 mg by mouth every evening.  03/21/19  Yes Park Liter, MD    Allergies  Allergen Reactions   Adhesive [Tape] Other (See Comments)    Skin irritation   Penicillins Rash and Other (See Comments)    Did it involve swelling of the face/tongue/throat, SOB, or low BP? No Did it involve sudden or severe rash/hives, skin peeling, or any reaction on the inside of your mouth or nose? No Did you need to seek  medical attention at a hospital or doctor's office? No When did it last happen?childhood allergy If all above answers are "NO", may proceed with cephalosporin use.     Review of Systems:  General: normal appetite, decreased energy, no weight gain, no weight loss, no fever  Cardiac: no chest pain with exertion, no chest pain at rest, + SOB with exertion, no resting SOB, no PND, no orthopnea, + palpitations, + arrhythmia, + atrial fibrillation, no LE edema, occasional dizzy spells, no syncope  Respiratory: no shortness of breath, no home oxygen, no productive cough, no dry cough, no bronchitis, no wheezing, no hemoptysis, no asthma, no pain with inspiration or cough, no sleep apnea, no CPAP at night  GI: no difficulty swallowing, no reflux, no frequent heartburn, no hiatal hernia, no abdominal pain, no constipation, no diarrhea, no hematochezia, no hematemesis, no melena  GU: no dysuria, no frequency, no urinary tract infection, no hematuria, no kidney stones, no kidney disease  Vascular: no pain suggestive of claudication, no pain in feet, no leg cramps, no varicose veins, no DVT, no non-healing foot ulcer  Neuro: no stroke, no TIA's, no seizures, no headaches, no temporary blindness one eye, no slurred speech, no peripheral neuropathy, no chronic pain, no instability of gait, no memory/cognitive dysfunction  Musculoskeletal: no arthritis, no joint swelling, no myalgias, no difficulty walking, normal mobility  Skin: no rash, no itching, no skin infections, no pressure sores or ulcerations  Psych: no anxiety, no depression, no nervousness, no unusual recent stress  Eyes: no blurry vision, no floaters, no recent vision changes, + wears glasses or contacts  ENT: no hearing loss, no loose or painful teeth, no dentures, last saw dentist January 2020  Hematologic: no easy bruising, no abnormal bleeding, no clotting disorder, no frequent epistaxis  Endocrine: + diabetes, does check CBG's at home    Physical Exam:  BP 112/61 (BP Location: Left Arm, Patient Position: Sitting, Cuff Size: Normal)   Pulse 73   Temp 97.9 F (36.6 C)   Resp 18   Ht 5' 4"  (1.626 m)   Wt 144 lb 6.4 oz (65.5 kg)   SpO2 94% Comment: RA   BMI 24.79 kg/m  General: well-appearing  HEENT: Unremarkable  Neck: no JVD, no bruits, no adenopathy  Chest: clear to auscultation, symmetrical breath sounds, no wheezes, no rhonchi  CV: Irregular rate and rhythm, grade III/VI holosystolic murmur heard best at apex, no diastolic murmur  Abdomen: soft, non-tender, no masses  Extremities: warm, well-perfused, pulses diminished but palpable, no LE edema  Rectal/GU Deferred  Neuro: Grossly non-focal and symmetrical throughout  Skin: Clean and dry, no rashes, no breakdown  Diagnostic Tests:  ----------------------------------------------------------  Transthoracic Echocardiography  Patient: Elanore, Talcott  MR #: [id number]  Study Date: 09/14/2018  Gender: F  Age: 35  Height: 162.6 cm  Weight: 67.9 kg  BSA: 1.76 m^2  Pt. Status:  Room:  ATTENDING Jenne Campus, MD  ORDERING Jenne Campus, MD  REFERRING Jenne Campus, MD  SONOGRAPHER Jimmy Reel, RDCS  PERFORMING [facility], [place]  cc:  -------------------------------------------------------------------  LV EF: 55% - 60%  -------------------------------------------------------------------  Indications: Rheumatic AS/MS 396.0.  -------------------------------------------------------------------  History: PMH: Atrial fibrillation.  -------------------------------------------------------------------  Study Conclusions  - Left ventricle: The cavity size was normal. There was mild  concentric hypertrophy. Systolic function was normal. The  estimated ejection fraction was in the range of 55% to 60%. Wall  motion was normal; there were no regional wall motion  abnormalities.  - Aortic valve: Valve area (VTI): 1.67 cm^2. Valve area (Vmax):  1.75 cm^2. Valve area  (Vmean): 1.71 cm^2.  - Mitral valve: Calcification. Mobility was restricted. The  findings are consistent with severe stenosis. There was moderate  regurgitation. Valve area by pressure half-time: 1.09 cm^2. Valve  area by continuity equation (using LVOT flow): 0.7 cm^2.  - Left atrium: The atrium was severely dilated.  - Pulmonary arteries: PA peak pressure: 39 mm Hg (S).  Impressions:  - Normal LVEF.  Severe LAE.  Severe MS (rheumatic?)  Moderate MR  Mild TR.  PAP 39 mmHg.  -------------------------------------------------------------------  Study data: No prior study was available for comparison. Study  status: Routine. Procedure: The patient reported no pain pre or  post test. Transthoracic echocardiography. Image quality was  adequate. Transthoracic echocardiography. M-mode,  complete 2D, spectral Doppler, and color Doppler. Birthdate:  Patient birthdate: May 23, 1956. Age: Patient is 63 yr old. Sex:  Gender: female. BMI: 25.7 kg/m^2. Blood pressure: 118/62  Patient status: Outpatient. Study date: Study date: 09/14/2018.  Study time: 11:10 AM. Location: Echo laboratory.  -------------------------------------------------------------------  -------------------------------------------------------------------  Left ventricle: The cavity size was normal. There was mild  concentric hypertrophy. Systolic function was normal. The estimated  ejection fraction was in the range of 55% to 60%. Wall motion was  normal; there were no regional wall motion abnormalities.  -------------------------------------------------------------------  Aortic valve: Trileaflet; normal thickness leaflets. Mobility was  not restricted. Doppler: Transvalvular velocity was within the  normal range. There was no stenosis. There was no regurgitation.  VTI ratio of LVOT to aortic valve: 0.59. Valve area (VTI): 1.67  cm^2. Indexed valve area (VTI): 0.95 cm^2/m^2. Peak velocity ratio  of LVOT to aortic valve: 0.62.  Valve area (Vmax): 1.75 cm^2.  Indexed valve area (Vmax): 0.99 cm^2/m^2. Mean velocity ratio of  LVOT to aortic valve: 0.6. Valve area (Vmean): 1.71 cm^2. Indexed  valve area (Vmean): 0.97 cm^2/m^2. Mean gradient (S): 9 mm Hg.  Peak gradient (S): 17 mm Hg.  -------------------------------------------------------------------  Aorta: Aortic root: The aortic root was normal in size.  -------------------------------------------------------------------  Mitral valve: Calcification. Mobility was restricted. Doppler:  The findings are consistent with severe stenosis. There was  moderate regurgitation. Valve area by pressure half-time: 1.09  cm^2. Indexed valve area by pressure half-time: 0.62 cm^2/m^2.  Valve area by continuity equation (using LVOT flow): 0.7 cm^2.  Indexed valve area by continuity equation (using LVOT flow): 0.4  cm^2/m^2. Mean gradient (D): 12 mm Hg. Peak gradient (D): 21 mm  Hg.  -------------------------------------------------------------------  Left atrium: The atrium was severely dilated.  -------------------------------------------------------------------  Right ventricle: The cavity size was normal. Wall thickness was  normal. Systolic function was normal.  -------------------------------------------------------------------  Pulmonic valve: Structurally normal valve. Cusp separation was  normal. Doppler: Transvalvular velocity was within the normal  range.  There was no evidence for stenosis. There was trivial  regurgitation.  -------------------------------------------------------------------  Tricuspid valve: Structurally normal valve. Doppler:  Transvalvular velocity was within the normal range. There was mild  regurgitation.  -------------------------------------------------------------------  Pulmonary artery: The main pulmonary artery was normal-sized.  Systolic pressure was within the normal range.    -------------------------------------------------------------------  Right atrium: The atrium was normal in size.  -------------------------------------------------------------------  Pericardium: There was no pericardial effusion.  -------------------------------------------------------------------  Systemic veins:  Inferior vena cava: The vessel was normal in size.  -------------------------------------------------------------------  Measurements  Left ventricle Value Reference  LV ID, ED, PLAX chordal (H) 54 mm 43 - 52  LV ID, ES, PLAX chordal 38 mm 23 - 38  LV fx shortening, PLAX chordal 30 % >=29  LV PW thickness, ED 13 mm ----------  IVS/LV PW ratio, ED 1 <=1.3  Stroke volume, 2D 77 ml ----------  Stroke volume/bsa, 2D 44 ml/m^2 ----------  LV e&', lateral 3.66 cm/s ----------  LV E/e&', lateral 62.57 ----------  LV e&', medial 3.43 cm/s ----------  LV E/e&', medial 66.76 ----------  LV e&', average 3.55 cm/s ----------  LV E/e&', average 64.6 ----------  Ventricular septum Value Reference  IVS thickness, ED 13 mm ----------  LVOT Value Reference  LVOT ID, S 19 mm ----------  LVOT area 2.84 cm^2 ----------  LVOT peak velocity, S 129 cm/s ----------  LVOT mean velocity, S 83.6 cm/s ----------  LVOT VTI, S 27.2 cm ----------  LVOT peak gradient, S 7 mm Hg ----------  Aortic valve Value Reference  Aortic valve peak velocity, S 209 cm/s ----------  Aortic valve mean velocity, S 139 cm/s ----------  Aortic valve VTI, S 46.3 cm ----------  Aortic mean gradient, S 9 mm Hg ----------  Aortic peak gradient, S 17 mm Hg ----------  VTI ratio, LVOT/AV 0.59 ----------  Aortic valve area, VTI 1.67 cm^2 ----------  Aortic valve area/bsa, VTI 0.95 cm^2/m^2 ----------  Velocity ratio, peak, LVOT/AV 0.62 ----------  Aortic valve area, peak velocity 1.75 cm^2 ----------  Aortic valve area/bsa, peak 0.99 cm^2/m^2 ----------  velocity  Velocity ratio, mean, LVOT/AV 0.6 ----------   Aortic valve area, mean velocity 1.71 cm^2 ----------  Aortic valve area/bsa, mean 0.97 cm^2/m^2 ----------  velocity  Aorta Value Reference  Aortic root ID, ED 36 mm ----------  Left atrium Value Reference  LA ID, A-P, ES 43 mm ----------  LA ID/bsa, A-P (H) 2.44 cm/m^2 <=2.2  LA volume, S 100 ml ----------  LA volume/bsa, S 56.7 ml/m^2 ----------  LA volume, ES, 1-p A4C 102 ml ----------  LA volume/bsa, ES, 1-p A4C 57.8 ml/m^2 ----------  LA volume, ES, 1-p A2C 90.4 ml ----------  LA volume/bsa, ES, 1-p A2C 51.2 ml/m^2 ----------  Mitral valve Value Reference  Mitral E-wave peak velocity 229 cm/s ----------  Mitral A-wave peak velocity 196 cm/s ----------  Mitral mean velocity, D 159 cm/s ----------  Mitral deceleration time (H) 687 ms 150 - 230  Mitral pressure half-time 201 ms ----------  Mitral mean gradient, D 12 mm Hg ----------  Mitral peak gradient, D 21 mm Hg ----------  Mitral E/A ratio, peak 1.2 ----------  Mitral valve area, PHT, DP 1.09 cm^2 ----------  Mitral valve area/bsa, PHT, DP 0.62 cm^2/m^2 ----------  Mitral valve area, LVOT 0.7 cm^2 ----------  continuity  Mitral valve area/bsa, LVOT 0.4 cm^2/m^2 ----------  continuity  Mitral annulus VTI, D 110 cm ----------  Mitral maximal regurg velocity, 550 cm/s ----------  PISA  Mitral regurg VTI, PISA 186 cm ----------  Mitral ERO, PISA 0.22 cm^2 ----------  Mitral regurg volume, PISA 41 ml ----------  Pulmonary arteries Value Reference  PA pressure, S, DP (H) 39 mm Hg <=30  Tricuspid valve Value Reference  Tricuspid regurg peak velocity 301 cm/s ----------  Tricuspid peak RV-RA gradient 36 mm Hg ----------  Right atrium Value Reference  RA ID, S-I, ES, A4C (H) 56 mm 34 - 49  RA area, ES, A4C 14.3 cm^2 8.3 - 19.5  RA volume, ES, A/L 32.2 ml ----------  RA volume/bsa, ES, A/L 18.3 ml/m^2 ----------  Right ventricle Value Reference  TAPSE 19.4 mm ----------  RV pressure, S, DP (H) 39 mm Hg <=30  RV s&',  lateral, S 12.3 cm/s ----------  Legend:  (L) and (H) mark values outside specified reference range.  -------------------------------------------------------------------  Prepared and Electronically Authenticated by  Jenne Campus, MD  2019-11-20T17:09:51  TRANSESOPHOGEAL ECHO REPORT  Patient Name: Michael Litter Date of Exam: 01/06/2019  Medical Rec #: [id number] Height: 64.0 in  Accession #: [id number] Weight: 142.0 lb  Date of Birth: August 25, 1956 BSA: 1.69 m  Patient Age: 74 years BP: 126/82 mmHg  Patient Gender: F HR: 72 bpm.  Exam Location: Outpatient  Procedure: Transesophageal Echo  Indications: rheumatic mitral valve disease  History: Patient has prior history of Echocardiogram examinations, most  recent 09/14/2018. Mitral Valve Disease, Mitral Stenosis and  Mild aortic stenosis. Rheumatic heart disease.  Sonographer: Roseanna Rainbow  Referring Phys: [id number] Newton Hamilton  Diagnosing Phys: Ena Dawley MD  PROCEDURE: Normal Transesophogeal exam. Local oropharyngeal anesthetic was provided with viscous lidocaine. The transesophogeal probe was passed through the esophogus of the patient. Imaged were obtained with the patient in a left lateral decubitus  position. Image quality was excellent. The patient developed no complications during the procedure.  IMPRESSIONS  1. The right ventricle has normal systolc function. Right ventricular systolic pressure is moderately elevated.  2. Left atrial size was severely dilated.  3. Right atrial size was mildly dilated.  4. Moderately thickened tricuspid valve leaflets.  5. The mitral valve is rheumatic. Severe thickening of the mitral valve leaflet. Severe calcification of the mitral valve leaflet. Mitral valve regurgitation is severe by color flow Doppler. The MR jet is wall-impinging. Critical mitral valve stenosis.  6. Tricuspid valve regurgitation is moderate.  7. The left ventricle has hyperdynamic systolic function, with an  ejection fraction of >65%.  SUMMARY  Findings consistent with rheumatic mitral valve disease with  severely thickened and calcified leaflets. (Wilkins score 13)  Severe mitral stenosis with mean transmitral gradient 16 mmHg.  Severe mitral regurgitation with an eccentric jet and reversal of  forward flow in the pulmonary veins.  Moderate pulmonary hypertension with RVSP 47 mmHg.  Severely dilated left atrium.  FINDINGS  Left Ventricle: The left ventricle has hyperdynamic systolic function, with an ejection fraction of >65%.  Right Ventricle: The right ventricle has normal systolic function. Right ventricular systolic pressure is moderately elevated.  Left Atrium: Left atrial size was severely dilated.  Right Atrium: Right atrial size was mildly dilated. Right atrial pressure is estimated at 15 mmHg.  Mitral Valve: The mitral valve is rheumatic. Severe thickening of the mitral valve leaflet. Severe calcification of the mitral valve leaflet. Mitral valve regurgitation is severe by color flow Doppler. The MR jet is wall-impinging. Critical mitral valve  stenosis.  Tricuspid Valve: Tricuspid valve regurgitation is moderate by color flow Doppler. The tricuspid valve is moderately thickened.  Venous: The inferior vena cava was not well  visualized.  LV Wall Scoring:  RIGHT ATRIUM  RA Pressure: 15 mmHg  MITRAL VALVE  MV Peak grad: 54.8 mmHg  MV Mean grad: 32.5 mmHg  MV Vmax: 3.70 m/s  MV Vmean: 253.0 cm/s  MV VTI: 1.11 m  MR Peak grad: 105.3 mmHg  MR Mean grad: 54.0 mmHg  MR Vmax: 513.00 cm/s  MR Vmean: 328.0 cm/s  MR PISA: 9.05 cm  MR PISA Eff ROA: 66 mm  MR PISA Radius: 1.20 cm  Ena Dawley MD  Electronically signed by Ena Dawley MD  Signature Date/Time: 01/06/2019/1:33:08 PM  RIGHT/LEFT HEART CATH AND CORONARY ANGIOGRAPHY  Conclusion  Hemodynamic findings consistent with mild pulmonary hypertension.  LV end diastolic pressure is low  Mid LAD lesion is 40% stenosed.  Otherwise normal coronary arteries SUMMARY  Mild Pulmonary HTN with normal LVEDP  Moderately reduced Cardiac Output / Index by FICK (Valve disease related)  With exception of mild LAD stenosis after D1, relatively normal coronary Arteries - R dominant RECOMMENDATIONS  Discharge home after bedrest  Follow-up with Dr. Agustin Cree as scheduled.  Proceed with plans for valve surgery Glenetta Hew, M.D., M.S.  Interventional Cardiologist  Pager # [phone]  Phone # [phone]  [address] [place], [place] [address]   Recommendations  Antiplatelet/Anticoag No indication for antiplatelet therapy at this time .  Discharge Date In the absence of any other complications or medical issues, we expect the patient to be ready for discharge from a cath perspective on 04/07/2019.  Indications  Severe mitral stenosis by prior echocardiogram [I05.0 (ICD-10-CM)]  Chronic rheumatic heart disease [I09.9 (ICD-10-CM)]  Rheumatic mitral regurgitation [I05.1 (ICD-10-CM)]  Procedural Details  Technical Details PCP: Dr. Christa See Cardiologist: Dr. Arlana Hove is a 63 y.o. female with history of rheumatic heart disease resulting in severe mitral stenosis with regurgitation. She has now become symptomatic and is therefore starting stages of preoperative evaluation for valve replacement. She presents here for right and left heart catheterization.  Time Out: Verified patient identification, verified procedure, site/side was marked, verified correct patient position, special equipment/implants available, medications/allergies/relevent history reviewed, required imaging and test results available. Performed.   Access:  * RIGHT Radial Artery: 6 Fr sheath -- Seldinger technique using Angiocath Kit -- Direct ultrasound guidance used. Permanent image obtained and placed on chart. --10 mL radial cocktail IA; 3500 Units IV Heparin * Right Brachial/Antecubital Vein: The existing  18-gauge IV was exchanged over a wire for a 5Fr sheath  Right Heart Catheterization: 5 Fr Gordy Councilman catheter advanced under fluoroscopy with balloon inflated to the RA, RV, then PCWP-PA for hemodynamic measurement.  * Simultaneous FA & PA blood gases checked for SaO2% to calculate FICK CO/CI  * Catheter removed completely out of the body with balloon deflated.  Left Heart Catheterization: 5Fr Catheters advanced or exchanged over a J-wire under direct fluoroscopic guidance into the ascending aorta; TIG 4.0 catheter advanced first.  * Left & Right Coronary Artery Cineangiography: TIG 4.0 Catheter  * LV Hemodynamics (LV Gram): Angled Pigtail Catheter  Upon completion of LV Hemodynamcs & Aortic Vavle pull-back gradient measurement, the catheter was removed completely out of the body over a wire, without complication.  Brachial Sheath(s) removed in the Cath Lab with manual pressure for hemostasis.   Radial sheath removed in the Cardiac Catheterization lab with TR Band placed for hemostasis.  TR Band: 0830 Hours; 11 mL air  MEDICATIONS * SQ Lidocaine 48m * Radial Cocktail: 3 mg Verapmil in 10 mL  NS * Isovue Contrast: 40 mL * Heparin: 3500 Units  Fluoro time: 5.8 minutes. Dose Area Product: 54270 mGycm2. Cumulative Air Kerma: 261 mGy.  Estimated blood loss <50 mL.   During this procedure medications were administered to achieve and maintain moderate conscious sedation while the patient's heart rate, blood pressure, and oxygen saturation were continuously monitored and I was present face-to-face 100% of this time.  Medications  (Filter: Administrations occurring from 04/07/19 0726 to 04/07/19 0836)          (important) Continuous medications are totaled by the amount administered until 04/07/19 0836.  Medication Rate/Dose/Volume Action  Date Time   Heparin (Porcine) in NaCl 1000-0.9 UT/500ML-% SOLN (mL) 500 mL Given 04/07/19 0741   Total dose as of 04/07/19 0836 500 mL Given 0742   1,000  mL        fentaNYL (SUBLIMAZE) injection (mcg) 25 mcg Given 04/07/19 0751   Total dose as of 04/07/19 0836        25 mcg        midazolam (VERSED) injection (mg) 1 mg Given 04/07/19 0751   Total dose as of 04/07/19 0836        1 mg        lidocaine (PF) (XYLOCAINE) 1 % injection (mL) 3 mL Given 04/07/19 0751   Total dose as of 04/07/19 0836        3 mL        Radial Cocktail/Verapamil only (mL) 10 mL Given 04/07/19 0755   Total dose as of 04/07/19 0836        10 mL        heparin injection (Units) 3,500 Units Given 04/07/19 0807   Total dose as of 04/07/19 0836        3,500 Units        iohexol (OMNIPAQUE) 350 MG/ML injection (mL) 40 mL Given 04/07/19 0822   Total dose as of 04/07/19 0836        40 mL        Complications  Complications documented before study signed (04/07/2019 6:23 AM)   No complications were associated with this study.  Documented by Leonie Man, MD - 04/07/2019 8:43 AM  Coronary Findings  Diagnostic  Dominance: Right  Left Main  Vessel was injected. Vessel is normal in caliber. Vessel is angiographically normal.  Left Anterior Descending  Vessel was injected. Vessel is normal in caliber. Vessel is angiographically normal.  Mid LAD lesion 40% stenosed  Mid LAD lesion is 40% stenosed. The lesion is located at the major branch, focal and eccentric.  First Diagonal Branch  Vessel is moderate in size. Moderate-large caliber vessel  First Septal Branch  Vessel is small in size.  Second Diagonal Branch  Vessel is small in size.  Second Septal Branch  Vessel is small in size.  Third Septal Branch  Vessel is small in size.  Left Circumflex  Vessel was injected. Vessel is normal in caliber. Vessel is angiographically normal.  Second Obtuse Marginal Branch  Small to medium caliber  Right Coronary Artery  Vessel was injected. Vessel is normal in caliber. Vessel is angiographically normal.  Right Ventricular Branch  Vessel is small in size.  Right  Posterior Atrioventricular Artery  Vessel is moderate in size.  Intervention  No interventions have been documented.  Right Heart  Right Heart Pressures Hemodynamic findings consistent with mild pulmonary hypertension. PA P 52/20 mmHg-mean 32 mmHg.  PCWP 21 mmHg. V wave 31 mmHg LV EDP  is normal. LVP-EDP: 108/0 mmHg - 6 mmHg AoP-MAP: 110/60 mmHg - 80 mmHg Ao sat 97%, PA sat 67%. Cardiac Output-Index Kathlen [place]): 4.3-2.41  Right Atrium The right atrial size is normal. Right atrial pressure is normal. RAP mean 66mHg  Right Ventricle The right ventricle is mildly dilated. RVP-EDP: 53/0 mmHg - 3 mmHg  Wall Motion  Resting    No left ventriculogram performed.    Left Heart  Left Ventricle LV end diastolic pressure is low.  Aortic Valve There is no aortic valve stenosis. No calcification found in the aortic valve.  Coronary Diagrams  Diagnostic  Dominance: Right  Intervention  Implants     No implant documentation for this case.  Syngo Images  Link to Procedure Log   Show images for CARDIAC CATHETERIZATION Procedure Log  Images on Long Term Storage    Show images for MAuriana, Scalia  Hemo Data   Most Recent Value  Fick Cardiac Output 4.13 L/min  Fick Cardiac Output Index 2.41 (L/min)/BSA  RA A Wave -99 mmHg  RA V Wave 5 mmHg  RA Mean 3 mmHg  RV Systolic Pressure 53 mmHg  RV Diastolic Pressure -3 mmHg  RV EDP 3 mmHg  PA Systolic Pressure 54 mmHg  PA Diastolic Pressure 18 mmHg  PA Mean 33 mmHg  PW A Wave -99 mmHg  PW V Wave 33 mmHg  PW Mean 21 mmHg  AO Systolic Pressure 98 mmHg  AO Diastolic Pressure 58 mmHg  AO Mean 74 mmHg  LV Systolic Pressure 1774mmHg  LV Diastolic Pressure 0 mmHg  LV EDP 6 mmHg  AOp Systolic Pressure 1128mmHg  AOp Diastolic Pressure 60 mmHg  AOp Mean Pressure 80 mmHg  LVp Systolic Pressure 1786mmHg  LVp Diastolic Pressure 0 mmHg  LVp EDP Pressure 5 mmHg  QP/QS 1  TPVR Index 13.24 HRUI  TSVR Index 31.44 HRUI  PVR SVR Ratio 0.15  TPVR/TSVR Ratio  0.42    CT ANGIOGRAPHY CHEST, ABDOMEN AND PELVIS  TECHNIQUE: Multidetector CT imaging through the chest, abdomen and pelvis was performed using the standard protocol during bolus administration of intravenous contrast. Multiplanar reconstructed images and MIPs were obtained and reviewed to evaluate the vascular anatomy.  CONTRAST: 762mISOVUE-370 IOPAMIDOL (ISOVUE-370) INJECTION 76%  COMPARISON: 01/04/2016  FINDINGS: CTA CHEST FINDINGS  Cardiovascular: Normal caliber of the thoracic aorta without dissection. The aortic root at the sinuses of Valsalva measures 3.6 cm. Sinotubular junction measures 3.1 cm. Mid ascending thoracic aorta measures roughly 3.0 cm. Typical three-vessel arch anatomy. The great vessels are patent. Proximal bilateral vertebral arteries are patent. Mild atherosclerotic disease in the thoracic aorta. Small amount of calcified plaque at the origin of the left subclavian artery without significant stenosis. Proximal descending thoracic aorta measures 2.7 cm. Coronary artery calcifications involving the LAD, left circumflex and the right coronary artery. Mitral annular calcifications. Main pulmonary artery is enlarged measuring 3.9 cm and stable. Main pulmonary arteries are patent.  Mediastinum/Nodes: Subcarinal tissue measuring 1.6 cm in the short axis and previously measured 2.1 cm. Small upper mediastinal lymph nodes were poorly characterized on the previous examination due to contrast artifact. Largest lymph node in the upper mediastinum measures 1.0 cm in the short axis. Small supraclavicular lymph nodes bilaterally. Soft tissue in the prevascular region of the mediastinum measures 1.0 cm in the short axis previously measured 0.9 cm. No significant hilar lymphadenopathy.  Lungs/Pleura: Trachea and mainstem bronchi are patent. Pleural-based nodule in the right middle lobe on sequence 20,  image 100 measures 9 x 7 mm and previously measured 8 x 7  mm. This nodule has not significantly changed. Focal thickening in the mid right lung on image 63 was probably present on the previous examination. On the prior examination, there was extensive airspace disease in both lungs, particularly on the right side which has resolved. Persistent focal opacity in the anterior right lower lobe on sequence 20, image 76. Patchy pleural-based densities in the bilateral lower lobes along the dependent aspect. Poorly defined opacity along the medial left upper lobe on sequence 21, image 60, measures up to 1.0 cm, and appears to be stable since 2017. Mild scarring at the lung apices. Mild posterior right pleural thickening. No significant pleural effusions.  Musculoskeletal: No acute bone abnormality.  Review of the MIP images confirms the above findings.  CTA ABDOMEN AND PELVIS FINDINGS  VASCULAR  Aorta: Mild atherosclerotic disease in the abdominal aorta without aneurysm or dissection.  Celiac: Patent without evidence of aneurysm, dissection, vasculitis or significant stenosis.  SMA: Patent without evidence of aneurysm, dissection, vasculitis or significant stenosis.  Renals: Both renal arteries are patent without evidence of aneurysm, dissection, vasculitis, fibromuscular dysplasia or significant stenosis.  IMA: Patent without evidence of aneurysm, dissection, vasculitis or significant stenosis.  Inflow: Patent without evidence of aneurysm, dissection, vasculitis or significant stenosis.  Veins: No obvious venous abnormality within the limitations of this arterial phase study.  Review of the MIP images confirms the above findings.  NON-VASCULAR  Hepatobiliary: Normal appearance of the liver and gallbladder.  Pancreas: Unremarkable. No pancreatic ductal dilatation or surrounding inflammatory changes.  Spleen: Normal in size without focal abnormality.  Adrenals/Urinary Tract: Question some mild fullness in the  right adrenal gland. Normal left adrenal gland. Normal appearance of both kidneys. No suspicious renal lesions. No hydronephrosis. Urinary bladder is decompressed.  Stomach/Bowel: Stomach is within normal limits. No evidence of bowel wall thickening, distention, or inflammatory changes.  Lymphatic: No abdominopelvic lymphadenopathy.  Reproductive: Uterus and bilateral adnexa are unremarkable.  Other: Small umbilical hernia containing fat. Negative for free fluid. Negative for free air.  Musculoskeletal: No acute bone abnormality.  Review of the MIP images confirms the above findings.  IMPRESSION: 1. Negative for an aortic aneurysm or dissection. 2. Mild aortic atherosclerosis. 3. Coronary artery calcifications. 4. Stable enlargement of the main pulmonary artery and may represent underlying pulmonary hypertension. 5. Mild mediastinal lymphadenopathy but no significant change from the previous examination. 6. Stable poorly defined densities and nodules in the lungs. These nodular areas have not significantly changed since 2017 and likely represent benign entities. Markedly improved aeration in the lungs compared to the examination in 2017 but scattered parenchymal densities could represent postinflammatory changes. 7. No acute abnormality in the chest, abdomen or pelvis.   Electronically Signed By: Markus Daft M.D. On: 05/05/2019 17:02   Impression:  Patient has longstanding rheumatic heart disease with severe mitral stenosis and severe mitral regurgitation. She also has longstanding history of paroxysmal atrial fibrillation whichmore recently has remained persistent. She describes progressive symptoms of exertional shortness of breath and fatigue consistent with chronic diastolic congestive heart failure, [place] Heart Association functional class IIb.  I have personally reviewed the patient's most recent transthoracic and transesophageal echocardiograms  as well as her diagnostic cardiac catheterization. Echocardiograms demonstrate classical rheumatic mitral valve disease with severe thickening and calcification of both the anterior and posterior leaflets. There is severe thickening, effusion, and foreshortening of the subvalvular apparatus. There is severe mitral stenosis and at least  moderate if not severe mitral regurgitation. There is severe left atrial enlargement. Left ventricular size and function is normal. The aortic valve appears normal. There is mild to moderate pulmonary hypertension. There is mild right ventricular chamber enlargement with moderate tricuspid regurgitation. Functional anatomy of the tricuspid valve is not well elucidated and could be either type I or type IIIa dysfunction.  I agree the patient would best be treated with mitral valve replacement. She may need concomitant tricuspid valve repair. She might benefit from concomitant Maze procedure. She appears to be reasonably good candidate for minimally invasive approach for surgery.  CT angiography reveals no contraindication to peripheral cannulation for surgery.    Plan:  The patientwas againcounseled at length regarding the indications, risks and potential benefits of mitral valve replacement. The rationale for elective surgery has been explained, including a comparison between surgery and continued medical therapy with close follow-up. The possibility of need for concomitant tricuspid valve repair was discussed.The relative risks and benefits of performing a maze procedure at the time ofher surgery was discussed at length, including the expected likelihood of long term freedom from recurrent symptomatic atrial fibrillation and/or atrial flutter.Alternative surgical approaches have been discussed including a comparison between conventional sternotomy and minimally-invasive techniques. The relative risks and benefits of each have been reviewed as they  pertain to the patient's specific circumstances, and expectations for the patient's postoperative convalescence has been discussed. Expectations for the patient's postoperative convalescence have been discussed.Finally, we discussed the possibility of replacing the mitral valve using a mechanical prosthesis with the attendant need for long-term anticoagulation versus the alternative of replacing it using a bioprosthetic tissue valve with its potential for late structural valve deterioration and failure, depending upon the patient's longevity. The patient specifically requests that if the mitral valve must be replaced that it be done using a mechanicalvalve.   The patient understands and accepts all potential risks of surgery including but not limited to risk of death, stroke or other neurologic complication, myocardial infarction, congestive heart failure, respiratory failure, renal failure, bleeding requiring transfusion and/or reexploration, arrhythmia, infection or other wound complications, pneumonia, pleural and/or pericardial effusion, pulmonary embolus, aortic dissection or other major vascular complication, or delayed complications related to valve repair or replacement including but not limited to structural valve deterioration and failure, thrombosis, embolization, endocarditis, or paravalvular leak.  Specific risks potentially related to the minimally-invasive approach were discussed at length, including but not limited to risk of conversion to full or partial sternotomy, aortic dissection or other major vascular complication, unilateral acute lung injury or pulmonary edema, phrenic nerve dysfunction or paralysis, rib fracture, chronic pain, lung hernia, or lymphocele. All of her questions have been answered.      Valentina Gu. Roxy Manns, MD 05/22/2019 1:41 PM

## 2019-05-24 ENCOUNTER — Inpatient Hospital Stay (HOSPITAL_COMMUNITY): Payer: BC Managed Care – PPO

## 2019-05-24 ENCOUNTER — Encounter (HOSPITAL_COMMUNITY): Payer: Self-pay | Admitting: *Deleted

## 2019-05-24 ENCOUNTER — Encounter (HOSPITAL_COMMUNITY)
Admission: RE | Disposition: A | Discharge status: Home / Self Care | Payer: Self-pay | Source: Home / Self Care | Attending: Thoracic Surgery (Cardiothoracic Vascular Surgery)

## 2019-05-24 ENCOUNTER — Inpatient Hospital Stay (HOSPITAL_COMMUNITY): Payer: BC Managed Care – PPO | Admitting: Certified Registered Nurse Anesthetist

## 2019-05-24 ENCOUNTER — Other Ambulatory Visit: Payer: Self-pay

## 2019-05-24 ENCOUNTER — Inpatient Hospital Stay (HOSPITAL_COMMUNITY): Payer: BC Managed Care – PPO | Admitting: Physician Assistant

## 2019-05-24 ENCOUNTER — Inpatient Hospital Stay (HOSPITAL_COMMUNITY)
Admission: RE | Admit: 2019-05-24 | Discharge: 2019-05-30 | DRG: 219 | Disposition: A | Discharge status: Home / Self Care | Payer: BC Managed Care – PPO | Attending: Thoracic Surgery (Cardiothoracic Vascular Surgery) | Admitting: Thoracic Surgery (Cardiothoracic Vascular Surgery)

## 2019-05-24 DIAGNOSIS — I099 Rheumatic heart disease, unspecified: Secondary | ICD-10-CM | POA: Diagnosis present

## 2019-05-24 DIAGNOSIS — I272 Pulmonary hypertension, unspecified: Secondary | ICD-10-CM | POA: Diagnosis present

## 2019-05-24 DIAGNOSIS — I071 Rheumatic tricuspid insufficiency: Secondary | ICD-10-CM | POA: Diagnosis present

## 2019-05-24 DIAGNOSIS — K219 Gastro-esophageal reflux disease without esophagitis: Secondary | ICD-10-CM | POA: Diagnosis not present

## 2019-05-24 DIAGNOSIS — Z823 Family history of stroke: Secondary | ICD-10-CM

## 2019-05-24 DIAGNOSIS — E785 Hyperlipidemia, unspecified: Secondary | ICD-10-CM | POA: Diagnosis present

## 2019-05-24 DIAGNOSIS — Z954 Presence of other heart-valve replacement: Secondary | ICD-10-CM

## 2019-05-24 DIAGNOSIS — Z8052 Family history of malignant neoplasm of bladder: Secondary | ICD-10-CM | POA: Diagnosis not present

## 2019-05-24 DIAGNOSIS — I051 Rheumatic mitral insufficiency: Secondary | ICD-10-CM

## 2019-05-24 DIAGNOSIS — E119 Type 2 diabetes mellitus without complications: Secondary | ICD-10-CM | POA: Diagnosis present

## 2019-05-24 DIAGNOSIS — I480 Paroxysmal atrial fibrillation: Secondary | ICD-10-CM

## 2019-05-24 DIAGNOSIS — Z9889 Other specified postprocedural states: Secondary | ICD-10-CM

## 2019-05-24 DIAGNOSIS — I4811 Longstanding persistent atrial fibrillation: Secondary | ICD-10-CM | POA: Diagnosis not present

## 2019-05-24 DIAGNOSIS — J9811 Atelectasis: Secondary | ICD-10-CM | POA: Diagnosis not present

## 2019-05-24 DIAGNOSIS — Z7901 Long term (current) use of anticoagulants: Secondary | ICD-10-CM

## 2019-05-24 DIAGNOSIS — Z452 Encounter for adjustment and management of vascular access device: Secondary | ICD-10-CM | POA: Diagnosis not present

## 2019-05-24 DIAGNOSIS — I340 Nonrheumatic mitral (valve) insufficiency: Secondary | ICD-10-CM | POA: Diagnosis not present

## 2019-05-24 DIAGNOSIS — R918 Other nonspecific abnormal finding of lung field: Secondary | ICD-10-CM | POA: Diagnosis not present

## 2019-05-24 DIAGNOSIS — I081 Rheumatic disorders of both mitral and tricuspid valves: Secondary | ICD-10-CM | POA: Diagnosis not present

## 2019-05-24 DIAGNOSIS — I050 Rheumatic mitral stenosis: Secondary | ICD-10-CM | POA: Diagnosis present

## 2019-05-24 DIAGNOSIS — Z833 Family history of diabetes mellitus: Secondary | ICD-10-CM | POA: Diagnosis not present

## 2019-05-24 DIAGNOSIS — I4891 Unspecified atrial fibrillation: Secondary | ICD-10-CM | POA: Diagnosis not present

## 2019-05-24 DIAGNOSIS — I052 Rheumatic mitral stenosis with insufficiency: Secondary | ICD-10-CM | POA: Diagnosis present

## 2019-05-24 DIAGNOSIS — I2510 Atherosclerotic heart disease of native coronary artery without angina pectoris: Secondary | ICD-10-CM | POA: Diagnosis not present

## 2019-05-24 DIAGNOSIS — Z4682 Encounter for fitting and adjustment of non-vascular catheter: Secondary | ICD-10-CM | POA: Diagnosis not present

## 2019-05-24 DIAGNOSIS — Z7984 Long term (current) use of oral hypoglycemic drugs: Secondary | ICD-10-CM | POA: Diagnosis not present

## 2019-05-24 DIAGNOSIS — Z978 Presence of other specified devices: Secondary | ICD-10-CM | POA: Diagnosis not present

## 2019-05-24 DIAGNOSIS — D62 Acute posthemorrhagic anemia: Secondary | ICD-10-CM | POA: Diagnosis not present

## 2019-05-24 DIAGNOSIS — Z48812 Encounter for surgical aftercare following surgery on the circulatory system: Secondary | ICD-10-CM | POA: Diagnosis not present

## 2019-05-24 DIAGNOSIS — Z8249 Family history of ischemic heart disease and other diseases of the circulatory system: Secondary | ICD-10-CM | POA: Diagnosis not present

## 2019-05-24 DIAGNOSIS — Z952 Presence of prosthetic heart valve: Secondary | ICD-10-CM

## 2019-05-24 DIAGNOSIS — J90 Pleural effusion, not elsewhere classified: Secondary | ICD-10-CM

## 2019-05-24 DIAGNOSIS — Z8679 Personal history of other diseases of the circulatory system: Secondary | ICD-10-CM

## 2019-05-24 DIAGNOSIS — I517 Cardiomegaly: Secondary | ICD-10-CM | POA: Diagnosis not present

## 2019-05-24 DIAGNOSIS — I5033 Acute on chronic diastolic (congestive) heart failure: Secondary | ICD-10-CM | POA: Diagnosis not present

## 2019-05-24 DIAGNOSIS — D6959 Other secondary thrombocytopenia: Secondary | ICD-10-CM | POA: Diagnosis not present

## 2019-05-24 DIAGNOSIS — J811 Chronic pulmonary edema: Secondary | ICD-10-CM | POA: Diagnosis not present

## 2019-05-24 HISTORY — DX: Personal history of other diseases of the circulatory system: Z86.79

## 2019-05-24 HISTORY — PX: TEE WITHOUT CARDIOVERSION: SHX5443

## 2019-05-24 HISTORY — DX: Other specified postprocedural states: Z98.890

## 2019-05-24 HISTORY — DX: Presence of other heart-valve replacement: Z95.4

## 2019-05-24 HISTORY — PX: MINIMALLY INVASIVE MAZE PROCEDURE: SHX6244

## 2019-05-24 HISTORY — PX: CLIPPING OF ATRIAL APPENDAGE: SHX5773

## 2019-05-24 HISTORY — PX: MITRAL VALVE REPLACEMENT: SHX147

## 2019-05-24 LAB — CBC
HCT: 34.2 % — ABNORMAL LOW (ref 36.0–46.0)
HCT: 33.3 % — ABNORMAL LOW (ref 36.0–46.0)
Hemoglobin: 11.2 g/dL — ABNORMAL LOW (ref 12.0–15.0)
Hemoglobin: 10.7 g/dL — ABNORMAL LOW (ref 12.0–15.0)
MCH: 29.4 pg (ref 26.0–34.0)
MCH: 29.3 pg (ref 26.0–34.0)
MCHC: 32.7 g/dL (ref 30.0–36.0)
MCHC: 32.1 g/dL (ref 30.0–36.0)
MCV: 89.8 fL (ref 80.0–100.0)
MCV: 91.2 fL (ref 80.0–100.0)
Platelets: 108 10*3/uL — ABNORMAL LOW (ref 150–400)
Platelets: 108 10*3/uL — ABNORMAL LOW (ref 150–400)
RBC: 3.81 MIL/uL — ABNORMAL LOW (ref 3.87–5.11)
RBC: 3.65 MIL/uL — ABNORMAL LOW (ref 3.87–5.11)
RDW: 13.3 % (ref 11.5–15.5)
RDW: 13.3 % (ref 11.5–15.5)
WBC: 13.9 10*3/uL — ABNORMAL HIGH (ref 4.0–10.5)
WBC: 14.2 10*3/uL — ABNORMAL HIGH (ref 4.0–10.5)
nRBC: 0 % (ref 0.0–0.2)
nRBC: 0 % (ref 0.0–0.2)

## 2019-05-24 LAB — POCT I-STAT 7, (LYTES, BLD GAS, ICA,H+H)
Acid-base deficit: 2 mmol/L (ref 0.0–2.0)
Acid-base deficit: 3 mmol/L — ABNORMAL HIGH (ref 0.0–2.0)
Bicarbonate: 24.2 mmol/L (ref 20.0–28.0)
Bicarbonate: 23.2 mmol/L (ref 20.0–28.0)
Calcium, Ion: 1.25 mmol/L (ref 1.15–1.40)
Calcium, Ion: 1.22 mmol/L (ref 1.15–1.40)
HCT: 34 % — ABNORMAL LOW (ref 36.0–46.0)
HCT: 27 % — ABNORMAL LOW (ref 36.0–46.0)
Hemoglobin: 11.6 g/dL — ABNORMAL LOW (ref 12.0–15.0)
Hemoglobin: 9.2 g/dL — ABNORMAL LOW (ref 12.0–15.0)
O2 Saturation: 93 %
O2 Saturation: 95 %
Patient temperature: 36
Patient temperature: 36.2
Potassium: 4.2 mmol/L (ref 3.5–5.1)
Potassium: 3.9 mmol/L (ref 3.5–5.1)
Sodium: 144 mmol/L (ref 135–145)
Sodium: 144 mmol/L (ref 135–145)
TCO2: 26 mmol/L (ref 22–32)
TCO2: 25 mmol/L (ref 22–32)
pCO2 arterial: 46.1 mmHg (ref 32.0–48.0)
pCO2 arterial: 47.4 mmHg (ref 32.0–48.0)
pH, Arterial: 7.323 — ABNORMAL LOW (ref 7.350–7.450)
pH, Arterial: 7.295 — ABNORMAL LOW (ref 7.350–7.450)
pO2, Arterial: 69 mmHg — ABNORMAL LOW (ref 83.0–108.0)
pO2, Arterial: 83 mmHg (ref 83.0–108.0)

## 2019-05-24 LAB — COMPREHENSIVE METABOLIC PANEL
ALT: 35 U/L (ref 0–44)
AST: 26 U/L (ref 15–41)
Albumin: 3.6 g/dL (ref 3.5–5.0)
Alkaline Phosphatase: 27 U/L — ABNORMAL LOW (ref 38–126)
Anion gap: 10 (ref 5–15)
BUN: 21 mg/dL (ref 8–23)
CO2: 23 mmol/L (ref 22–32)
Calcium: 9.2 mg/dL (ref 8.9–10.3)
Chloride: 106 mmol/L (ref 98–111)
Creatinine, Ser: 1.03 mg/dL — ABNORMAL HIGH (ref 0.44–1.00)
GFR calc Af Amer: >60 mL/min (ref >60)
GFR calc non Af Amer: 57 mL/min — ABNORMAL LOW (ref >60)
Glucose, Bld: 136 mg/dL — ABNORMAL HIGH (ref 70–99)
Potassium: 3.9 mmol/L (ref 3.5–5.1)
Sodium: 139 mmol/L (ref 135–145)
Total Bilirubin: 1 mg/dL (ref 0.3–1.2)
Total Protein: 6.9 g/dL (ref 6.5–8.1)

## 2019-05-24 LAB — GLUCOSE, CAPILLARY
Glucose-Capillary: 130 mg/dL — ABNORMAL HIGH (ref 70–99)
Glucose-Capillary: 124 mg/dL — ABNORMAL HIGH (ref 70–99)
Glucose-Capillary: 112 mg/dL — ABNORMAL HIGH (ref 70–99)
Glucose-Capillary: 107 mg/dL — ABNORMAL HIGH (ref 70–99)
Glucose-Capillary: 125 mg/dL — ABNORMAL HIGH (ref 70–99)
Glucose-Capillary: 126 mg/dL — ABNORMAL HIGH (ref 70–99)
Glucose-Capillary: 121 mg/dL — ABNORMAL HIGH (ref 70–99)
Glucose-Capillary: 125 mg/dL — ABNORMAL HIGH (ref 70–99)
Glucose-Capillary: 125 mg/dL — ABNORMAL HIGH (ref 70–99)
Glucose-Capillary: 129 mg/dL — ABNORMAL HIGH (ref 70–99)
Glucose-Capillary: 152 mg/dL — ABNORMAL HIGH (ref 70–99)

## 2019-05-24 LAB — APTT: aPTT: 41 seconds — ABNORMAL HIGH (ref 24–36)

## 2019-05-24 LAB — PROTIME-INR
INR: 1.5 — ABNORMAL HIGH (ref 0.8–1.2)
Prothrombin Time: 17.9 seconds — ABNORMAL HIGH (ref 11.4–15.2)

## 2019-05-24 LAB — BASIC METABOLIC PANEL
Anion gap: 7 (ref 5–15)
BUN: 14 mg/dL (ref 8–23)
CO2: 22 mmol/L (ref 22–32)
Calcium: 8 mg/dL — ABNORMAL LOW (ref 8.9–10.3)
Chloride: 111 mmol/L (ref 98–111)
Creatinine, Ser: 0.93 mg/dL (ref 0.44–1.00)
GFR calc Af Amer: >60 mL/min (ref >60)
GFR calc non Af Amer: >60 mL/min (ref >60)
Glucose, Bld: 139 mg/dL — ABNORMAL HIGH (ref 70–99)
Potassium: 4.9 mmol/L (ref 3.5–5.1)
Sodium: 140 mmol/L (ref 135–145)

## 2019-05-24 LAB — MAGNESIUM: Magnesium: 3.3 mg/dL — ABNORMAL HIGH (ref 1.7–2.4)

## 2019-05-24 LAB — HEMOGLOBIN AND HEMATOCRIT, BLOOD
HCT: 28.3 % — ABNORMAL LOW (ref 36.0–46.0)
Hemoglobin: 9.4 g/dL — ABNORMAL LOW (ref 12.0–15.0)

## 2019-05-24 LAB — ECHO INTRAOPERATIVE TEE

## 2019-05-24 LAB — PLATELET COUNT: Platelets: 107 10*3/uL — ABNORMAL LOW (ref 150–400)

## 2019-05-24 SURGERY — REPLACEMENT, MITRAL VALVE, MINIMALLY INVASIVE
Anesthesia: General | Site: Chest | Laterality: Right

## 2019-05-24 MED ORDER — ACETAMINOPHEN 160 MG/5ML PO SOLN: 1000.0000 mg | Freq: Four times a day (QID) | ORAL | Status: DC

## 2019-05-24 MED ORDER — ROCURONIUM BROMIDE 10 MG/ML (PF) SYRINGE
PREFILLED_SYRINGE | INTRAVENOUS | Status: AC
  Filled 2019-05-24: qty 20

## 2019-05-24 MED ORDER — LACTATED RINGERS IV SOLN: 500.0000 mL | Freq: Once | INTRAVENOUS | Status: DC | PRN

## 2019-05-24 MED ORDER — DEXMEDETOMIDINE HCL IN NACL 200 MCG/50ML IV SOLN: 0.0000 ug/kg/h | INTRAVENOUS | Status: DC

## 2019-05-24 MED ORDER — LACTATED RINGERS IV SOLN: INTRAVENOUS | Status: DC | PRN

## 2019-05-24 MED ORDER — PHENYLEPHRINE 40 MCG/ML (10ML) SYRINGE FOR IV PUSH (FOR BLOOD PRESSURE SUPPORT)
PREFILLED_SYRINGE | INTRAVENOUS | Status: AC
  Filled 2019-05-24: qty 10

## 2019-05-24 MED ORDER — VANCOMYCIN HCL 1000 MG IV SOLR
INTRAVENOUS | Status: DC | PRN
  Administered 2019-05-24: 09:00:00 1000 mL

## 2019-05-24 MED ORDER — MORPHINE SULFATE (PF) 2 MG/ML IV SOLN
1.0000 mg | INTRAVENOUS | Status: DC | PRN
  Administered 2019-05-24: 1 mg via INTRAVENOUS
  Filled 2019-05-24: qty 1

## 2019-05-24 MED ORDER — MAGNESIUM SULFATE 4 GM/100ML IV SOLN
4.0000 g | Freq: Once | INTRAVENOUS | Status: AC
  Administered 2019-05-24: 15:00:00 4 g via INTRAVENOUS
  Filled 2019-05-24: qty 100

## 2019-05-24 MED ORDER — ROCURONIUM BROMIDE 10 MG/ML (PF) SYRINGE
PREFILLED_SYRINGE | INTRAVENOUS | Status: DC | PRN
  Administered 2019-05-24: 10 mg via INTRAVENOUS
  Administered 2019-05-24: 50 mg via INTRAVENOUS
  Administered 2019-05-24: 40 mg via INTRAVENOUS
  Administered 2019-05-24 (×2): 50 mg via INTRAVENOUS

## 2019-05-24 MED ORDER — ONDANSETRON HCL 4 MG/2ML IJ SOLN
INTRAMUSCULAR | Status: AC
  Filled 2019-05-24: qty 2

## 2019-05-24 MED ORDER — LACTATED RINGERS IV SOLN
INTRAVENOUS | Status: DC | PRN
  Administered 2019-05-24: 07:00:00 via INTRAVENOUS

## 2019-05-24 MED ORDER — CHLORHEXIDINE GLUCONATE 0.12 % MT SOLN
15.0000 mL | OROMUCOSAL | Status: AC
  Administered 2019-05-24: 15 mL via OROMUCOSAL

## 2019-05-24 MED ORDER — PHENYLEPHRINE 40 MCG/ML (10ML) SYRINGE FOR IV PUSH (FOR BLOOD PRESSURE SUPPORT)
PREFILLED_SYRINGE | INTRAVENOUS | Status: AC
  Filled 2019-05-24: qty 20

## 2019-05-24 MED ORDER — FAMOTIDINE IN NACL 20-0.9 MG/50ML-% IV SOLN
20.0000 mg | Freq: Two times a day (BID) | INTRAVENOUS | Status: AC
  Administered 2019-05-24 (×2): 20 mg via INTRAVENOUS
  Filled 2019-05-24: qty 50

## 2019-05-24 MED ORDER — SODIUM CHLORIDE 0.9% FLUSH
3.0000 mL | Freq: Two times a day (BID) | INTRAVENOUS | Status: DC
  Administered 2019-05-25 – 2019-05-29 (×10): 3 mL via INTRAVENOUS

## 2019-05-24 MED ORDER — LACTATED RINGERS IV SOLN
INTRAVENOUS | Status: DC | PRN
  Administered 2019-05-24 (×2): via INTRAVENOUS

## 2019-05-24 MED ORDER — INSULIN REGULAR BOLUS VIA INFUSION
0.0000 [IU] | Freq: Three times a day (TID) | INTRAVENOUS | Status: DC
  Filled 2019-05-24: qty 10

## 2019-05-24 MED ORDER — SODIUM CHLORIDE 0.9 % IV SOLN: 250.0000 mL | INTRAVENOUS | Status: DC

## 2019-05-24 MED ORDER — BUPIVACAINE 0.5 % ON-Q PUMP SINGLE CATH 400 ML
400.0000 mL | INJECTION | Status: AC
  Administered 2019-05-24: 12:00:00 400 mL
  Filled 2019-05-24 (×2): qty 400

## 2019-05-24 MED ORDER — MIDAZOLAM HCL 5 MG/5ML IJ SOLN
INTRAMUSCULAR | Status: DC | PRN
  Administered 2019-05-24 (×3): 1 mg via INTRAVENOUS
  Administered 2019-05-24: 3 mg via INTRAVENOUS

## 2019-05-24 MED ORDER — FENTANYL CITRATE (PF) 250 MCG/5ML IJ SOLN
INTRAMUSCULAR | Status: AC
  Filled 2019-05-24: qty 25

## 2019-05-24 MED ORDER — INSULIN REGULAR(HUMAN) IN NACL 100-0.9 UT/100ML-% IV SOLN: INTRAVENOUS | Status: DC

## 2019-05-24 MED ORDER — SODIUM CHLORIDE 0.9% FLUSH: 3.0000 mL | INTRAVENOUS | Status: DC | PRN

## 2019-05-24 MED ORDER — SODIUM CHLORIDE 0.9 % IV SOLN
INTRAVENOUS | Status: DC
  Administered 2019-05-24: 14:00:00 1 mL via INTRAVENOUS

## 2019-05-24 MED ORDER — MILRINONE LACTATE IN DEXTROSE 20-5 MG/100ML-% IV SOLN
INTRAVENOUS | Status: DC | PRN
  Administered 2019-05-24: .3 ug/kg/min via INTRAVENOUS

## 2019-05-24 MED ORDER — ACETAMINOPHEN 500 MG PO TABS
1000.0000 mg | ORAL_TABLET | Freq: Four times a day (QID) | ORAL | Status: AC
  Administered 2019-05-24 – 2019-05-29 (×17): 1000 mg via ORAL
  Filled 2019-05-24 (×18): qty 2

## 2019-05-24 MED ORDER — HEPARIN SODIUM (PORCINE) 1000 UNIT/ML IJ SOLN
INTRAMUSCULAR | Status: AC
  Filled 2019-05-24: qty 1

## 2019-05-24 MED ORDER — BISACODYL 5 MG PO TBEC
10.0000 mg | DELAYED_RELEASE_TABLET | Freq: Every day | ORAL | Status: DC
  Administered 2019-05-25 – 2019-05-26 (×2): 10 mg via ORAL
  Filled 2019-05-24 (×2): qty 2

## 2019-05-24 MED ORDER — POTASSIUM CHLORIDE 10 MEQ/50ML IV SOLN
10.0000 meq | INTRAVENOUS | Status: AC
  Administered 2019-05-24 (×2): 10 meq via INTRAVENOUS

## 2019-05-24 MED ORDER — PROTAMINE SULFATE 10 MG/ML IV SOLN
INTRAVENOUS | Status: AC
  Filled 2019-05-24: qty 50

## 2019-05-24 MED ORDER — DOCUSATE SODIUM 100 MG PO CAPS
200.0000 mg | ORAL_CAPSULE | Freq: Every day | ORAL | Status: DC
  Administered 2019-05-25 – 2019-05-26 (×2): 200 mg via ORAL
  Filled 2019-05-24 (×2): qty 2

## 2019-05-24 MED ORDER — ACETAMINOPHEN 650 MG RE SUPP
650.0000 mg | Freq: Once | RECTAL | Status: AC
  Administered 2019-05-24: 15:00:00 650 mg via RECTAL

## 2019-05-24 MED ORDER — ASPIRIN EC 325 MG PO TBEC
325.0000 mg | DELAYED_RELEASE_TABLET | Freq: Every day | ORAL | Status: DC
  Administered 2019-05-25: 325 mg via ORAL
  Filled 2019-05-24: qty 1

## 2019-05-24 MED ORDER — BISACODYL 10 MG RE SUPP: 10.0000 mg | Freq: Every day | RECTAL | Status: DC

## 2019-05-24 MED ORDER — METOPROLOL TARTRATE 5 MG/5ML IV SOLN: 2.5000 mg | INTRAVENOUS | Status: DC | PRN

## 2019-05-24 MED ORDER — OXYCODONE HCL 5 MG PO TABS
5.0000 mg | ORAL_TABLET | ORAL | Status: DC | PRN
  Filled 2019-05-24: qty 2

## 2019-05-24 MED ORDER — ALBUMIN HUMAN 5 % IV SOLN
INTRAVENOUS | Status: DC | PRN
  Administered 2019-05-24 (×2): via INTRAVENOUS

## 2019-05-24 MED ORDER — LACTATED RINGERS IV SOLN: INTRAVENOUS | Status: DC

## 2019-05-24 MED ORDER — NITROGLYCERIN IN D5W 200-5 MCG/ML-% IV SOLN: 0.0000 ug/min | INTRAVENOUS | Status: DC

## 2019-05-24 MED ORDER — BUPIVACAINE HCL 0.5 % IJ SOLN
INTRAMUSCULAR | Status: DC | PRN
  Administered 2019-05-24: 10 mL

## 2019-05-24 MED ORDER — PROPOFOL 10 MG/ML IV BOLUS
INTRAVENOUS | Status: DC | PRN
  Administered 2019-05-24: 80 mg via INTRAVENOUS

## 2019-05-24 MED ORDER — FENTANYL CITRATE (PF) 250 MCG/5ML IJ SOLN
INTRAMUSCULAR | Status: DC | PRN
  Administered 2019-05-24: 200 ug via INTRAVENOUS
  Administered 2019-05-24 (×2): 50 ug via INTRAVENOUS
  Administered 2019-05-24: 100 ug via INTRAVENOUS
  Administered 2019-05-24 (×4): 50 ug via INTRAVENOUS

## 2019-05-24 MED ORDER — BUPIVACAINE HCL (PF) 0.5 % IJ SOLN
INTRAMUSCULAR | Status: AC
  Filled 2019-05-24: qty 10

## 2019-05-24 MED ORDER — CALCIUM CHLORIDE 10 % IV SOLN
INTRAVENOUS | Status: DC | PRN
  Administered 2019-05-24: 500 mg via INTRAVENOUS

## 2019-05-24 MED ORDER — SODIUM CHLORIDE 0.45 % IV SOLN
INTRAVENOUS | Status: DC | PRN
  Administered 2019-05-24: 14:00:00 via INTRAVENOUS

## 2019-05-24 MED ORDER — ONDANSETRON HCL 4 MG/2ML IJ SOLN
4.0000 mg | Freq: Four times a day (QID) | INTRAMUSCULAR | Status: DC | PRN
  Administered 2019-05-25: 4 mg via INTRAVENOUS
  Filled 2019-05-24: qty 2

## 2019-05-24 MED ORDER — CHLORHEXIDINE GLUCONATE 0.12 % MT SOLN
15.0000 mL | Freq: Once | OROMUCOSAL | Status: AC
  Administered 2019-05-24: 06:00:00 15 mL via OROMUCOSAL
  Filled 2019-05-24: qty 15

## 2019-05-24 MED ORDER — ALBUMIN HUMAN 5 % IV SOLN
250.0000 mL | INTRAVENOUS | Status: DC | PRN
  Administered 2019-05-24: 12.5 g via INTRAVENOUS

## 2019-05-24 MED ORDER — SODIUM CHLORIDE 0.9 % IV SOLN
1.5000 g | Freq: Two times a day (BID) | INTRAVENOUS | Status: AC
  Administered 2019-05-24 – 2019-05-26 (×4): 1.5 g via INTRAVENOUS
  Filled 2019-05-24 (×4): qty 1.5

## 2019-05-24 MED ORDER — HEPARIN SODIUM (PORCINE) 1000 UNIT/ML IJ SOLN
INTRAMUSCULAR | Status: DC | PRN
  Administered 2019-05-24: 22000 [IU] via INTRAVENOUS

## 2019-05-24 MED ORDER — ASPIRIN 81 MG PO CHEW: 324.0000 mg | CHEWABLE_TABLET | Freq: Every day | ORAL | Status: DC

## 2019-05-24 MED ORDER — MILRINONE LACTATE IN DEXTROSE 20-5 MG/100ML-% IV SOLN
0.0000 ug/kg/min | INTRAVENOUS | Status: DC
  Administered 2019-05-24: 21:00:00 0.304 ug/kg/min via INTRAVENOUS
  Filled 2019-05-24: qty 100

## 2019-05-24 MED ORDER — DEXAMETHASONE SODIUM PHOSPHATE 10 MG/ML IJ SOLN
INTRAMUSCULAR | Status: AC
  Filled 2019-05-24: qty 1

## 2019-05-24 MED ORDER — DEXAMETHASONE SODIUM PHOSPHATE 10 MG/ML IJ SOLN
INTRAMUSCULAR | Status: DC | PRN
  Administered 2019-05-24: 10 mg via INTRAVENOUS

## 2019-05-24 MED ORDER — ACETAMINOPHEN 160 MG/5ML PO SOLN: 650.0000 mg | Freq: Once | ORAL | Status: AC

## 2019-05-24 MED ORDER — PANTOPRAZOLE SODIUM 40 MG PO TBEC
40.0000 mg | DELAYED_RELEASE_TABLET | Freq: Every day | ORAL | Status: DC
  Administered 2019-05-26 – 2019-05-30 (×5): 40 mg via ORAL
  Filled 2019-05-24 (×5): qty 1

## 2019-05-24 MED ORDER — TRAMADOL HCL 50 MG PO TABS
50.0000 mg | ORAL_TABLET | ORAL | Status: DC | PRN
  Administered 2019-05-25 – 2019-05-30 (×2): 50 mg via ORAL
  Filled 2019-05-24 (×2): qty 1

## 2019-05-24 MED ORDER — SODIUM CHLORIDE 0.9 % IV SOLN
INTRAVENOUS | Status: DC
  Administered 2019-05-24: 15:00:00 10 mL/h via INTRAVENOUS
  Administered 2019-05-24: 22:00:00 via INTRAVENOUS

## 2019-05-24 MED ORDER — PROPOFOL 10 MG/ML IV BOLUS
INTRAVENOUS | Status: AC
  Filled 2019-05-24: qty 20

## 2019-05-24 MED ORDER — LACTATED RINGERS IV SOLN
INTRAVENOUS | Status: DC
  Administered 2019-05-25: via INTRAVENOUS

## 2019-05-24 MED ORDER — VANCOMYCIN HCL IN DEXTROSE 1-5 GM/200ML-% IV SOLN
1000.0000 mg | Freq: Once | INTRAVENOUS | Status: AC
  Administered 2019-05-24: 22:00:00 1000 mg via INTRAVENOUS

## 2019-05-24 MED ORDER — MIDAZOLAM HCL 2 MG/2ML IJ SOLN: 2.0000 mg | INTRAMUSCULAR | Status: DC | PRN

## 2019-05-24 MED ORDER — MIDAZOLAM HCL (PF) 10 MG/2ML IJ SOLN
INTRAMUSCULAR | Status: AC
  Filled 2019-05-24: qty 2

## 2019-05-24 MED ORDER — METOPROLOL TARTRATE 12.5 MG HALF TABLET
12.5000 mg | ORAL_TABLET | Freq: Once | ORAL | Status: AC
  Administered 2019-05-24: 12.5 mg via ORAL
  Filled 2019-05-24: qty 1

## 2019-05-24 MED ORDER — PROTAMINE SULFATE 10 MG/ML IV SOLN
INTRAVENOUS | Status: DC | PRN
  Administered 2019-05-24: 20 mg via INTRAVENOUS
  Administered 2019-05-24: 200 mg via INTRAVENOUS

## 2019-05-24 MED ORDER — CHLORHEXIDINE GLUCONATE 4 % EX LIQD: 30.0000 mL | CUTANEOUS | Status: DC

## 2019-05-24 MED ORDER — 0.9 % SODIUM CHLORIDE (POUR BTL) OPTIME
TOPICAL | Status: DC | PRN
  Administered 2019-05-24: 5000 mL

## 2019-05-24 MED ORDER — PHENYLEPHRINE HCL-NACL 20-0.9 MG/250ML-% IV SOLN: 0.0000 ug/min | INTRAVENOUS | Status: DC

## 2019-05-24 SURGICAL SUPPLY — 132 items
ADAPTER CARDIO PERF ANTE/RETRO (ADAPTER) ×4 IMPLANT
ARTICLIP LAA PROCLIP II 45 (Clip) ×4 IMPLANT
ATTRACTOMAT 16X20 MAGNETIC DRP (DRAPES) ×4 IMPLANT
BAG DECANTER FOR FLEXI CONT (MISCELLANEOUS) ×8 IMPLANT
BLADE SURG 11 STRL SS (BLADE) ×4 IMPLANT
CANISTER SUCT 3000ML PPV (MISCELLANEOUS) ×12 IMPLANT
CANNULA FEM VENOUS REMOTE 22FR (CANNULA) ×4 IMPLANT
CANNULA FEMORAL ART 14 SM (MISCELLANEOUS) ×8 IMPLANT
CANNULA GUNDRY RCSP 15FR (MISCELLANEOUS) ×4 IMPLANT
CANNULA OPTISITE PERFUSION 16F (CANNULA) IMPLANT
CANNULA OPTISITE PERFUSION 18F (CANNULA) ×4 IMPLANT
CANNULA SUMP PERICARDIAL (CANNULA) ×8 IMPLANT
CARDIOBLATE CARDIAC ABLATION (MISCELLANEOUS)
CATH CPB KIT OWEN (MISCELLANEOUS) IMPLANT
CATH KIT ON-Q SILVERSOAK 5IN (CATHETERS) ×4 IMPLANT
CATH ROBINSON RED A/P 18FR (CATHETERS) ×4 IMPLANT
CELLS DAT CNTRL 66122 CELL SVR (MISCELLANEOUS) IMPLANT
CLAMP OLL ABLATION (MISCELLANEOUS) ×4 IMPLANT
CONN ST 1/4X3/8  BEN (MISCELLANEOUS) ×4
CONN ST 1/4X3/8 BEN (MISCELLANEOUS) ×12 IMPLANT
CONNECTOR 1/2X3/8X1/2 3 WAY (MISCELLANEOUS) ×2
CONNECTOR 1/2X3/8X1/2 3WAY (MISCELLANEOUS) ×6 IMPLANT
CONT SPEC 4OZ CLIKSEAL STRL BL (MISCELLANEOUS) ×4 IMPLANT
COVER BACK TABLE 24X17X13 BIG (DRAPES) ×8 IMPLANT
COVER PROBE W GEL 5X96 (DRAPES) ×4 IMPLANT
COVER WAND RF STERILE (DRAPES) ×4 IMPLANT
DERMABOND ADVANCED (GAUZE/BANDAGES/DRESSINGS) ×2
DERMABOND ADVANCED .7 DNX12 (GAUZE/BANDAGES/DRESSINGS) ×6 IMPLANT
DEVICE ATRICLIP LAA PRCLPII 45 (Clip) ×3 IMPLANT
DEVICE CARDIOBLATE CARDIAC ABL (MISCELLANEOUS) IMPLANT
DEVICE SUT CK QUICK LOAD MINI (Prosthesis & Implant Heart) ×8 IMPLANT
DEVICE TROCAR PUNCTURE CLOSURE (ENDOMECHANICALS) ×8 IMPLANT
DRAIN CHANNEL 28F RND 3/8 FF (WOUND CARE) ×16 IMPLANT
DRAPE C-ARM 42X72 X-RAY (DRAPES) ×8 IMPLANT
DRAPE CV SPLIT W-CLR ANES SCRN (DRAPES) ×8 IMPLANT
DRAPE INCISE IOBAN 66X45 STRL (DRAPES) ×16 IMPLANT
DRAPE PERI GROIN 82X75IN TIB (DRAPES) ×8 IMPLANT
DRAPE SLUSH/WARMER DISC (DRAPES) ×8 IMPLANT
DRSG AQUACEL AG ADV 3.5X10 (GAUZE/BANDAGES/DRESSINGS) ×4 IMPLANT
DRSG COVADERM 4X8 (GAUZE/BANDAGES/DRESSINGS) IMPLANT
ELECT BLADE 6.5 EXT (BLADE) ×8 IMPLANT
ELECT REM PT RETURN 9FT ADLT (ELECTROSURGICAL) ×8
ELECTRODE REM PT RTRN 9FT ADLT (ELECTROSURGICAL) ×6 IMPLANT
FELT TEFLON 1X6 (MISCELLANEOUS) ×8 IMPLANT
FEMORAL VENOUS CANN RAP (CANNULA) IMPLANT
GAUZE SPONGE 4X4 12PLY STRL (GAUZE/BANDAGES/DRESSINGS) ×4 IMPLANT
GAUZE SPONGE 4X4 12PLY STRL LF (GAUZE/BANDAGES/DRESSINGS) ×4 IMPLANT
GLOVE BIO SURGEON STRL SZ 6.5 (GLOVE) ×16 IMPLANT
GLOVE BIO SURGEON STRL SZ7 (GLOVE) ×8 IMPLANT
GLOVE BIOGEL PI IND STRL 6.5 (GLOVE) ×6 IMPLANT
GLOVE BIOGEL PI INDICATOR 6.5 (GLOVE) ×2
GLOVE ORTHO TXT STRL SZ7.5 (GLOVE) ×12 IMPLANT
GOWN STRL REUS W/ TWL LRG LVL3 (GOWN DISPOSABLE) ×21 IMPLANT
GOWN STRL REUS W/TWL LRG LVL3 (GOWN DISPOSABLE) ×7
KIT BASIN OR (CUSTOM PROCEDURE TRAY) ×4 IMPLANT
KIT DEVICE SUT COR-KNOT MIS 5 (INSTRUMENTS) ×4 IMPLANT
KIT DILATOR VASC 18G NDL (KITS) ×4 IMPLANT
KIT DRAINAGE VACCUM ASSIST (KITS) ×4 IMPLANT
KIT SUCTION CATH 14FR (SUCTIONS) ×4 IMPLANT
KIT SUT CK MINI COMBO 4X17 (Prosthesis & Implant Heart) ×4 IMPLANT
KIT TURNOVER KIT B (KITS) ×4 IMPLANT
LEAD PACING MYOCARDI (MISCELLANEOUS) ×4 IMPLANT
LINE VENT (MISCELLANEOUS) ×4 IMPLANT
NEEDLE AORTIC ROOT 14G 7F (CATHETERS) ×8 IMPLANT
NS IRRIG 1000ML POUR BTL (IV SOLUTION) ×20 IMPLANT
PACK E MIN INVASIVE VALVE (SUTURE) ×4 IMPLANT
PACK OPEN HEART (CUSTOM PROCEDURE TRAY) ×4 IMPLANT
PAD ARMBOARD 7.5X6 YLW CONV (MISCELLANEOUS) ×8 IMPLANT
PAD ELECT DEFIB RADIOL ZOLL (MISCELLANEOUS) ×4 IMPLANT
POSITIONER HEAD DONUT 9IN (MISCELLANEOUS) ×4 IMPLANT
PROBE CRYO2-ABLATION MALLABLE (MISCELLANEOUS) ×4 IMPLANT
RTRCTR WOUND ALEXIS 18CM MED (MISCELLANEOUS)
SET CANNULATION TOURNIQUET (MISCELLANEOUS) ×8 IMPLANT
SET CARDIOPLEGIA MPS 5001102 (MISCELLANEOUS) ×4 IMPLANT
SET IRRIG TUBING LAPAROSCOPIC (IRRIGATION / IRRIGATOR) ×4 IMPLANT
SET MICROPUNCTURE 5F STIFF (MISCELLANEOUS) ×4 IMPLANT
SOLUTION ANTI FOG 6CC (MISCELLANEOUS) ×4 IMPLANT
SPONGE LAP 18X18 X RAY DECT (DISPOSABLE) ×4 IMPLANT
SPONGE LAP 4X18 RFD (DISPOSABLE) ×4 IMPLANT
SUT BONE WAX W31G (SUTURE) ×4 IMPLANT
SUT ETHIBON 2 0 V 52N 30 (SUTURE) ×4 IMPLANT
SUT ETHIBOND (SUTURE) IMPLANT
SUT ETHIBOND 2 0 SH (SUTURE) ×8 IMPLANT
SUT ETHIBOND 2 0 V4 (SUTURE) IMPLANT
SUT ETHIBOND 2 0V4 GREEN (SUTURE) IMPLANT
SUT ETHIBOND 2-0 RB-1 WHT (SUTURE) IMPLANT
SUT ETHIBOND 4 0 TF (SUTURE) IMPLANT
SUT ETHIBOND 5 0 C 1 30 (SUTURE) IMPLANT
SUT ETHIBOND NAB MH 2-0 36IN (SUTURE) ×4 IMPLANT
SUT ETHIBOND X763 2 0 SH 1 (SUTURE) ×4 IMPLANT
SUT GORETEX 6.0 TH-9 30 IN (SUTURE) IMPLANT
SUT GORETEX CV 4 TH 22 36 (SUTURE) IMPLANT
SUT GORETEX CV-5THC-13 36IN (SUTURE) IMPLANT
SUT GORETEX CV4 TH-18 (SUTURE) IMPLANT
SUT GORETEX TH-18 36 INCH (SUTURE) IMPLANT
SUT MNCRL AB 3-0 PS2 18 (SUTURE) ×4 IMPLANT
SUT PROLENE 3 0 SH DA (SUTURE) ×16 IMPLANT
SUT PROLENE 3 0 SH1 36 (SUTURE) ×32 IMPLANT
SUT PROLENE 4 0 RB 1 (SUTURE) ×4
SUT PROLENE 4-0 RB1 .5 CRCL 36 (SUTURE) ×12 IMPLANT
SUT PROLENE 5 0 C 1 36 (SUTURE) ×8 IMPLANT
SUT PROLENE 6 0 C 1 30 (SUTURE) ×8 IMPLANT
SUT SILK  1 MH (SUTURE) ×7
SUT SILK 1 MH (SUTURE) ×21 IMPLANT
SUT SILK 1 TIES 10X30 (SUTURE) ×4 IMPLANT
SUT SILK 2 0 SH CR/8 (SUTURE) ×4 IMPLANT
SUT SILK 2 0 TIES 10X30 (SUTURE) ×4 IMPLANT
SUT SILK 2 0SH CR/8 30 (SUTURE) ×8 IMPLANT
SUT SILK 3 0 (SUTURE) ×1
SUT SILK 3 0 SH CR/8 (SUTURE) ×4 IMPLANT
SUT SILK 3 0SH CR/8 30 (SUTURE) ×4 IMPLANT
SUT SILK 3-0 18XBRD TIE 12 (SUTURE) ×3 IMPLANT
SUT TEM PAC WIRE 2 0 SH (SUTURE) ×8 IMPLANT
SUT VIC AB 2-0 CTX 36 (SUTURE) ×8 IMPLANT
SUT VIC AB 2-0 UR6 27 (SUTURE) ×8 IMPLANT
SUT VIC AB 3-0 SH 8-18 (SUTURE) ×12 IMPLANT
SUT VICRYL 2 TP 1 (SUTURE) ×4 IMPLANT
SYR 10ML LL (SYRINGE) ×4 IMPLANT
SYSTEM SAHARA CHEST DRAIN ATS (WOUND CARE) ×4 IMPLANT
TAPE CLOTH SURG 4X10 WHT LF (GAUZE/BANDAGES/DRESSINGS) ×4 IMPLANT
TAPE PAPER 2X10 WHT MICROPORE (GAUZE/BANDAGES/DRESSINGS) ×4 IMPLANT
TOWEL GREEN STERILE (TOWEL DISPOSABLE) ×4 IMPLANT
TOWEL GREEN STERILE FF (TOWEL DISPOSABLE) ×4 IMPLANT
TRAY FOLEY SLVR 16FR TEMP STAT (SET/KITS/TRAYS/PACK) ×4 IMPLANT
TROCAR XCEL BLADELESS 5X75MML (TROCAR) ×4 IMPLANT
TROCAR XCEL NON-BLD 11X100MML (ENDOMECHANICALS) ×8 IMPLANT
TUBE SUCT INTRACARD DLP 20F (MISCELLANEOUS) ×4 IMPLANT
TUNNELER SHEATH ON-Q 11GX8 DSP (PAIN MANAGEMENT) ×4 IMPLANT
UNDERPAD 30X30 (UNDERPADS AND DIAPERS) ×4 IMPLANT
VALVE MITRAL 33MM (Prosthesis & Implant Heart) ×4 IMPLANT
WATER STERILE IRR 1000ML POUR (IV SOLUTION) ×8 IMPLANT
WIRE EMERALD 3MM-J .035X150CM (WIRE) ×8 IMPLANT

## 2019-05-24 NOTE — Anesthesia Procedure Notes (Signed)
Procedure Name: Intubation Date/Time: 05/24/2019 7:54 AM Performed by: Clearnce Sorrel, CRNA Pre-anesthesia Checklist: Patient identified, Emergency Drugs available, Suction available, Patient being monitored and Timeout performed Patient Re-evaluated:Patient Re-evaluated prior to induction Oxygen Delivery Method: Circle system utilized Preoxygenation: Pre-oxygenation with 100% oxygen Induction Type: IV induction Ventilation: Mask ventilation without difficulty and Oral airway inserted - appropriate to patient size Laryngoscope Size: Mac and 3 Grade View: Grade I Endobronchial tube: Left and Double lumen EBT and 35 Fr Number of attempts: 1 Airway Equipment and Method: Stylet Placement Confirmation: ETT inserted through vocal cords under direct vision,  positive ETCO2 and breath sounds checked- equal and bilateral Tube secured with: Tape Dental Injury: Teeth and Oropharynx as per pre-operative assessment

## 2019-05-24 NOTE — Transfer of Care (Signed)
Immediate Anesthesia Transfer of Care Note  Patient: Melody Torres  Procedure(s) Performed: MINIMALLY INVASIVE MITRAL VALVE (MV) REPLACEMENT Using Carbomedics Optiform Heart Valve Size 30mm (Right Chest) MINIMALLY INVASIVE MAZE PROCEDURE (N/A ) TRANSESOPHAGEAL ECHOCARDIOGRAM (TEE) (N/A ) Clipping Of Atrial Appendage Using AtriCure PRO2 clip size 22mm (N/A Chest)  Patient Location: SICU  Anesthesia Type:General  Level of Consciousness: Patient remains intubated per anesthesia plan  Airway & Oxygen Therapy: Patient remains intubated per anesthesia plan  Post-op Assessment: Report given to RN and Post -op Vital signs reviewed and stable  Post vital signs: Reviewed and stable  Last Vitals:  Vitals Value Taken Time  BP    Temp 36 C 05/24/19 1358  Pulse 82 05/24/19 1358  Resp 17 05/24/19 1358  SpO2 89 % 05/24/19 1358  Vitals shown include unvalidated device data.  Last Pain:  Vitals:   05/24/19 0609  TempSrc: Oral  PainSc:          Complications: No apparent anesthesia complications

## 2019-05-24 NOTE — Anesthesia Procedure Notes (Signed)
Arterial Line Insertion Start/End7/29/2020 7:00 AM, 05/24/2019 7:00 AM Performed by: Clearnce Sorrel, CRNA, CRNA  Patient location: Pre-op. Preanesthetic checklist: patient identified, IV checked, site marked, risks and benefits discussed, surgical consent, monitors and equipment checked, pre-op evaluation, timeout performed and anesthesia consent Lidocaine 1% used for infiltration Left, radial was placed Catheter size: 20 Fr Hand hygiene performed  and maximum sterile barriers used   Attempts: 1 Procedure performed using ultrasound guided technique. Following insertion, dressing applied. Post procedure assessment: normal and unchanged

## 2019-05-24 NOTE — Op Note (Signed)
CARDIOTHORACIC SURGERY OPERATIVE NOTE  Date of Procedure:   05/24/2019  Preoperative Diagnosis:    Rheumatic heart disease  Severe mitral stenosis and moderate mitral regurgitation  Long-standing persistent atrial fibrillation  Postoperative Diagnosis: Same  Procedure:    Minimally-Invasive Mitral Valve Replacement  Sorin Carbomedics Optiform bileaflet mechanical valve (size 33mm, cat #F7-033, serial #[id number])   Minimally-Invasive Maze Procedure  Complete bilateral atrial lesion set using cryothermy and bipolar radiofrequency ablation  Clipping of Left Atrial Appendage (Atricure Pro245 left atrial clip, size 45 mm)  Surgeon: Salvatore Decentlarence H. Cornelius Moraswen, MD  Assistant: Jillyn HiddenMyron Roddenberry, PA-C  Anesthesia: Kipp Broodavid Joslin, MD  Operative Findings:  Rheumatic mitral valve disease  Severe mitral stenosis and moderate mitral regurgitation  Normal left ventricular systolic function  Moderate-severe pulmonary hypertension  Normal right ventricular function  Trace tricuspid regurgitation              BRIEF CLINICAL NOTE AND INDICATIONS FOR SURGERY  Patient is a 63 year old female referred for surgical consultation to discuss treatment options for rheumatic heart disease with combination of severe mitral stenosis, mitral regurgitation, chronic diastolic congestive heart failure, andatrial fibrillation.  Patient states that she became ill at age 63 and has a heart murmur ever since. She was later diagnosed with rheumatic heart disease. She has been followed intermittently for many years.In 2008 she first developed paroxysmal atrial fibrillation. She was found to have mitral stenosis and mitral regurgitation. She has been chronically anticoagulated using warfarin ever since. For the last several years she has been followed by Dr. Bing MatterKrasowski.Echocardiograms have documented the presence of normal left ventricular systolic function with severe mitral stenosis and mitral  regurgitation. Over the past 6 to 12 months the patient has developed decreased exercise tolerance with increasing exertional shortness of breath and fatigue. Transthoracic echocardiogram performed September 14, 2018 revealed normal left ventricular systolic function with severe rheumatic mitral valve disease including severe mitral stenosis and moderate mitral regurgitation. There was severe left atrial enlargement and evidence of pulmonary hypertension. There was mild tricuspid regurgitation. The patient was [facility] mitral valvuloplasty. Transesophageal echocardiogram was recommended and performed on January 06, 2019. This confirmed the presence of severe rheumatic mitral valve disease with severe thickening and calcification of the mitral valve leaflets.There was severe mitral stenosis and severe mitral regurgitation. There was moderate tricuspid regurgitation. Left ventricular systolic function remain normal. The aortic valve was normal. The patient's mitral valve was felt not suitable for balloon mitral valvuloplasty. Referral for possible mitral valve replacement was delayed because of the COVID-19 pandemic. Eventually the patient underwent left and right heart catheterization on April 07, 2019. The patient was found to have mild nonobstructive coronary artery disease. There was mild to moderate pulmonary hypertension and normal left ventricular end-diastolic pressure. Cardiothoracic surgical consultation was requested.  The patient has been seen in consultation and counseled at length regarding the indications, risks and potential benefits of surgery.  All questions have been answered, and the patient provides full informed consent for the operation as described.    DETAILS OF THE OPERATIVE PROCEDURE  Preparation:  The patient is brought to the operating room on the above mentioned date and central monitoring was  established by the anesthesia team including placement of Swan-Ganz catheter through the left internal jugular vein.  A radial arterial line is placed. The patient is placed in the supine position on the operating table.  Intravenous antibiotics are administered. General endotracheal anesthesia is induced uneventfully. The  patient is initially intubated using a dual lumen endotracheal tube.  A Foley catheter is placed.  Baseline transesophageal echocardiogram was performed.  Findings were notable for classical rheumatic mitral valve disease with severe mitral stenosis and moderate to severe mitral regurgitation.  There was severe thickening and severely restricted leaflet mobility involving both the anterior and posterior leaflets of the mitral valve.  There was severe left atrial enlargement.  There was normal left ventricular size and systolic function.  There was normal right ventricular size and systolic function.  There was trivial tricuspid regurgitation.  The tricuspid annulus measured 3.4 to 3.5 cm.  No other significant abnormalities were noted.  A soft roll is placed behind the patient's left scapula and the neck gently extended and turned to the left.   The patient's right neck, chest, abdomen, both groins, and both lower extremities are prepared and draped in a sterile manner. A time out procedure is performed.   Percutaneous Vascular Access:  Percutaneous arterial and venous access were obtained on the right side.  Using ultrasound guidance the right common femoral vein was cannulated using the Seldinger technique and a pair of Perclose vascular closure devises were placed, after which time an 8 French sheath inserted.  The right common femoral artery was cannulated using a micropuncture wire and sheath.  A pair of Perclose vascular closure devices were placed at opposing 30 degree angles in the femoral artery, and a 8 French sheath inserted.  The right internal jugular vein was cannulated   using ultrasound guidance and an 8 French sheath inserted.     Surgical Approach:  A right miniature anterolateral thoracotomy incision is performed. The incision is placed just lateral to and superior to the right nipple. The pectoralis major muscle is retracted medially and completely preserved. The right pleural space is entered through the 3rd intercostal space. A soft tissue retractor is placed.  Two 11 mm ports are placed through separate stab incisions inferiorly. The right pleural space is insufflated continuously with carbon dioxide gas through the posterior port during the remainder of the operation.  A pledgeted sutures placed through the dome of the right hemidiaphragm and retracted inferiorly to facilitate exposure.  A longitudinal incision is made in the pericardium 3 cm anterior to the phrenic nerve and silk traction sutures are placed on either side of the incision for exposure.   Extracorporeal Cardiopulmonary Bypass and Myocardial Protection:   The patient was heparinized systemically.  The right common femoral vein is cannulated through the venous sheath and a guidewire advanced into the right atrium using TEE guidance.  The femoral vein cannulated using a 22 Fr long femoral venous cannula.  The right common femoral artery is cannulated through the arterial sheath and a guidewire advanced into the descending thoracic aorta using TEE guidance.  Femoral artery is cannulated with a 18 French femoral arterial cannula.  The right internal jugular vein is cannulated through the venous sheath and a guidewire advanced into the right atrium.  The internal jugular vein is cannulated using a 14 French pediatric femoral venous cannula.   Adequate heparinization is verified.   The entire pre-bypass portion of the operation was notable for stable hemodynamics.  Cardiopulmonary bypass was begun.  Vacuum assist venous drainage is utilized. The incision in the pericardium is extended in both  directions. Venous drainage and exposure are notably excellent.    Clipping of Left Atrial Appendage:  The left atrial appendage is obliterated using an Atricure left atrial appendage clip (Atriclip  Pro245, size 45 mm).  The clip is applied under thoracoscopic visualization posterior to the aorta and pulmonary artery through the oblique sinus.  The clip was applied prior to application of the aortic crossclamp, with transesophageal echocardiographic confirmation that the clip satisfactorily obliterates the appendage.   Myocardial Protection:  A retrograde cardioplegia cannula is placed through the right atrium into the coronary sinus using transesophageal echocardiogram guidance.  An antegrade cardioplegia cannula is placed in the ascending aorta.  The patient is cooled to 32C systemic temperature.  The aortic cross clamp is applied and cardioplegia is delivered initially in an antegrade fashion through the aortic root using modified [place] cold blood cardioplegia (Kennestone blood cardioplegia protocol).   The initial cardioplegic arrest is rapid with early diastolic arrest. Supplemental cardioplegia is administered through the coronary sinus catheter.  Myocardial protection was felt to be excellent.   Maze Procedure (left atrial lesion set):  Following placement of the aortic crossclamp and the administration of the initial arresting dose of cardioplegia, a left atriotomy incision was performed through the interatrial groove and extended partially across the back wall of the left atrium after opening the oblique sinus inferiorly.  The mitral valve and floor of the left atrium are exposed using a self-retaining retractor.    The Atricure CryoICE nitrous oxide cryothermy system is utilized for all cryothermy ablation lesions.  The left atrial lesion set of the Cox cryomaze procedure is performed using 3 minute duration for all cryothermy lesions.  Initially a lesion is placed along the  endocardial surface of the left atrium from the caudad apex of the atriotomy incision across the posterior wall of the left atrium onto the posterior mitral annulus.  A mirror image lesion along the epicardial surface is then performed with the probe posterior to the left atrium, crossing over the coronary sinus and extending up the epicardial surface of the posterolateral right atrium.  Two lesions are then performed to create a box isolating all of the pulmonary veins from the remainder of the left atrium.  The first lesion is placed from the cephalad apex of the atriotomy incision across the dome of the left atrium to just anterior to the left sided pulmonary veins.  The second lesion completes the box from the caudad apex of the atriotomy incision across the back wall of the left atrium to connect with the previous lesion just anterior to the left sided pulmonary veins.     Mitral Valve Replacement:  The mitral valve was inspected and notable for classical rheumatic mitral stenosis.  The valve was severely deformed, thickened, and calcified with a fishmouth appearance.  Subvalvular apparatus to both the anterior and posterior leaflets were preserved. The majority of the anterior leaflet of mitral valve is excised sharply. Portions of the free margin of the anterior leaflet are preserved with their associated chordae tendinae, split in the midline, and incorporated into the valve replacement suture line laterally.  The posterior leaflet was split in the midline.  The valve was sized to accept a 33 mm mechanical prosthetic valve.  Mitral valve replacement is performed using interrupted 2-0 Ethibond horizontal mattress pledgeted sutures with pledgets in the supra-annular position.  A Sorin Carbomedics Optiform bileaflet mechanical valve (size 33mm, cat [id number], serial [id number]) is implanted uneventfully.  Care is taken to make sure that both leaflets move normally without any sign of impingement in the  subvalvular apparatus.  All sutures were secured using a Cor-knot device.  The valve is tested with saline  and appears to be functioning normally. There is no paravalvular leak. Rewarming is begun.  The atriotomy was closed using a 2-layer closure of running 3-0 Prolene suture after placing a sump drain across the mitral valve to serve as a left ventricular vent.  One final dose of warm retrograde "reanimation dose" cardioplegia was administered retrograde through the coronary sinus catheter while all air was evacuated through the aortic root.  The aortic cross clamp was removed after a total cross clamp time of 97 minutes.   Maze Procedure (right atrial lesion set):  The retrograde cardioplegia cannula was removed and the small hole in the right atrium extended a short distance.  The AtriCure Synergy bipolar radiofrequency ablation clamp is utilized to create a series of linear lesions in the right atrium, each with one limb of the clamp along the endocardial surface and the other along the epicardial surface. The first lesion is placed from the posterior apex of the atriotomy incision and along the lateral wall of the right atrium to reach the lateral aspect of the superior vena cava, connecting with the cryothermy lesion placed previously from the coronary sinus to the epicardial surface of the posterolateral right atrium. A second lesion is placed in the opposite direction from the posterior apex of the atriotomy incision along the lateral wall to reach the lateral aspect of the inferior vena cava. A third lesion is placed from the midportion of the atriotomy incision extending at a right angle to reach the tip of the right atrial appendage. A fourth lesion is placed from the anterior apex of the atriotomy incision in an anterior and inferior direction to reach the acute margin of the heart. Finally, the cryotherapy probe is utilized to complete the right atrial lesion set by placing the probe along the  endocardial surface of the right atrium from the anterior apex of the atriotomy incision to reach the tricuspid annulus at the 2:00 position. The atriotomy incision is closed with a 2 layer closure of running 4-0 Prolene suture.   Procedure Completion:  Epicardial pacing wires are fixed to the inferior wall of the right ventricle and to the right atrial appendage. The patient is rewarmed to 37C temperature. The left ventricular vent and antegrade cardioplegia cannula are removed. The patient is weaned and disconnected from cardiopulmonary bypass.  The patient's rhythm at separation from bypass was AV paced.  The patient was weaned from bypass on low dose milrinone infusion. Total cardiopulmonary bypass time for the operation was 153 minutes.  Followup transesophageal echocardiogram performed after separation from bypass revealed a normal functioning bileaflet mechanical prosthetic valve in the mitral position. There was no paravalvular leak.  Left ventricular function was unchanged from preoperatively.  The femoral arterial and venous cannulas were removed and all Perclose sutures secured.  Manual pressure was maintained while Protamine was administered.  The right internal jugular cannula was removed and manual pressure held on the neck and groin for 15 minutes.  Single lung ventilation was begun. The atriotomy closure was inspected for hemostasis. The pericardial sac was drained using a 28 French Bard drain placed through the anterior port incision.  The right pleural space is irrigated with saline solution and inspected for hemostasis.   A single lumen On-Q catheter is placed for postoperative analgesia.  The catheter is initially tunneled through the subcutaneous tissues to the posterior port incision and then passed through the chest wall and tunneled into the subpleural space to cover the 2nd through the 6th intercostal nerve  roots posteriorly.  The catheter is flushed with 0.5% bupivacaine  solution and ultimately connected to a continuous infusion pump.  The right pleural space was drained using a 28 French Bard drain placed through the posterior port incision. The miniature thoracotomy incision was closed in multiple layers in routine fashion. The right groin incision was inspected for hemostasis and closed in multiple layers in routine fashion.  The post-bypass portion of the operation was notable for stable rhythm and hemodynamics.  No blood products were administered during the operation.   Disposition:  The patient tolerated the procedure well.  The patient was reintubated using a single lumen endotracheal tube and subsequently transported to the surgical intensive care unit in stable condition. There were no intraoperative complications. All sponge instrument and needle counts are verified correct at completion of the operation.    Salvatore Decentlarence H. Cornelius Moraswen MD 05/24/2019 1:38 PM

## 2019-05-24 NOTE — Procedures (Signed)
Extubation Procedure Note  Patient Details:   Name: Melody Torres DOB: [birth year] MRN: [id number]   Airway Documentation:    Vent end date: 05/24/19 Vent end time: 1856   Evaluation  O2 sats: stable throughout Complications: No apparent complications Patient did tolerate procedure well. Bilateral Breath Sounds: Clear, Diminished   Yes   Patient extubated per rapid wean protocol. RN at bedside. Vitals are stable. NIF -18, VC 1.6L. MD made aware. Positive cuff leak noted. No stridor.  Herbie Baltimore 05/24/2019, 7:00 PM

## 2019-05-24 NOTE — Anesthesia Procedure Notes (Signed)
Central Venous Catheter Insertion Performed by: Roberts Gaudy, MD, anesthesiologist Start/End7/29/2020 6:50 AM, 05/24/2019 7:00 AM Patient location: Pre-op. Preanesthetic checklist: patient identified, IV checked, site marked, risks and benefits discussed, surgical consent, monitors and equipment checked, pre-op evaluation, timeout performed and anesthesia consent Lidocaine 1% used for infiltration and patient sedated Hand hygiene performed  and maximum sterile barriers used  Catheter size: 8.5 Fr Sheath introducer Procedure performed using ultrasound guided technique. Ultrasound Notes:anatomy identified, needle tip was noted to be adjacent to the nerve/plexus identified, no ultrasound evidence of intravascular and/or intraneural injection and image(s) printed for medical record Attempts: 1 Following insertion, line sutured and dressing applied. Post procedure assessment: blood return through all ports, free fluid flow and no air  Patient tolerated the procedure well with no immediate complications.

## 2019-05-24 NOTE — Progress Notes (Signed)
RT NOTE: RT completed recruitment per anesthesiologist request due to sats in 80's. Patient tolerated well and is now sating 100% on 60% FiO2. RT will continue to monitor.

## 2019-05-24 NOTE — Brief Op Note (Addendum)
05/24/2019  11:51 AM  PATIENT:  Melody Torres  63 y.o. female  PRE-OPERATIVE DIAGNOSIS:  Mitral Stenosis Mitral Regurgitation AFIB  POST-OPERATIVE DIAGNOSIS:  Mitral Stenosis, Mitral Regurg Afibrilation  PROCEDURE:  Procedure(s): MINIMALLY INVASIVE MITRAL VALVE (MV) REPLACEMENT Using Carbomedics Optiform Heart Valve Size 70mm (Right) MINIMALLY INVASIVE MAZE PROCEDURE (N/A) TRANSESOPHAGEAL ECHOCARDIOGRAM (TEE) (N/A) Clipping Of Atrial Appendage Using AtriCure PRO2 clip size 52mm (N/A)  SURGEON:  Rexene Alberts, MD - Primary  PHYSICIAN ASSISTANT: Roddenberry   ANESTHESIA:   general  EBL: 400 mL  BLOOD ADMINISTERED:none  DRAINS: Rigth pleural and mediastinal tubes   LOCAL MEDICATIONS USED:  NONE  SPECIMEN:  Source of Specimen:  Mitral valve leaflet  DISPOSITION OF SPECIMEN:  PATHOLOGY  COUNTS:  YES  DICTATION: .Dragon Dictation  PLAN OF CARE: Admit to inpatient   PATIENT DISPOSITION:  ICU - intubated and hemodynamically stable.   Delay start of Pharmacological VTE agent (>24hrs) due to surgical blood loss or risk of bleeding: yes

## 2019-05-24 NOTE — Progress Notes (Signed)
EVENING ROUNDS NOTE :     [address]       [place] [address]             [phone]                 Day of Surgery Procedure(s) (LRB): MINIMALLY INVASIVE MITRAL VALVE (MV) REPLACEMENT Using Carbomedics Optiform Heart Valve Size 58mm (Right) MINIMALLY INVASIVE MAZE PROCEDURE (N/A) TRANSESOPHAGEAL ECHOCARDIOGRAM (TEE) (N/A) Clipping Of Atrial Appendage Using AtriCure PRO2 clip size 69mm (N/A)   Total Length of Stay:  LOS: 0 days  Events:  No events.  On milr and neo. Weaning sedation, and working towards extubation    BP 99/69 (BP Location: Right Arm)   Pulse 80   Temp 97.9 F (36.6 C)   Resp 18   LMP  (LMP Unknown)   SpO2 93%   PAP: (34-47)/(18-24) 34/19 CO:  [2.3 L/min-3.3 L/min] 3.3 L/min CI:  [1.4 L/min/m2-2 L/min/m2] 2 L/min/m2  Vent Mode: SIMV;PSV;PRVC FiO2 (%):  [60 %-100 %] 90 % Set Rate:  [12 bmp-18 bmp] 18 bmp Vt Set:  [430 mL] 430 mL PEEP:  [5 cmH20] 5 cmH20 Pressure Support:  [10 cmH20] 10 cmH20 Plateau Pressure:  [18 cmH20-20 cmH20] 20 cmH20  . sodium chloride 20 mL/hr at 05/24/19 1500  . sodium chloride 100 mL/hr at 05/24/19 1500  . [START ON 05/25/2019] sodium chloride    . sodium chloride 10 mL/hr (05/24/19 1441)  . albumin human 12.5 g (05/24/19 1434)  . cefUROXime (ZINACEF)  IV    . dexmedetomidine (PRECEDEX) IV infusion 0.5 mcg/kg/hr (05/24/19 1500)  . famotidine (PEPCID) IV Stopped (05/24/19 1444)  . insulin 1.9 mL/hr at 05/24/19 1500  . lactated ringers    . lactated ringers    . lactated ringers    . magnesium sulfate 20 mL/hr at 05/24/19 1500  . milrinone 0.3 mcg/kg/min (05/24/19 1500)  . nitroGLYCERIN Stopped (05/24/19 1422)  . phenylephrine (NEO-SYNEPHRINE) Adult infusion 5 mcg/min (05/24/19 1500)  . potassium chloride 10 mEq (05/24/19 1503)  . vancomycin      No intake/output data recorded.   CBC Latest Ref Rng & Units 05/24/2019 05/24/2019 05/24/2019  WBC 4.0 - 10.5 K/uL - - 13.9(H)  Hemoglobin 12.0 - 15.0  g/dL 9.2(L) 11.6(L) 11.2(L)  Hematocrit 36.0 - 46.0 % 27.0(L) 34.0(L) 34.2(L)  Platelets 150 - 400 K/uL - - 108(L)    BMP Latest Ref Rng & Units 05/24/2019 05/24/2019 05/24/2019  Glucose 70 - 99 mg/dL - - 136(H)  BUN 8 - 23 mg/dL - - 21  Creatinine 0.44 - 1.00 mg/dL - - 1.03(H)  BUN/Creat Ratio 12 - 28 - - -  Sodium 135 - 145 mmol/L 144 144 139  Potassium 3.5 - 5.1 mmol/L 3.9 4.2 3.9  Chloride 98 - 111 mmol/L - - 106  CO2 22 - 32 mmol/L - - 23  Calcium 8.9 - 10.3 mg/dL - - 9.2    ABG    Component Value Date/Time   PHART 7.295 (L) 05/24/2019 1521   PCO2ART 47.4 05/24/2019 1521   PO2ART 83.0 05/24/2019 1521   HCO3 23.2 05/24/2019 1521   TCO2 25 05/24/2019 1521   ACIDBASEDEF 3.0 (H) 05/24/2019 1521   O2SAT 95.0 05/24/2019 [place], MD 05/24/2019 3:54 PM

## 2019-05-24 NOTE — Interval H&P Note (Signed)
History and Physical Interval Note:  05/24/2019 7:12 AM  Melody Torres  has presented today for surgery, with the diagnosis of MS MR TR AFIB.  The various methods of treatment have been discussed with the patient and family. After consideration of risks, benefits and other options for treatment, the patient has consented to  Procedure(s): MINIMALLY INVASIVE MITRAL VALVE (MV) REPLACEMENT (Right) MINIMALLY INVASIVE MAZE PROCEDURE (N/A) possible MINIMALLY INVASIVE TRICUSPID VALVE REPAIR (Right) TRANSESOPHAGEAL ECHOCARDIOGRAM (TEE) (N/A) as a surgical intervention.  The patient's history has been reviewed, patient examined, no change in status, stable for surgery.  I have reviewed the patient's chart and labs.  Questions were answered to the patient's satisfaction.     Rexene Alberts

## 2019-05-24 NOTE — Anesthesia Preprocedure Evaluation (Signed)
Anesthesia Evaluation  Patient identified by MRN, date of birth, ID band Patient awake    Reviewed: Allergy & Precautions, NPO status , Patient's Chart, lab work & pertinent test results  Airway Mallampati: II  TM Distance: >3 FB Neck ROM: Full    Dental  (+) Teeth Intact, Dental Advisory Given   Pulmonary    breath sounds clear to auscultation       Cardiovascular  Rhythm:Irregular Rate:Normal + Systolic murmurs    Neuro/Psych    GI/Hepatic   Endo/Other  diabetes  Renal/GU      Musculoskeletal   Abdominal   Peds  Hematology   Anesthesia Other Findings   Reproductive/Obstetrics                             Anesthesia Physical Anesthesia Plan  ASA: III  Anesthesia Plan: General   Post-op Pain Management:    Induction: Intravenous  PONV Risk Score and Plan: Ondansetron and Dexamethasone  Airway Management Planned: Double Lumen EBT  Additional Equipment: Arterial line, CVP, PA Cath, 3D TEE and Ultrasound Guidance Line Placement  Intra-op Plan:   Post-operative Plan: Possible Post-op intubation/ventilation  Informed Consent: I have reviewed the patients History and Physical, chart, labs and discussed the procedure including the risks, benefits and alternatives for the proposed anesthesia with the patient or authorized representative who has indicated his/her understanding and acceptance.       Plan Discussed with: CRNA and Anesthesiologist  Anesthesia Plan Comments:         Anesthesia Quick Evaluation

## 2019-05-24 NOTE — Anesthesia Procedure Notes (Addendum)
Central Venous Catheter Insertion Performed by: Roberts Gaudy, MD, anesthesiologist Start/End7/29/2020 8:00 AM, 05/24/2019 8:10 AM Patient location: OR. Preanesthetic checklist: patient identified, IV checked, site marked, risks and benefits discussed, surgical consent, monitors and equipment checked, pre-op evaluation, timeout performed and anesthesia consent Hand hygiene performed  and maximum sterile barriers used  Catheter size: 8.5 Fr Swan type:thermodilution Procedure performed without using ultrasound guided technique. Attempts: 1 Following insertion, line sutured, dressing applied and Biopatch. Post procedure assessment: blood return through all ports, free fluid flow and no air  Patient tolerated the procedure well with no immediate complications.

## 2019-05-24 NOTE — Anesthesia Procedure Notes (Signed)
Central Venous Catheter Insertion Performed by: Roberts Gaudy, MD, anesthesiologist Start/End7/29/2020 8:00 AM, 05/24/2019 8:10 AM Patient location: OR. Preanesthetic checklist: patient identified, IV checked, site marked, risks and benefits discussed, surgical consent, monitors and equipment checked, pre-op evaluation, timeout performed and anesthesia consent Lidocaine 1% used for infiltration and patient sedated Hand hygiene performed  and maximum sterile barriers used  Catheter size: 8.5 Fr Sheath introducer Procedure performed without using ultrasound guided technique. Attempts: 1 Following insertion, line sutured and dressing applied. Post procedure assessment: blood return through all ports, free fluid flow and no air  Patient tolerated the procedure well with no immediate complications.

## 2019-05-25 ENCOUNTER — Encounter (HOSPITAL_COMMUNITY): Payer: Self-pay | Admitting: Thoracic Surgery (Cardiothoracic Vascular Surgery)

## 2019-05-25 ENCOUNTER — Inpatient Hospital Stay (HOSPITAL_COMMUNITY): Payer: BC Managed Care – PPO

## 2019-05-25 LAB — CBC
HCT: 31.3 % — ABNORMAL LOW (ref 36.0–46.0)
HCT: 34.6 % — ABNORMAL LOW (ref 36.0–46.0)
Hemoglobin: 9.9 g/dL — ABNORMAL LOW (ref 12.0–15.0)
Hemoglobin: 11 g/dL — ABNORMAL LOW (ref 12.0–15.0)
MCH: 29.3 pg (ref 26.0–34.0)
MCH: 29.4 pg (ref 26.0–34.0)
MCHC: 31.6 g/dL (ref 30.0–36.0)
MCHC: 31.8 g/dL (ref 30.0–36.0)
MCV: 92.6 fL (ref 80.0–100.0)
MCV: 92.5 fL (ref 80.0–100.0)
Platelets: 104 10*3/uL — ABNORMAL LOW (ref 150–400)
Platelets: 106 10*3/uL — ABNORMAL LOW (ref 150–400)
RBC: 3.38 MIL/uL — ABNORMAL LOW (ref 3.87–5.11)
RBC: 3.74 MIL/uL — ABNORMAL LOW (ref 3.87–5.11)
RDW: 13.5 % (ref 11.5–15.5)
RDW: 13.9 % (ref 11.5–15.5)
WBC: 16.6 10*3/uL — ABNORMAL HIGH (ref 4.0–10.5)
WBC: 18.8 10*3/uL — ABNORMAL HIGH (ref 4.0–10.5)
nRBC: 0 % (ref 0.0–0.2)
nRBC: 0 % (ref 0.0–0.2)

## 2019-05-25 LAB — POCT I-STAT 7, (LYTES, BLD GAS, ICA,H+H)
Acid-Base Excess: 1 mmol/L (ref 0.0–2.0)
Acid-Base Excess: 2 mmol/L (ref 0.0–2.0)
Acid-base deficit: 2 mmol/L (ref 0.0–2.0)
Acid-base deficit: 4 mmol/L — ABNORMAL HIGH (ref 0.0–2.0)
Acid-base deficit: 3 mmol/L — ABNORMAL HIGH (ref 0.0–2.0)
Acid-base deficit: 3 mmol/L — ABNORMAL HIGH (ref 0.0–2.0)
Bicarbonate: 26.8 mmol/L (ref 20.0–28.0)
Bicarbonate: 24.8 mmol/L (ref 20.0–28.0)
Bicarbonate: 22.7 mmol/L (ref 20.0–28.0)
Bicarbonate: 23 mmol/L (ref 20.0–28.0)
Bicarbonate: 23.4 mmol/L (ref 20.0–28.0)
Bicarbonate: 21.7 mmol/L (ref 20.0–28.0)
Calcium, Ion: 1.24 mmol/L (ref 1.15–1.40)
Calcium, Ion: 0.95 mmol/L — ABNORMAL LOW (ref 1.15–1.40)
Calcium, Ion: 1.22 mmol/L (ref 1.15–1.40)
Calcium, Ion: 1.24 mmol/L (ref 1.15–1.40)
Calcium, Ion: 1.27 mmol/L (ref 1.15–1.40)
Calcium, Ion: 1.17 mmol/L (ref 1.15–1.40)
HCT: 37 % (ref 36.0–46.0)
HCT: 29 % — ABNORMAL LOW (ref 36.0–46.0)
HCT: 31 % — ABNORMAL LOW (ref 36.0–46.0)
HCT: 32 % — ABNORMAL LOW (ref 36.0–46.0)
HCT: 30 % — ABNORMAL LOW (ref 36.0–46.0)
HCT: 30 % — ABNORMAL LOW (ref 36.0–46.0)
Hemoglobin: 12.6 g/dL (ref 12.0–15.0)
Hemoglobin: 9.9 g/dL — ABNORMAL LOW (ref 12.0–15.0)
Hemoglobin: 10.5 g/dL — ABNORMAL LOW (ref 12.0–15.0)
Hemoglobin: 10.9 g/dL — ABNORMAL LOW (ref 12.0–15.0)
Hemoglobin: 10.2 g/dL — ABNORMAL LOW (ref 12.0–15.0)
Hemoglobin: 10.2 g/dL — ABNORMAL LOW (ref 12.0–15.0)
O2 Saturation: 100 %
O2 Saturation: 100 %
O2 Saturation: 100 %
O2 Saturation: 95 %
O2 Saturation: 91 %
O2 Saturation: 97 %
Patient temperature: 36.2
Patient temperature: 98
Patient temperature: 97.6
Potassium: 4.2 mmol/L (ref 3.5–5.1)
Potassium: 4.5 mmol/L (ref 3.5–5.1)
Potassium: 4 mmol/L (ref 3.5–5.1)
Potassium: 5 mmol/L (ref 3.5–5.1)
Potassium: 4.7 mmol/L (ref 3.5–5.1)
Potassium: 5 mmol/L (ref 3.5–5.1)
Sodium: 139 mmol/L (ref 135–145)
Sodium: 140 mmol/L (ref 135–145)
Sodium: 142 mmol/L (ref 135–145)
Sodium: 142 mmol/L (ref 135–145)
Sodium: 141 mmol/L (ref 135–145)
Sodium: 139 mmol/L (ref 135–145)
TCO2: 28 mmol/L (ref 22–32)
TCO2: 26 mmol/L (ref 22–32)
TCO2: 24 mmol/L (ref 22–32)
TCO2: 24 mmol/L (ref 22–32)
TCO2: 25 mmol/L (ref 22–32)
TCO2: 23 mmol/L (ref 22–32)
pCO2 arterial: 46.1 mmHg (ref 32.0–48.0)
pCO2 arterial: 30.8 mmHg — ABNORMAL LOW (ref 32.0–48.0)
pCO2 arterial: 39.2 mmHg (ref 32.0–48.0)
pCO2 arterial: 45.9 mmHg (ref 32.0–48.0)
pCO2 arterial: 47.2 mmHg (ref 32.0–48.0)
pCO2 arterial: 37.3 mmHg (ref 32.0–48.0)
pH, Arterial: 7.373 (ref 7.350–7.450)
pH, Arterial: 7.513 — ABNORMAL HIGH (ref 7.350–7.450)
pH, Arterial: 7.372 (ref 7.350–7.450)
pH, Arterial: 7.303 — ABNORMAL LOW (ref 7.350–7.450)
pH, Arterial: 7.301 — ABNORMAL LOW (ref 7.350–7.450)
pH, Arterial: 7.37 (ref 7.350–7.450)
pO2, Arterial: 342 mmHg — ABNORMAL HIGH (ref 83.0–108.0)
pO2, Arterial: 366 mmHg — ABNORMAL HIGH (ref 83.0–108.0)
pO2, Arterial: 284 mmHg — ABNORMAL HIGH (ref 83.0–108.0)
pO2, Arterial: 78 mmHg — ABNORMAL LOW (ref 83.0–108.0)
pO2, Arterial: 66 mmHg — ABNORMAL LOW (ref 83.0–108.0)
pO2, Arterial: 91 mmHg (ref 83.0–108.0)

## 2019-05-25 LAB — POCT I-STAT 4, (NA,K, GLUC, HGB,HCT)
Glucose, Bld: 139 mg/dL — ABNORMAL HIGH (ref 70–99)
Glucose, Bld: 145 mg/dL — ABNORMAL HIGH (ref 70–99)
Glucose, Bld: 135 mg/dL — ABNORMAL HIGH (ref 70–99)
Glucose, Bld: 156 mg/dL — ABNORMAL HIGH (ref 70–99)
Glucose, Bld: 160 mg/dL — ABNORMAL HIGH (ref 70–99)
Glucose, Bld: 142 mg/dL — ABNORMAL HIGH (ref 70–99)
HCT: 36 % (ref 36.0–46.0)
HCT: 36 % (ref 36.0–46.0)
HCT: 28 % — ABNORMAL LOW (ref 36.0–46.0)
HCT: 27 % — ABNORMAL LOW (ref 36.0–46.0)
HCT: 28 % — ABNORMAL LOW (ref 36.0–46.0)
HCT: 29 % — ABNORMAL LOW (ref 36.0–46.0)
Hemoglobin: 12.2 g/dL (ref 12.0–15.0)
Hemoglobin: 12.2 g/dL (ref 12.0–15.0)
Hemoglobin: 9.5 g/dL — ABNORMAL LOW (ref 12.0–15.0)
Hemoglobin: 9.2 g/dL — ABNORMAL LOW (ref 12.0–15.0)
Hemoglobin: 9.5 g/dL — ABNORMAL LOW (ref 12.0–15.0)
Hemoglobin: 9.9 g/dL — ABNORMAL LOW (ref 12.0–15.0)
Potassium: 4.2 mmol/L (ref 3.5–5.1)
Potassium: 4.4 mmol/L (ref 3.5–5.1)
Potassium: 4.5 mmol/L (ref 3.5–5.1)
Potassium: 4.8 mmol/L (ref 3.5–5.1)
Potassium: 5.2 mmol/L — ABNORMAL HIGH (ref 3.5–5.1)
Potassium: 4 mmol/L (ref 3.5–5.1)
Sodium: 140 mmol/L (ref 135–145)
Sodium: 140 mmol/L (ref 135–145)
Sodium: 141 mmol/L (ref 135–145)
Sodium: 139 mmol/L (ref 135–145)
Sodium: 141 mmol/L (ref 135–145)
Sodium: 141 mmol/L (ref 135–145)

## 2019-05-25 LAB — BASIC METABOLIC PANEL
Anion gap: 5 (ref 5–15)
Anion gap: 6 (ref 5–15)
BUN: 13 mg/dL (ref 8–23)
BUN: 15 mg/dL (ref 8–23)
CO2: 22 mmol/L (ref 22–32)
CO2: 24 mmol/L (ref 22–32)
Calcium: 8 mg/dL — ABNORMAL LOW (ref 8.9–10.3)
Calcium: 8.2 mg/dL — ABNORMAL LOW (ref 8.9–10.3)
Chloride: 110 mmol/L (ref 98–111)
Chloride: 105 mmol/L (ref 98–111)
Creatinine, Ser: 0.83 mg/dL (ref 0.44–1.00)
Creatinine, Ser: 0.99 mg/dL (ref 0.44–1.00)
GFR calc Af Amer: >60 mL/min (ref >60)
GFR calc Af Amer: >60 mL/min (ref >60)
GFR calc non Af Amer: >60 mL/min (ref >60)
GFR calc non Af Amer: 60 mL/min — ABNORMAL LOW (ref >60)
Glucose, Bld: 114 mg/dL — ABNORMAL HIGH (ref 70–99)
Glucose, Bld: 210 mg/dL — ABNORMAL HIGH (ref 70–99)
Potassium: 4.9 mmol/L (ref 3.5–5.1)
Potassium: 4.7 mmol/L (ref 3.5–5.1)
Sodium: 137 mmol/L (ref 135–145)
Sodium: 135 mmol/L (ref 135–145)

## 2019-05-25 LAB — GLUCOSE, CAPILLARY
Glucose-Capillary: 112 mg/dL — ABNORMAL HIGH (ref 70–99)
Glucose-Capillary: 115 mg/dL — ABNORMAL HIGH (ref 70–99)
Glucose-Capillary: 122 mg/dL — ABNORMAL HIGH (ref 70–99)
Glucose-Capillary: 126 mg/dL — ABNORMAL HIGH (ref 70–99)
Glucose-Capillary: 126 mg/dL — ABNORMAL HIGH (ref 70–99)
Glucose-Capillary: 127 mg/dL — ABNORMAL HIGH (ref 70–99)
Glucose-Capillary: 134 mg/dL — ABNORMAL HIGH (ref 70–99)
Glucose-Capillary: 138 mg/dL — ABNORMAL HIGH (ref 70–99)
Glucose-Capillary: 139 mg/dL — ABNORMAL HIGH (ref 70–99)
Glucose-Capillary: 149 mg/dL — ABNORMAL HIGH (ref 70–99)
Glucose-Capillary: 169 mg/dL — ABNORMAL HIGH (ref 70–99)
Glucose-Capillary: 196 mg/dL — ABNORMAL HIGH (ref 70–99)

## 2019-05-25 LAB — MAGNESIUM
Magnesium: 2.8 mg/dL — ABNORMAL HIGH (ref 1.7–2.4)
Magnesium: 2.4 mg/dL (ref 1.7–2.4)

## 2019-05-25 MED ORDER — ORAL CARE MOUTH RINSE
15.0000 mL | Freq: Two times a day (BID) | OROMUCOSAL | Status: DC
  Administered 2019-05-25 – 2019-05-29 (×6): 15 mL via OROMUCOSAL

## 2019-05-25 MED ORDER — INSULIN ASPART 100 UNIT/ML ~~LOC~~ SOLN
0.0000 [IU] | SUBCUTANEOUS | Status: DC
  Administered 2019-05-25 (×2): 4 [IU] via SUBCUTANEOUS
  Administered 2019-05-26 (×2): 2 [IU] via SUBCUTANEOUS

## 2019-05-25 MED ORDER — WARFARIN - PHYSICIAN DOSING INPATIENT
Freq: Every day | Status: DC
  Administered 2019-05-29: 18:00:00

## 2019-05-25 MED ORDER — FUROSEMIDE 10 MG/ML IJ SOLN
20.0000 mg | Freq: Four times a day (QID) | INTRAMUSCULAR | Status: AC
  Administered 2019-05-25 – 2019-05-26 (×3): 20 mg via INTRAVENOUS
  Filled 2019-05-25 (×3): qty 2

## 2019-05-25 MED ORDER — WARFARIN SODIUM 2.5 MG PO TABS
2.5000 mg | ORAL_TABLET | Freq: Every day | ORAL | Status: DC
  Administered 2019-05-25 – 2019-05-26 (×2): 2.5 mg via ORAL
  Filled 2019-05-25 (×2): qty 1

## 2019-05-25 MED ORDER — ASPIRIN EC 81 MG PO TBEC
81.0000 mg | DELAYED_RELEASE_TABLET | Freq: Every day | ORAL | Status: DC
  Administered 2019-05-26 – 2019-05-30 (×5): 81 mg via ORAL
  Filled 2019-05-25 (×5): qty 1

## 2019-05-25 MED ORDER — ENOXAPARIN SODIUM 40 MG/0.4ML ~~LOC~~ SOLN
40.0000 mg | Freq: Every day | SUBCUTANEOUS | Status: DC
  Administered 2019-05-26 – 2019-05-29 (×4): 40 mg via SUBCUTANEOUS
  Filled 2019-05-25 (×4): qty 0.4

## 2019-05-25 NOTE — Progress Notes (Signed)
Anesthesiology Follow-up:  Awake and alert, in good spirits, neuro intact, hemodynamically stable, milrinone and phenylephrine weaned off.  VS: T- 36.9 BP- 120/64 HR- 80 (a-paced) RR- 15 O2 Sat 98% on 4L [place] PA 41/25 CO/CI- 4.0/2.6  ABG 4L [place] PH- 7.37 PCO2- 37 PO2-91 K-5.0 BUN/Cr- 13/0.83 glucose- 114 H/H- 10.2/30.0 Platelets- 104,000  CXR: R. Lung airspace disease, atelectasis vs asymmetric pulm edema  63 year old female with severe MS, atrial fib and pulmonary hypertension secondary to rheumatic heart disease. One day S/P mitral valve replacement with Sorin Carbometics 33 mm bileaflet mechanical valve, and maze procedure.  Extubated 5 hours post-op.  Doing well overall, good pain control, pulmonary toilet for R. Lung airspace disease.  Roberts Gaudy

## 2019-05-25 NOTE — Discharge Summary (Signed)
Physician Discharge Summary  Patient ID: Melody Torres MRN: [id number] DOB/AGE: [birth year] 63 y.o.  Admit date: 05/24/2019 Discharge date: 05/30/2019  Admission Diagnoses:    Rheumatic heart disease   Severe mitral valve stenosis   Rheumatic mitral regurgitation   Tricuspid regurgitation   Chronic diastolic heart failure   Paroxysmal atrial fibrillation (HCC)   Type 2 diabetes mellitus  Discharge Diagnoses:  Principal Problem:   S/P minimally invasive mitral valve replacement with mechanical valve + maze procedure Active Problems:     S/P Minimally invasive maze operation for atrial fibrillation Rheumatic heart disease Severe mitral valve stenosis Rheumatic mitral regurgitation Tricuspid regurgitation Chronic diastolic heart failure Paroxysmal atrial fibrillation (HCC)  Type 2 diabetes mellitus   Discharged Condition: Stable  History of Present Illness:  Patient is a 63 year old female referred for surgical consultation to discuss treatment options for rheumatic heart disease with combination of severe mitral stenosis, mitral regurgitation, chronic diastolic congestive heart failure, andatrial fibrillation.  Patient states that she became ill at age 18 and has a heart murmur ever since. She was later diagnosed with rheumatic heart disease. She has been followed intermittently for many years.In 2008 she first developed paroxysmal atrial fibrillation. She was found to have mitral stenosis and mitral regurgitation. She has been chronically anticoagulated using warfarin ever since. For the last several years she has been followed by Dr. Agustin Torres.Echocardiograms have documented the presence of normal left ventricular systolic function with severe mitral stenosis and mitral regurgitation. Over the past 6 to 12 months the patient has developed decreased exercise tolerance with increasing exertional shortness of breath and fatigue. Transthoracic echocardiogram performed  September 14, 2018 revealed normal left ventricular systolic function with severe rheumatic mitral valve disease including severe mitral stenosis and moderate mitral regurgitation. There was severe left atrial enlargement and evidence of pulmonary hypertension. There was mild tricuspid regurgitation. The patient was referred to Dr. Mina Torres at [facility] to consider balloon mitral valvuloplasty. Transesophageal echocardiogram was recommended and performed on January 06, 2019. This confirmed the presence of severe rheumatic mitral valve disease with severe thickening and calcification of the mitral valve leaflets.There was severe mitral stenosis and severe mitral regurgitation. There was moderate tricuspid regurgitation. Left ventricular systolic function remain normal. The aortic valve was normal. The patient's mitral valve was felt not suitable for balloon mitral valvuloplasty. Referral for possible mitral valve replacement was delayed because of the COVID-19 pandemic. Eventually the patient underwent left and right heart catheterization on April 07, 2019. The patient was found to have mild nonobstructive coronary artery disease. There was mild to moderate pulmonary hypertension and normal left ventricular end-diastolic pressure. Cardiothoracic surgical consultation was requested.  Patient is widowed and lives alone in [place]. 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 Her sister lives nearby and is very supportive, accompanying her for her office consultation visit today. They both enjoy walking and line dancing. The patient complains that over the last 6 to 12 months she has developed progressive symptoms of exertional shortness of breath and fatigue. She gets short of breath quickly if she walks up an incline. She denies resting shortness of breath, PND, orthopnea, or lower extremity edema. She has intermittent palpitations and at times can feel her heart  beating erratically. She denies any chest pain or chest tightness either with activity or at rest. She has had occasional slight dizzy spells without any syncopal episodes.She has been chronically anticoagulated using warfarin since 2008. She has not had any significant bleeding  complications nor any history of TIA or stroke.  Update to HPI : Office visit 05/22/19 Patient is a 63 year old female with history of rheumatic heart disease including severe mitral stenosis, mitral regurgitation, chronic diastolic congestive heart failure and persistent atrial fibrillation who returns to the office today with tentative plans to proceed with minimally invasive mitral valve replacement, possible tricuspid valve repair, and Maze procedure later this week. She was originally seen in consultation on April 20, 2019. She underwent CT angiography which reveals no contraindication to peripheral cannulation for surgery. She reports no new problems or complaints over the past few weeks. She has been practicing social distancing, wearing a mask whenever in public, and she has not been exposed to any persons with known or suspected COVID-19 infection. She describes stable symptoms of exertional shortness of breath.  Hospital Course:  As dictated by Melody Dunks PA-C: Ms. Melody Torres was admitted for same-day surgery and taken to the OR 05/24/19 where mini mitral valve replacement with a mechanical valve and a complete Maze procedure were performed without complication. She separated from cardiopulmonary bypass on milrinone and epinephrine drips. She was transferred to the CVICU in stable condition. She was extubated during the evening on the day of surgery without difficulty. She remained hemodynamically stable overnight and the inotropic support was weaned on POD-1. Monitoring lines were removed and she was mobilized. She was in a slow junctional rhythm on POD1 so atrial pacing with the epicardial leads was  continued. Anticoagulation with Coumadin for her chronic PAF and for the mechanical mitral valve was initiated on POD-1. INR was monitored daily. As of 08/4, she is on 7.5 mg of Coumadin and her latest INR is up to 1.3.  This is her baseline regimen of coumadin.   Addendum: She is ambulating on room air. She has been tolerating a diet and ha had a bowel movement. Epicardial pacing wires were removed on 08/01. Chest tube output gradually decreased and they were removed on 08/03. Chest tube sutures will be removed in the office after discharge. Patient's right chest wound is clean and dry. She is felt surgically stable for discharge today.   Consults: none  Significant Diagnostic Studies:   Transthoracic Echocardiography   Patient: Melody Torres, Melody Torres  MR #: [id number]  Study Date: 09/14/2018  Gender: F  Age: 41  Height: 162.6 cm  Weight: 67.9 kg  BSA: 1.76 m^2  Pt. Status:  Room:  ATTENDING Jenne Campus, MD  ORDERING Jenne Campus, MD  REFERRING Jenne Campus, MD  SONOGRAPHER Jimmy Reel, RDCS  PERFORMING [facility] [place]  cc:  -------------------------------------------------------------------  LV EF: 55% - 60%  -------------------------------------------------------------------  Indications: Rheumatic AS/MS 396.0.  -------------------------------------------------------------------  History: PMH: Atrial fibrillation.  -------------------------------------------------------------------  Study Conclusions  - Left ventricle: The cavity size was normal. There was mild  concentric hypertrophy. Systolic function was normal. The  estimated ejection fraction was in the range of 55% to 60%. Wall  motion was normal; there were no regional wall motion  abnormalities.  - Aortic valve: Valve area (VTI): 1.67 cm^2. Valve area (Vmax):  1.75 cm^2. Valve area (Vmean): 1.71 cm^2.  - Mitral valve: Calcification. Mobility was restricted. The  findings are consistent with severe  stenosis. There was moderate  regurgitation. Valve area by pressure half-time: 1.09 cm^2. Valve  area by continuity equation (using LVOT flow): 0.7 cm^2.  - Left atrium: The atrium was severely dilated.  - Pulmonary arteries: PA peak pressure: 39 mm Hg (S).  Impressions:  - Normal LVEF.  Severe LAE.  Severe MS (rheumatic?)  Moderate MR  Mild TR.  PAP 39 mmHg.  -------------------------------------------------------------------  Study data: No prior study was available for comparison. Study  status: Routine. Procedure: The patient reported no pain pre or  post test. Transthoracic echocardiography. Image quality was  adequate. Transthoracic echocardiography. M-mode,  complete 2D, spectral Doppler, and color Doppler. Birthdate:  Patient birthdate: 12-Jan-1956. Age: Patient is 63 yr old. Sex:  Gender: female. BMI: 25.7 kg/m^2. Blood pressure: 118/62  Patient status: Outpatient. Study date: Study date: 09/14/2018.  Study time: 11:10 AM. Location: Echo laboratory.  -------------------------------------------------------------------  -------------------------------------------------------------------  Left ventricle: The cavity size was normal. There was mild  concentric hypertrophy. Systolic function was normal. The estimated  ejection fraction was in the range of 55% to 60%. Wall motion was  normal; there were no regional wall motion abnormalities.  -------------------------------------------------------------------  Aortic valve: Trileaflet; normal thickness leaflets. Mobility was  not restricted. Doppler: Transvalvular velocity was within the  normal range. There was no stenosis. There was no regurgitation.  VTI ratio of LVOT to aortic valve: 0.59. Valve area (VTI): 1.67  cm^2. Indexed valve area (VTI): 0.95 cm^2/m^2. Peak velocity ratio  of LVOT to aortic valve: 0.62. Valve area (Vmax): 1.75 cm^2.  Indexed valve area (Vmax): 0.99 cm^2/m^2. Mean velocity ratio of  LVOT to aortic  valve: 0.6. Valve area (Vmean): 1.71 cm^2. Indexed  valve area (Vmean): 0.97 cm^2/m^2. Mean gradient (S): 9 mm Hg.  Peak gradient (S): 17 mm Hg.  -------------------------------------------------------------------  Aorta: Aortic root: The aortic root was normal in size.  -------------------------------------------------------------------  Mitral valve: Calcification. Mobility was restricted. Doppler:  The findings are consistent with severe stenosis. There was  moderate regurgitation. Valve area by pressure half-time: 1.09  cm^2. Indexed valve area by pressure half-time: 0.62 cm^2/m^2.  Valve area by continuity equation (using LVOT flow): 0.7 cm^2.  Indexed valve area by continuity equation (using LVOT flow): 0.4  cm^2/m^2. Mean gradient (D): 12 mm Hg. Peak gradient (D): 21 mm  Hg.  -------------------------------------------------------------------  Left atrium: The atrium was severely dilated.  -------------------------------------------------------------------  Right ventricle: The cavity size was normal. Wall thickness was  normal. Systolic function was normal.  -------------------------------------------------------------------  Pulmonic valve: Structurally normal valve. Cusp separation was  normal. Doppler: Transvalvular velocity was within the normal  range. There was no evidence for stenosis. There was trivial  regurgitation.  -------------------------------------------------------------------  Tricuspid valve: Structurally normal valve. Doppler:  Transvalvular velocity was within the normal range. There was mild  regurgitation.  -------------------------------------------------------------------  Pulmonary artery: The main pulmonary artery was normal-sized.  Systolic pressure was within the normal range.  -------------------------------------------------------------------  Right atrium: The atrium was normal in size.    -------------------------------------------------------------------  Pericardium: There was no pericardial effusion.  -------------------------------------------------------------------  Systemic veins:  Inferior vena cava: The vessel was normal in size.  -------------------------------------------------------------------  Measurements  Left ventricle Value Reference  LV ID, ED, PLAX chordal (H) 54 mm 43 - 52  LV ID, ES, PLAX chordal 38 mm 23 - 38  LV fx shortening, PLAX chordal 30 % >=29  LV PW thickness, ED 13 mm ----------  IVS/LV PW ratio, ED 1 <=1.3  Stroke volume, 2D 77 ml ----------  Stroke volume/bsa, 2D 44 ml/m^2 ----------  LV e&', lateral 3.66 cm/s ----------  LV E/e&', lateral 62.57 ----------  LV e&', medial 3.43 cm/s ----------  LV E/e&', medial 66.76 ----------  LV e&', average 3.55 cm/s ----------  LV E/e&', average 64.6 ----------  Ventricular septum Value Reference  IVS thickness, ED 13 mm ----------  LVOT Value Reference  LVOT ID, S 19 mm ----------  LVOT area 2.84 cm^2 ----------  LVOT peak velocity, S 129 cm/s ----------  LVOT mean velocity, S 83.6 cm/s ----------  LVOT VTI, S 27.2 cm ----------  LVOT peak gradient, S 7 mm Hg ----------  Aortic valve Value Reference  Aortic valve peak velocity, S 209 cm/s ----------  Aortic valve mean velocity, S 139 cm/s ----------  Aortic valve VTI, S 46.3 cm ----------  Aortic mean gradient, S 9 mm Hg ----------  Aortic peak gradient, S 17 mm Hg ----------  VTI ratio, LVOT/AV 0.59 ----------  Aortic valve area, VTI 1.67 cm^2 ----------  Aortic valve area/bsa, VTI 0.95 cm^2/m^2 ----------  Velocity ratio, peak, LVOT/AV 0.62 ----------  Aortic valve area, peak velocity 1.75 cm^2 ----------  Aortic valve area/bsa, peak 0.99 cm^2/m^2 ----------  velocity  Velocity ratio, mean, LVOT/AV 0.6 ----------  Aortic valve area, mean velocity 1.71 cm^2 ----------  Aortic valve area/bsa, mean 0.97 cm^2/m^2 ----------  velocity   Aorta Value Reference  Aortic root ID, ED 36 mm ----------  Left atrium Value Reference  LA ID, A-P, ES 43 mm ----------  LA ID/bsa, A-P (H) 2.44 cm/m^2 <=2.2  LA volume, S 100 ml ----------  LA volume/bsa, S 56.7 ml/m^2 ----------  LA volume, ES, 1-p A4C 102 ml ----------  LA volume/bsa, ES, 1-p A4C 57.8 ml/m^2 ----------  LA volume, ES, 1-p A2C 90.4 ml ----------  LA volume/bsa, ES, 1-p A2C 51.2 ml/m^2 ----------  Mitral valve Value Reference  Mitral E-wave peak velocity 229 cm/s ----------  Mitral A-wave peak velocity 196 cm/s ----------  Mitral mean velocity, D 159 cm/s ----------  Mitral deceleration time (H) 687 ms 150 - 230  Mitral pressure half-time 201 ms ----------  Mitral mean gradient, D 12 mm Hg ----------  Mitral peak gradient, D 21 mm Hg ----------  Mitral E/A ratio, peak 1.2 ----------  Mitral valve area, PHT, DP 1.09 cm^2 ----------  Mitral valve area/bsa, PHT, DP 0.62 cm^2/m^2 ----------  Mitral valve area, LVOT 0.7 cm^2 ----------  continuity  Mitral valve area/bsa, LVOT 0.4 cm^2/m^2 ----------  continuity  Mitral annulus VTI, D 110 cm ----------  Mitral maximal regurg velocity, 550 cm/s ----------  PISA  Mitral regurg VTI, PISA 186 cm ----------  Mitral ERO, PISA 0.22 cm^2 ----------  Mitral regurg volume, PISA 41 ml ----------  Pulmonary arteries Value Reference  PA pressure, S, DP (H) 39 mm Hg <=30  Tricuspid valve Value Reference  Tricuspid regurg peak velocity 301 cm/s ----------  Tricuspid peak RV-RA gradient 36 mm Hg ----------  Right atrium Value Reference  RA ID, S-I, ES, A4C (H) 56 mm 34 - 49  RA area, ES, A4C 14.3 cm^2 8.3 - 19.5  RA volume, ES, A/L 32.2 ml ----------  RA volume/bsa, ES, A/L 18.3 ml/m^2 ----------  Right ventricle Value Reference  TAPSE 19.4 mm ----------  RV pressure, S, DP (H) 39 mm Hg <=30  RV s&', lateral, S 12.3 cm/s ----------  Legend:  (L) and (H) mark values outside specified reference range.   -------------------------------------------------------------------  Prepared and Electronically Authenticated by  Jenne Campus, MD  2019-11-20T17:09:51    TRANSESOPHOGEAL ECHO REPORT  Patient Name: Melody Torres Date of Exam: 01/06/2019  Medical Rec #: [id number] Height: 64.0 in  Accession #: [id number] Weight: 142.0 lb  Date of Birth: 31-Oct-1955 BSA: 1.69 m  Patient Age: 73 years BP: 126/82 mmHg  Patient Gender: F HR: 72  bpm.  Exam Location: Outpatient  Procedure: Transesophageal Echo  Indications: rheumatic mitral valve disease  History: Patient has prior history of Echocardiogram examinations, most  recent 09/14/2018. Mitral Valve Disease, Mitral Stenosis and  Mild aortic stenosis. Rheumatic heart disease.  Sonographer: Roseanna Rainbow  Referring Phys: [id number] [place]  Diagnosing Phys: Ena Dawley MD  PROCEDURE: Normal Transesophogeal exam. Local oropharyngeal anesthetic was provided with viscous lidocaine. The transesophogeal probe was passed through the esophogus of the patient. Imaged were obtained with the patient in a left lateral decubitus  position. Image quality was excellent. The patient developed no complications during the procedure.  IMPRESSIONS  1. The right ventricle has normal systolc function. Right ventricular systolic pressure is moderately elevated.  2. Left atrial size was severely dilated.  3. Right atrial size was mildly dilated.  4. Moderately thickened tricuspid valve leaflets.  5. The mitral valve is rheumatic. Severe thickening of the mitral valve leaflet. Severe calcification of the mitral valve leaflet. Mitral valve regurgitation is severe by color flow Doppler. The MR jet is wall-impinging. Critical mitral valve stenosis.  6. Tricuspid valve regurgitation is moderate.  7. The left ventricle has hyperdynamic systolic function, with an ejection fraction of >65%.  SUMMARY  Findings consistent with rheumatic mitral valve disease with   severely thickened and calcified leaflets. (Wilkins score 13)  Severe mitral stenosis with mean transmitral gradient 16 mmHg.  Severe mitral regurgitation with an eccentric jet and reversal of  forward flow in the pulmonary veins.  Moderate pulmonary hypertension with RVSP 47 mmHg.  Severely dilated left atrium.  FINDINGS  Left Ventricle: The left ventricle has hyperdynamic systolic function, with an ejection fraction of >65%.  Right Ventricle: The right ventricle has normal systolic function. Right ventricular systolic pressure is moderately elevated.  Left Atrium: Left atrial size was severely dilated.  Right Atrium: Right atrial size was mildly dilated. Right atrial pressure is estimated at 15 mmHg.  Mitral Valve: The mitral valve is rheumatic. Severe thickening of the mitral valve leaflet. Severe calcification of the mitral valve leaflet. Mitral valve regurgitation is severe by color flow Doppler. The MR jet is wall-impinging. Critical mitral valve  stenosis.  Tricuspid Valve: Tricuspid valve regurgitation is moderate by color flow Doppler. The tricuspid valve is moderately thickened.  Venous: The inferior vena cava was not well visualized.  LV Wall Scoring:  RIGHT ATRIUM  RA Pressure: 15 mmHg  MITRAL VALVE  MV Peak grad: 54.8 mmHg  MV Mean grad: 32.5 mmHg  MV Vmax: 3.70 m/s  MV Vmean: 253.0 cm/s  MV VTI: 1.11 m  MR Peak grad: 105.3 mmHg  MR Mean grad: 54.0 mmHg  MR Vmax: 513.00 cm/s  MR Vmean: 328.0 cm/s  MR PISA: 9.05 cm  MR PISA Eff ROA: 66 mm  MR PISA Radius: 1.20 cm  Ena Dawley MD  Electronically signed by Ena Dawley MD  Signature Date/Time: 01/06/2019/1:33:08 PM    RIGHT/LEFT HEART CATH AND CORONARY ANGIOGRAPHY  Conclusion   Hemodynamic findings consistent with mild pulmonary hypertension.   LV end diastolic pressure is low   Mid LAD lesion is 40% stenosed. Otherwise normal coronary arteries SUMMARY   Mild Pulmonary HTN with normal LVEDP    Moderately reduced Cardiac Output / Index by FICK (Valve disease related)   With exception of mild LAD stenosis after D1, relatively normal coronary Arteries - R dominant RECOMMENDATIONS   Discharge home after bedrest   Follow-up with Dr. Agustin Torres as scheduled.   Proceed with plans for  valve surgery Glenetta Hew, M.D., M.S.  Interventional Cardiologist  Pager # [phone]  Phone # [phone]  [address] [place], [place] [address]   Recommendations  Antiplatelet/Anticoag No indication for antiplatelet therapy at this time .  Discharge Date In the absence of any other complications or medical issues, we expect the patient to be ready for discharge from a cath perspective on 04/07/2019.  Indications  Severe mitral stenosis by prior echocardiogram [I05.0 (ICD-10-CM)]  Chronic rheumatic heart disease [I09.9 (ICD-10-CM)]  Rheumatic mitral regurgitation [I05.1 (ICD-10-CM)]  Procedural Details  Technical Details PCP: Dr. Christa See Cardiologist: Dr. Arlana Hove is a 63 y.o. female with history of rheumatic heart disease resulting in severe mitral stenosis with regurgitation. She has now become symptomatic and is therefore starting stages of preoperative evaluation for valve replacement. She presents here for right and left heart catheterization.  Time Out: Verified patient identification, verified procedure, site/side was marked, verified correct patient position, special equipment/implants available, medications/allergies/relevent history reviewed, required imaging and test results available. Performed.   Access:  * RIGHT Radial Artery: 6 Fr sheath -- Seldinger technique using Angiocath Kit -- Direct ultrasound guidance used. Permanent image obtained and placed on chart. --10 mL radial cocktail IA; 3500 Units IV Heparin * Right Brachial/Antecubital Vein: The existing 18-gauge IV was exchanged over a wire for a 5Fr sheath  Right Heart  Catheterization: 5 Fr Gordy Councilman catheter advanced under fluoroscopy with balloon inflated to the RA, RV, then PCWP-PA for hemodynamic measurement.  * Simultaneous FA & PA blood gases checked for SaO2% to calculate FICK CO/CI  * Catheter removed completely out of the body with balloon deflated.  Left Heart Catheterization: 5Fr Catheters advanced or exchanged over a J-wire under direct fluoroscopic guidance into the ascending aorta; TIG 4.0 catheter advanced first.  * Left & Right Coronary Artery Cineangiography: TIG 4.0 Catheter  * LV Hemodynamics (LV Gram): Angled Pigtail Catheter  Upon completion of LV Hemodynamcs & Aortic Vavle pull-back gradient measurement, the catheter was removed completely out of the body over a wire, without complication.  Brachial Sheath(s) removed in the Cath Lab with manual pressure for hemostasis.   Radial sheath removed in the Cardiac Catheterization lab with TR Band placed for hemostasis.  TR Band: 0830 Hours; 11 mL air  MEDICATIONS * SQ Lidocaine 56m * Radial Cocktail: 3 mg Verapmil in 10 mL NS * Isovue Contrast: 40 mL * Heparin: 3500 Units  Fluoro time: 5.8 minutes. Dose Area Product: 169629mGycm2. Cumulative Air Kerma: 261 mGy.  Estimated blood loss <50 mL.   During this procedure medications were administered to achieve and maintain moderate conscious sedation while the patient's heart rate, blood pressure, and oxygen saturation were continuously monitored and I was present face-to-face 100% of this time.  Medications  (Filter: Administrations occurring from 04/07/19 0726 to 04/07/19 0836)          (important) Continuous medications are totaled by the amount administered until 04/07/19 0836.  Medication Rate/Dose/Volume Action  Date Time   Heparin (Porcine) in NaCl 1000-0.9 UT/500ML-% SOLN (mL) 500 mL Given 04/07/19 0741   Total dose as of 04/07/19 0836 500 mL Given 0742   1,000 mL        fentaNYL (SUBLIMAZE) injection (mcg) 25 mcg  Given 04/07/19 0751   Total dose as of 04/07/19 0836        25 mcg        midazolam (VERSED) injection (mg) 1 mg Given 04/07/19 0751  Total dose as of 04/07/19 0836        1 mg        lidocaine (PF) (XYLOCAINE) 1 % injection (mL) 3 mL Given 04/07/19 0751   Total dose as of 04/07/19 0836        3 mL        Radial Cocktail/Verapamil only (mL) 10 mL Given 04/07/19 0755   Total dose as of 04/07/19 0836        10 mL        heparin injection (Units) 3,500 Units Given 04/07/19 0807   Total dose as of 04/07/19 0836        3,500 Units        iohexol (OMNIPAQUE) 350 MG/ML injection (mL) 40 mL Given 04/07/19 0822   Total dose as of 04/07/19 0836        40 mL        Complications  Complications documented before study signed (04/07/2019 9:41 AM)   No complications were associated with this study.  Documented by Leonie Man, MD - 04/07/2019 8:43 AM  Coronary Findings  Diagnostic  Dominance: Right  Left Main  Vessel was injected. Vessel is normal in caliber. Vessel is angiographically normal.  Left Anterior Descending  Vessel was injected. Vessel is normal in caliber. Vessel is angiographically normal.  Mid LAD lesion 40% stenosed  Mid LAD lesion is 40% stenosed. The lesion is located at the major branch, focal and eccentric.  First Diagonal Branch  Vessel is moderate in size. Moderate-large caliber vessel  First Septal Branch  Vessel is small in size.  Second Diagonal Branch  Vessel is small in size.  Second Septal Branch  Vessel is small in size.  Third Septal Branch  Vessel is small in size.  Left Circumflex  Vessel was injected. Vessel is normal in caliber. Vessel is angiographically normal.  Second Obtuse Marginal Branch  Small to medium caliber  Right Coronary Artery  Vessel was injected. Vessel is normal in caliber. Vessel is angiographically normal.  Right Ventricular Branch  Vessel is small in  size.  Right Posterior Atrioventricular Artery  Vessel is moderate in size.  Intervention  No interventions have been documented.  Right Heart  Right Heart Pressures Hemodynamic findings consistent with mild pulmonary hypertension. PA P 52/20 mmHg-mean 32 mmHg.  PCWP 21 mmHg. V wave 31 mmHg LV EDP is normal. LVP-EDP: 108/0 mmHg - 6 mmHg AoP-MAP: 110/60 mmHg - 80 mmHg Ao sat 97%, PA sat 67%. Cardiac Output-Index Kathlen Brunswick): 4.3-2.41  Right Atrium The right atrial size is normal. Right atrial pressure is normal. RAP mean 61mHg  Right Ventricle The right ventricle is mildly dilated. RVP-EDP: 53/0 mmHg - 3 mmHg  Wall Motion  Resting    No left ventriculogram performed.    Left Heart  Left Ventricle LV end diastolic pressure is low.  Aortic Valve There is no aortic valve stenosis. No calcification found in the aortic valve.  Coronary Diagrams  Diagnostic  Dominance: Right  Intervention  Implants     No implant documentation for this case.  Syngo Images  Link to Procedure Log   Show images for CARDIAC CATHETERIZATION Procedure Log  Images on Long Term Storage    Show images for MAzaylia, Fong  Hemo Data   Most Recent Value  Fick Cardiac Output 4.13 L/min  Fick Cardiac Output Index 2.41 (L/min)/BSA  RA A Wave -99 mmHg  RA V Wave 5 mmHg  RA Mean 3 mmHg  RV Systolic Pressure 53 mmHg  RV Diastolic Pressure -3 mmHg  RV EDP 3 mmHg  PA Systolic Pressure 54 mmHg  PA Diastolic Pressure 18 mmHg  PA Mean 33 mmHg  PW A Wave -99 mmHg  PW V Wave 33 mmHg  PW Mean 21 mmHg  AO Systolic Pressure 98 mmHg  AO Diastolic Pressure 58 mmHg  AO Mean 74 mmHg  LV Systolic Pressure 914 mmHg  LV Diastolic Pressure 0 mmHg  LV EDP 6 mmHg  AOp Systolic Pressure 782 mmHg  AOp Diastolic Pressure 60 mmHg  AOp Mean Pressure 80 mmHg  LVp Systolic Pressure 956 mmHg  LVp Diastolic Pressure 0 mmHg  LVp EDP Pressure 5 mmHg  QP/QS 1  TPVR Index 13.24 HRUI  TSVR Index 31.44 HRUI  PVR SVR Ratio  0.15  TPVR/TSVR Ratio 0.42       CT ANGIOGRAPHY CHEST, ABDOMEN AND PELVIS  TECHNIQUE: Multidetector CT imaging through the chest, abdomen and pelvis was performed using the standard protocol during bolus administration of intravenous contrast. Multiplanar reconstructed images and MIPs were obtained and reviewed to evaluate the vascular anatomy.  CONTRAST: 57m ISOVUE-370 IOPAMIDOL (ISOVUE-370) INJECTION 76%  COMPARISON: 01/04/2016  FINDINGS: CTA CHEST FINDINGS  Cardiovascular: Normal caliber of the thoracic aorta without dissection. The aortic root at the sinuses of Valsalva measures 3.6 cm. Sinotubular junction measures 3.1 cm. Mid ascending thoracic aorta measures roughly 3.0 cm. Typical three-vessel arch anatomy. The great vessels are patent. Proximal bilateral vertebral arteries are patent. Mild atherosclerotic disease in the thoracic aorta. Small amount of calcified plaque at the origin of the left subclavian artery without significant stenosis. Proximal descending thoracic aorta measures 2.7 cm. Coronary artery calcifications involving the LAD, left circumflex and the right coronary artery. Mitral annular calcifications. Main pulmonary artery is enlarged measuring 3.9 cm and stable. Main pulmonary arteries are patent.  Mediastinum/Nodes: Subcarinal tissue measuring 1.6 cm in the short axis and previously measured 2.1 cm. Small upper mediastinal lymph nodes were poorly characterized on the previous examination due to contrast artifact. Largest lymph node in the upper mediastinum measures 1.0 cm in the short axis. Small supraclavicular lymph nodes bilaterally. Soft tissue in the prevascular region of the mediastinum measures 1.0 cm in the short axis previously measured 0.9 cm. No significant hilar lymphadenopathy.  Lungs/Pleura: Trachea and mainstem bronchi are patent. Pleural-based nodule in the right middle lobe on sequence 20, image 100 measures 9 x 7  mm and previously measured 8 x 7 mm. This nodule has not significantly changed. Focal thickening in the mid right lung on image 63 was probably present on the previous examination. On the prior examination, there was extensive airspace disease in both lungs, particularly on the right side which has resolved. Persistent focal opacity in the anterior right lower lobe on sequence 20, image 76. Patchy pleural-based densities in the bilateral lower lobes along the dependent aspect. Poorly defined opacity along the medial left upper lobe on sequence 21, image 60, measures up to 1.0 cm, and appears to be stable since 2017. Mild scarring at the lung apices. Mild posterior right pleural thickening. No significant pleural effusions.  Musculoskeletal: No acute bone abnormality.  Review of the MIP images confirms the above findings.  CTA ABDOMEN AND PELVIS FINDINGS  VASCULAR  Aorta: Mild atherosclerotic disease in the abdominal aorta without aneurysm or dissection.  Celiac: Patent without evidence of aneurysm, dissection, vasculitis or significant stenosis.  SMA: Patent without evidence of aneurysm, dissection, vasculitis or significant stenosis.  Renals: Both  renal arteries are patent without evidence of aneurysm, dissection, vasculitis, fibromuscular dysplasia or significant stenosis.  IMA: Patent without evidence of aneurysm, dissection, vasculitis or significant stenosis.  Inflow: Patent without evidence of aneurysm, dissection, vasculitis or significant stenosis.  Veins: No obvious venous abnormality within the limitations of this arterial phase study.  Review of the MIP images confirms the above findings.  NON-VASCULAR  Hepatobiliary: Normal appearance of the liver and gallbladder.  Pancreas: Unremarkable. No pancreatic ductal dilatation or surrounding inflammatory changes.  Spleen: Normal in size without focal abnormality.  Adrenals/Urinary Tract:  Question some mild fullness in the right adrenal gland. Normal left adrenal gland. Normal appearance of both kidneys. No suspicious renal lesions. No hydronephrosis. Urinary bladder is decompressed.  Stomach/Bowel: Stomach is within normal limits. No evidence of bowel wall thickening, distention, or inflammatory changes.  Lymphatic: No abdominopelvic lymphadenopathy.  Reproductive: Uterus and bilateral adnexa are unremarkable.  Other: Small umbilical hernia containing fat. Negative for free fluid. Negative for free air.  Musculoskeletal: No acute bone abnormality.  Review of the MIP images confirms the above findings.  IMPRESSION: 1. Negative for an aortic aneurysm or dissection. 2. Mild aortic atherosclerosis. 3. Coronary artery calcifications. 4. Stable enlargement of the main pulmonary artery and may represent underlying pulmonary hypertension. 5. Mild mediastinal lymphadenopathy but no significant change from the previous examination. 6. Stable poorly defined densities and nodules in the lungs. These nodular areas have not significantly changed since 2017 and likely represent benign entities. Markedly improved aeration in the lungs compared to the examination in 2017 but scattered parenchymal densities could represent postinflammatory changes. 7. No acute abnormality in the chest, abdomen or pelvis.   Electronically Signed By: Markus Daft M.D. On: 05/05/2019 17:02    Treatments:  CARDIOTHORACIC SURGERY OPERATIVE NOTE  Date of Procedure:                            05/24/2019  Preoperative Diagnosis:         Rheumatic heart disease  Severe mitral stenosis and moderate mitral regurgitation  Long-standing persistent atrial fibrillation  Postoperative Diagnosis:    Same  Procedure:        Minimally-Invasive Mitral Valve Replacement             Sorin Carbomedics Optiform bileaflet mechanical valve (size 40m, cat #[id number], serial  [id number]   Minimally-Invasive Maze Procedure             Complete bilateral atrial lesion set using cryothermy and bipolar radiofrequency ablation             Clipping of Left Atrial Appendage (Atricure Pro245 left atrial clip, size 45 mm)  Surgeon:        CValentina Gu ORoxy Manns MD  Assistant:       MEnid Cutter PA-C  Anesthesia:    DRoberts Gaudy MD  Operative Findings: ? Rheumatic mitral valve disease ? Severe mitral stenosis and moderate mitral regurgitation ? Normal left ventricular systolic function ? Moderate-severe pulmonary hypertension ? Normal right ventricular function ? Trace tricuspid regurgitation              BRIEF CLINICAL NOTE AND INDICATIONS FOR SURGERY  Patient is a 63year old female referred for surgical consultation to discuss treatment options for rheumatic heart disease with combination of severe mitral stenosis, mitral regurgitation, chronic diastolic congestive heart failure, andatrial fibrillation.  Patient states that she became ill at age [age]and has a heart murmur ever since. She was  later diagnosed with rheumatic heart disease. She has been followed intermittently for many years.In 2008 she first developed paroxysmal atrial fibrillation. She was found to have mitral stenosis and mitral regurgitation. She has been chronically anticoagulated using warfarin ever since. For the last several years she has been followed by Dr. Agustin Torres.Echocardiograms have documented the presence of normal left ventricular systolic function with severe mitral stenosis and mitral regurgitation. Over the past 6 to 12 months the patient has developed decreased exercise tolerance with increasing exertional shortness of breath and fatigue. Transthoracic echocardiogram performed September 14, 2018 revealed normal left ventricular systolic function with severe rheumatic mitral valve disease including severe mitral stenosis and moderate mitral  regurgitation. There was severe left atrial enlargement and evidence of pulmonary hypertension. There was mild tricuspid regurgitation. The patient was referred to Dr. Mina Torres at [facility] to consider balloon mitral valvuloplasty. Transesophageal echocardiogram was recommended and performed on January 06, 2019. This confirmed the presence of severe rheumatic mitral valve disease with severe thickening and calcification of the mitral valve leaflets.There was severe mitral stenosis and severe mitral regurgitation. There was moderate tricuspid regurgitation. Left ventricular systolic function remain normal. The aortic valve was normal. The patient's mitral valve was felt not suitable for balloon mitral valvuloplasty. Referral for possible mitral valve replacement was delayed because of the COVID-19 pandemic. Eventually the patient underwent left and right heart catheterization on April 07, 2019. The patient was found to have mild nonobstructive coronary artery disease. There was mild to moderate pulmonary hypertension and normal left ventricular end-diastolic pressure. Cardiothoracic surgical consultation was requested.   Discharge Exam: Blood pressure 120/62, pulse 81, temperature 98.6 F (37 C), temperature source Oral, resp. rate (!) 26, height 5' 3"  (1.6 m), weight 61.9 kg, SpO2 93 %.  General appearance: alert, cooperative and no distress Heart: regular rate and rhythm Lungs: clear to auscultation bilaterally Abdomen: soft, non-tender; bowel sounds normal; no masses,  no organomegaly Extremities: edema trace Wound: clean and dry  Disposition: Stable and discharged to home.   Allergies as of 05/30/2019      Reactions   Adhesive [tape] Other (See Comments)   Skin irritation   Penicillins Rash, Other (See Comments)   Did it involve swelling of the face/tongue/throat, SOB, or low BP? No Did it involve sudden or severe rash/hives, skin peeling, or any reaction on the  inside of your mouth or nose? No Did you need to seek medical attention at a hospital or doctor's office? No When did it last happen?childhood allergy If all above answers are "NO", may proceed with cephalosporin use.      Medication List    STOP taking these medications   digoxin 0.25 MG tablet Commonly known as: Digox   enoxaparin 100 MG/ML injection Commonly known as: LOVENOX     TAKE these medications   aspirin 81 MG EC tablet Take 1 tablet (81 mg total) by mouth daily.   CHROMEMATE PO Take 600 mg by mouth daily.   clobetasol cream 0.05 % Commonly known as: TEMOVATE Apply 1 application topically 2 (two) times daily as needed (for lichen sclerosus).   furosemide 40 MG tablet Commonly known as: LASIX Take 1 tablet (40 mg total) by mouth daily.   Januvia 100 MG tablet Generic drug: sitaGLIPtin Take 100 mg by mouth every evening.   loratadine 10 MG tablet Commonly known as: CLARITIN Take 10 mg by mouth daily.   Multaq 400 MG tablet Generic drug: dronedarone TAKE 1 TABLET BY MOUTH TWICE (  2) DAILY What changed: See the new instructions.   omeprazole 20 MG capsule Commonly known as: PRILOSEC Take 20 mg by mouth daily as needed (for acid reflux or heartburn).   potassium chloride SA 20 MEQ tablet Commonly known as: K-DUR Take 1 tablet (20 mEq total) by mouth daily.   pravastatin 40 MG tablet Commonly known as: PRAVACHOL TAKE 1 TABLET BY MOUTH DAILY WITH SUPPER. *NEED APPOINTMENT FOR FURTHER REFILLS* What changed: See the new instructions.   traMADol 50 MG tablet Commonly known as: ULTRAM Take 1-2 tablets (50-100 mg total) by mouth every 4 (four) hours as needed for moderate pain.   warfarin 5 MG tablet Commonly known as: COUMADIN Take as directed. If you are unsure how to take this medication, talk to your nurse or doctor. Original instructions: Take As Directed by [facility]mad[facility] Clinic What changed:   how much to take  how to take this  when to take  this  additional instructions      Follow-up Information    Park Liter, MD Follow up on 06/15/2019.   Specialty: Cardiology Why: Please arrive 15 minutes early for your 8:40am post-hospital follow-up appointment. This visit is in [facility] as Dr. Elmon Kirschner did not have any openings in [place] until Sept.  Contact information: [address] [place] [address] [phone]        [facility]. Go on 06/06/2019.   Specialty: Cardiothoracic Surgery Why: You have an appointment for suture removal at Dr. Guy Sandifer office on Tuesday 06/06/19 at [place] information: [place], [place] [facility] at [place]. Go on 06/06/2019.   Specialty: Cardiology Why: You have an appointment at the coumadin Clinic in [place] on Tuesday 06/06/19 at 1pm. Contact information: [place] [address] [phone]       Rexene Alberts, MD. Go on 06/26/2019.   Specialty: Cardiothoracic Surgery Why: You have an appointment with Dr. Roxy Manns on Monday 06/26/2019 at 2:30.  Please plan to arrive at 2:00pm for a chest x-ray to be done at [facility] located on the first floor of the same building. Contact information: [place] [address] [phone]          The patient has been discharged on:   1.Beta Blocker:  Yes [   ]                              No   [x   ]                              If No, reason: on Multaq  2.Ace Inhibitor/ARB: Yes [   ]                                     No  [ x   ]                                     If No, reason: labile BP  3.Statin:   Yes [ Y  ]  No  [   ]                  If No, reason:  4.Ecasa:  Yes  [ x  ]                  No   [   ]                  If No, reason:   Signed: Eyleen Rawlinson, PA-C 05/30/2019, 7:51 AM

## 2019-05-25 NOTE — Progress Notes (Signed)
Patient ID: Melody Torres, female   DOB: May 03, 1956, 63 y.o.   MRN: [id number] EVENING ROUNDS NOTE :     [address]       [place] [address]             [phone]                 1 Day Post-Op Procedure(s) (LRB): MINIMALLY INVASIVE MITRAL VALVE (MV) REPLACEMENT Using Carbomedics Optiform Heart Valve Size 82mm (Right) MINIMALLY INVASIVE MAZE PROCEDURE (N/A) TRANSESOPHAGEAL ECHOCARDIOGRAM (TEE) (N/A) Clipping Of Atrial Appendage Using AtriCure PRO2 clip size 97mm (N/A)  Total Length of Stay:  LOS: 1 day  BP 108/68 (BP Location: Right Arm)   Pulse 80   Temp 98.5 F (36.9 C) (Oral)   Resp (!) 23   Wt 70.5 kg   LMP  (LMP Unknown)   SpO2 97%   BMI 26.68 kg/m   .Intake/Output      07/29 0701 - 07/30 0700 07/30 0701 - 07/31 0700   I.V. (mL/kg) 2759.2 (39.1) 112.7 (1.6)   Blood 200    IV Piggyback 1034.7 100   Total Intake(mL/kg) 3993.9 (56.7) 212.7 (3)   Urine (mL/kg/hr) 3420 (2) 745 (0.9)   Blood 400    Chest Tube 790 560   Total Output 4610 1305   Net -616.1 -1092.3          . sodium chloride    . cefUROXime (ZINACEF)  IV Stopped (05/25/19 0956)  . lactated ringers    . lactated ringers 10 mL/hr at 05/25/19 1800     Lab Results  Component Value Date   WBC 18.8 (H) 05/25/2019   HGB 11.0 (L) 05/25/2019   HCT 34.6 (L) 05/25/2019   PLT 106 (L) 05/25/2019   GLUCOSE 210 (H) 05/25/2019   CHOL 140 04/29/2017   TRIG 232 (H) 04/29/2017   HDL 37 (L) 04/29/2017   LDLCALC 57 04/29/2017   ALT 35 05/24/2019   AST 26 05/24/2019   NA 135 05/25/2019   K 4.7 05/25/2019   CL 105 05/25/2019   CREATININE 0.99 05/25/2019   BUN 15 05/25/2019   CO2 24 05/25/2019   INR 1.5 (H) 05/24/2019   HGBA1C 6.5 (H) 05/22/2019    Walking with help Foley and chest tubes in place  Grace Isaac MD  Beeper [phone] Office [phone] 05/25/2019 6:45 PM

## 2019-05-25 NOTE — Progress Notes (Addendum)
[facility] DAILY ICU PROGRESS NOTE                   [employer]            [place] [address]          [phone]   1 Day Post-Op Procedure(s) (LRB): MINIMALLY INVASIVE MITRAL VALVE (MV) REPLACEMENT Using Carbomedics Optiform Heart Valve Size 30mm (Right) MINIMALLY INVASIVE MAZE PROCEDURE (N/A) TRANSESOPHAGEAL ECHOCARDIOGRAM (TEE) (N/A) Clipping Of Atrial Appendage Using AtriCure PRO2 clip size 87mm (N/A)  Total Length of Stay:  LOS: 1 day   Subjective: Extubated ~7pm yesterday.  Awake and alert, neuro intact.  Pain control adequate.Currently on milrinone and epinephrine infusions. CI 2.5  Objective: Vital signs in last 24 hours: Temp:  [96.4 F (35.8 C)-98.6 F (37 C)] 98.1 F (36.7 C) (07/30 0700) Pulse Rate:  [78-86] 86 (07/30 0700) Cardiac Rhythm: Atrial paced (07/30 0400) Resp:  [9-26] 19 (07/30 0700) BP: (82-113)/(54-80) 107/62 (07/30 0700) SpO2:  [88 %-100 %] 99 % (07/30 0700) Arterial Line BP: (95-149)/(45-74) 121/51 (07/30 0700) FiO2 (%):  [40 %-100 %] 40 % (07/29 1820) Weight:  [70.5 kg] 70.5 kg (07/30 0500)  Filed Weights   05/25/19 0500  Weight: 70.5 kg    Weight change:    Hemodynamic parameters for last 24 hours: PAP: (32-51)/(13-25) 42/18 CO:  [2.3 L/min-4.2 L/min] 4 L/min CI:  [1.4 L/min/m2-2.6 L/min/m2] 2.6 L/min/m2  Intake/Output from previous day: 07/29 0701 - 07/30 0700 In: 3993.9 [I.V.:2759.2; Blood:200; IV Piggyback:1034.7] Out: 4610 [Urine:3420; Blood:400; Chest Tube:790]  Intake/Output this shift: No intake/output data recorded.  Current Meds: Scheduled Meds: . acetaminophen  1,000 mg Oral Q6H  . aspirin EC  325 mg Oral Daily  . bisacodyl  10 mg Oral Daily   Or  . bisacodyl  10 mg Rectal Daily  . docusate sodium  200 mg Oral Daily  . insulin regular  0-10 Units Intravenous TID WC  . [START ON 05/26/2019] pantoprazole  40 mg Oral Daily  . sodium chloride flush  3 mL Intravenous Q12H   Continuous Infusions: .  sodium chloride    . albumin human 12.5 g (05/24/19 1434)  . cefUROXime (ZINACEF)  IV Stopped (05/24/19 2204)  . insulin Stopped (05/25/19 0516)  . lactated ringers    . lactated ringers 10 mL/hr at 05/25/19 0700  . milrinone 0.3 mcg/kg/min (05/25/19 0700)  . phenylephrine (NEO-SYNEPHRINE) Adult infusion 5 mcg/min (05/25/19 0700)   PRN Meds:.albumin human, metoprolol tartrate, morphine injection, ondansetron (ZOFRAN) IV, oxyCODONE, sodium chloride flush, traMADol  General appearance: alert, cooperative and no distress Neurologic: intact Heart: intrinsic JR at 55-60. A paced at 80. Lungs: Breath sounds clear. Moderated serous CT drainarg that is tapering off.CXR reviewed: significant ATX right lower luung zone.   Abdomen: Soft, NT.  Extremities: All well perfused, palpable DP pulses bilaterally, right femoral vascular access sites soft and dry.  Wound: The tight chest incision is covered with a dry Aquacel dressing.   Lab Results: CBC: Recent Labs    05/24/19 1952  05/25/19 0411 05/25/19 0633  WBC 14.2*  --  16.6*  --   HGB 10.7*   < > 9.9* 10.2*  HCT 33.3*   < > 31.3* 30.0*  PLT 108*  --  104*  --    < > = values in this interval not displayed.   BMET:  Recent Labs    05/24/19 1952  05/25/19 0411 05/25/19 7209  NA 140   < >  137 139  K 4.9   < > 4.9 5.0  CL 111  --  110  --   CO2 22  --  22  --   GLUCOSE 139*  --  114*  --   BUN 14  --  13  --   CREATININE 0.93  --  0.83  --   CALCIUM 8.0*  --  8.0*  --    < > = values in this interval not displayed.    CMET: Lab Results  Component Value Date   WBC 16.6 (H) 05/25/2019   HGB 10.2 (L) 05/25/2019   HCT 30.0 (L) 05/25/2019   PLT 104 (L) 05/25/2019   GLUCOSE 114 (H) 05/25/2019   CHOL 140 04/29/2017   TRIG 232 (H) 04/29/2017   HDL 37 (L) 04/29/2017   LDLCALC 57 04/29/2017   ALT 35 05/24/2019   AST 26 05/24/2019   NA 139 05/25/2019   K 5.0 05/25/2019   CL 110 05/25/2019   CREATININE 0.83 05/25/2019   BUN 13  05/25/2019   CO2 22 05/25/2019   INR 1.5 (H) 05/24/2019   HGBA1C 6.5 (H) 05/22/2019      PT/INR:  Recent Labs    05/24/19 1400  LABPROT 17.9*  INR 1.5*   Radiology: Dg Chest Port 1 View  Result Date: 05/24/2019 CLINICAL DATA:  Severe mitral stenosis. Status post mitral valve replacement. EXAM: PORTABLE CHEST 1 VIEW COMPARISON:  05/22/2019 FINDINGS: Endotracheal tube tip is 5.4 cm above the carina. Swan-Ganz catheter tip is in the main pulmonary artery. Right chest tube appears in good position with no pneumothorax. Sheath in the left jugular vein with the tip near the junction with the left innominate vein. A chest tube lies across the heart from right to left, possibly in the pericardium. There is peripheral haziness in the right midzone and at the right lung base consistent pulmonary edema, probably due to reinflation. Right lung is clear. Mitral valve prosthesis in place. Clip on the left atrial appendage. IMPRESSION: 1. Tubes and lines in good position. 2. No pneumothorax. 3.  pulmonary edema on the right. Electronically Signed   By: Francene BoyersJames  Maxwell M.D.   On: 05/24/2019 14:21     Assessment/Plan: S/P Procedure(s) (LRB): MINIMALLY INVASIVE MITRAL VALVE (MV) REPLACEMENT Using Carbomedics Optiform Heart Valve Size 33mm (Right) MINIMALLY INVASIVE MAZE PROCEDURE (N/A) TRANSESOPHAGEAL ECHOCARDIOGRAM (TEE) (N/A) Clipping Of Atrial Appendage Using AtriCure PRO2 clip size 45mm (N/A)  -POD-1 mini mitral valve replacement with a 33 Carbomedics mechanical valve for rheumatic MR/MS and MAZE for chronic PAF.She has been hemodynamically stable since surgery. Will wean milrinone and epinephrine and remove PA catheter, A-line.  Leave CT's for drainage. Mobilize. Resume anticoagulation with Coumadin.   -Expected post-op respiratory insufficiency with RLL ATX. Encouraging IS, pulmonary hygiene.   -Expected acute blood loss anemia and thrombocytopenia- Mild with minimal recurring losses. No  indication for transfusions. Monitor.  -Endo-History of type 2 DM with HgbA1C 6.5 pre-op. Insulin drip off, monitor glucometers and continue SSI for now.  -DVT PPX- Contiue SCD's for now and add enoxaparin tomorrow.   Leary RocaMyron G. Roddenberry, PA-C [phone] 05/25/2019 7:34 AM  I have seen and examined the patient and agree with the assessment and plan as outlined.  Doing well POD1.  Maintaining AAI paced rhythm w/ slow sinus and junctional underneath, HR 50's.  Hemodynamics stable on low dose milrinone and neo drips.  Breathing comfortably w/ O2 sats 99% on 4 L/min and CXR looks okay although some R  lung opacity c/w atelectasis and some ALI.  Mobilize.  Wean drips.  D/C Swan-Ganz and Aline.  Start Coumadin.  Hold beta blockers and continue AAI pacing.  Purcell Nailslarence H Edrie Ehrich, MD 05/25/2019 8:36 AM

## 2019-05-25 NOTE — Anesthesia Postprocedure Evaluation (Signed)
Anesthesia Post Note  Patient: Melody Torres  Procedure(s) Performed: MINIMALLY INVASIVE MITRAL VALVE (MV) REPLACEMENT Using Carbomedics Optiform Heart Valve Size 33mm (Right Chest) MINIMALLY INVASIVE MAZE PROCEDURE (N/A ) TRANSESOPHAGEAL ECHOCARDIOGRAM (TEE) (N/A ) Clipping Of Atrial Appendage Using AtriCure PRO2 clip size 44mm (N/A Chest)     Patient location during evaluation: SICU Anesthesia Type: General Level of consciousness: awake and alert Pain management: pain level controlled Vital Signs Assessment: post-procedure vital signs reviewed and stable Respiratory status: spontaneous breathing, respiratory function stable and patient connected to nasal cannula oxygen Cardiovascular status: blood pressure returned to baseline Postop Assessment: no headache Anesthetic complications: no    Last Vitals:  Vitals:   05/25/19 1600 05/25/19 1700  BP: 108/68   Pulse: 81 80  Resp: (!) 23 (!) 23  Temp:    SpO2: 98% 97%    Last Pain:  Vitals:   05/25/19 1645  TempSrc:   PainSc: 5                  Aldon Hengst COKER

## 2019-05-26 ENCOUNTER — Inpatient Hospital Stay (HOSPITAL_COMMUNITY): Payer: BC Managed Care – PPO

## 2019-05-26 LAB — GLUCOSE, CAPILLARY
Glucose-Capillary: 127 mg/dL — ABNORMAL HIGH (ref 70–99)
Glucose-Capillary: 131 mg/dL — ABNORMAL HIGH (ref 70–99)
Glucose-Capillary: 165 mg/dL — ABNORMAL HIGH (ref 70–99)
Glucose-Capillary: 169 mg/dL — ABNORMAL HIGH (ref 70–99)
Glucose-Capillary: 138 mg/dL — ABNORMAL HIGH (ref 70–99)

## 2019-05-26 LAB — BASIC METABOLIC PANEL
Anion gap: 8 (ref 5–15)
BUN: 20 mg/dL (ref 8–23)
CO2: 24 mmol/L (ref 22–32)
Calcium: 8.4 mg/dL — ABNORMAL LOW (ref 8.9–10.3)
Chloride: 103 mmol/L (ref 98–111)
Creatinine, Ser: 1.11 mg/dL — ABNORMAL HIGH (ref 0.44–1.00)
GFR calc Af Amer: >60 mL/min (ref >60)
GFR calc non Af Amer: 52 mL/min — ABNORMAL LOW (ref >60)
Glucose, Bld: 150 mg/dL — ABNORMAL HIGH (ref 70–99)
Potassium: 4.4 mmol/L (ref 3.5–5.1)
Sodium: 135 mmol/L (ref 135–145)

## 2019-05-26 LAB — CBC
HCT: 36.4 % (ref 36.0–46.0)
Hemoglobin: 11.8 g/dL — ABNORMAL LOW (ref 12.0–15.0)
MCH: 29.6 pg (ref 26.0–34.0)
MCHC: 32.4 g/dL (ref 30.0–36.0)
MCV: 91.5 fL (ref 80.0–100.0)
Platelets: 107 10*3/uL — ABNORMAL LOW (ref 150–400)
RBC: 3.98 MIL/uL (ref 3.87–5.11)
RDW: 13.9 % (ref 11.5–15.5)
WBC: 18 10*3/uL — ABNORMAL HIGH (ref 4.0–10.5)
nRBC: 0 % (ref 0.0–0.2)

## 2019-05-26 LAB — PROTIME-INR
INR: 1.3 — ABNORMAL HIGH (ref 0.8–1.2)
Prothrombin Time: 16.3 seconds — ABNORMAL HIGH (ref 11.4–15.2)

## 2019-05-26 MED ORDER — DRONEDARONE HCL 400 MG PO TABS
400.0000 mg | ORAL_TABLET | Freq: Two times a day (BID) | ORAL | Status: DC
  Administered 2019-05-26 – 2019-05-30 (×9): 400 mg via ORAL
  Filled 2019-05-26 (×10): qty 1

## 2019-05-26 MED ORDER — FUROSEMIDE 10 MG/ML IJ SOLN
20.0000 mg | Freq: Four times a day (QID) | INTRAMUSCULAR | Status: AC
  Administered 2019-05-26 – 2019-05-27 (×3): 20 mg via INTRAVENOUS
  Filled 2019-05-26 (×3): qty 2

## 2019-05-26 MED ORDER — INSULIN ASPART 100 UNIT/ML ~~LOC~~ SOLN
0.0000 [IU] | Freq: Three times a day (TID) | SUBCUTANEOUS | Status: DC
  Administered 2019-05-26: 3 [IU] via SUBCUTANEOUS
  Administered 2019-05-26: 2 [IU] via SUBCUTANEOUS
  Administered 2019-05-26 – 2019-05-29 (×3): 3 [IU] via SUBCUTANEOUS

## 2019-05-26 MED ORDER — MOVING RIGHT ALONG BOOK
Freq: Once | Status: DC
  Filled 2019-05-26: qty 1

## 2019-05-26 MED FILL — Sodium Bicarbonate IV Soln 8.4%: INTRAVENOUS | Qty: 50 | Status: AC

## 2019-05-26 MED FILL — Potassium Chloride Inj 2 mEq/ML: INTRAVENOUS | Qty: 40 | Status: AC

## 2019-05-26 MED FILL — Electrolyte-R (PH 7.4) Solution: INTRAVENOUS | Qty: 4000 | Status: AC

## 2019-05-26 MED FILL — Heparin Sodium (Porcine) Inj 1000 Unit/ML: INTRAMUSCULAR | Qty: 30 | Status: AC

## 2019-05-26 MED FILL — Albumin, Human Inj 5%: INTRAVENOUS | Qty: 250 | Status: AC

## 2019-05-26 MED FILL — Lidocaine HCl Local Preservative Free (PF) Inj 2%: INTRAMUSCULAR | Qty: 15 | Status: AC

## 2019-05-26 MED FILL — Mannitol IV Soln 20%: INTRAVENOUS | Qty: 500 | Status: AC

## 2019-05-26 MED FILL — Sodium Chloride IV Soln 0.9%: INTRAVENOUS | Qty: 3000 | Status: AC

## 2019-05-26 MED FILL — Heparin Sodium (Porcine) Inj 1000 Unit/ML: INTRAMUSCULAR | Qty: 10 | Status: AC

## 2019-05-26 NOTE — Progress Notes (Addendum)
[address]       [place] [id number]             [phone]        CARDIOTHORACIC SURGERY PROGRESS NOTE   R2 Days Post-Op Procedure(s) (LRB): MINIMALLY INVASIVE MITRAL VALVE (MV) REPLACEMENT Using Carbomedics Optiform Heart Valve Size 33mm (Right) MINIMALLY INVASIVE MAZE PROCEDURE (N/A) TRANSESOPHAGEAL ECHOCARDIOGRAM (TEE) (N/A) Clipping Of Atrial Appendage Using AtriCure PRO2 clip size 45mm (N/A)  Subjective: Reports feeling better.  Ambulated w/ assistance but somewhat weak.  Mild hoarseness of voice.  Minimal pain.  No SOB  Objective: Vital signs: BP Readings from Last 1 Encounters:  05/26/19 93/64   Pulse Readings from Last 1 Encounters:  05/26/19 76   Resp Readings from Last 1 Encounters:  05/25/19 (!) 22   Temp Readings from Last 1 Encounters:  05/26/19 98.5 F (36.9 C) (Oral)    Hemodynamics: PAP: (41)/(16) 41/16  Physical Exam:  Rhythm:   sinus  Breath sounds: clear  Heart sounds:  RRR w/ mechanical sounds  Incisions:  Dressings dry, intact  Abdomen:  Soft, non-distended, non-tender  Extremities:  Warm, well-perfused  Chest tubes:  Decreasing but significant volume thin serosanguinous output, no air leak    Intake/Output from previous day: 07/30 0701 - 07/31 0700 In: 382.5 [I.V.:182.4; IV Piggyback:200.1] Out: 2395 [Urine:1495; Chest Tube:900] Intake/Output this shift: No intake/output data recorded.  Lab Results:  CBC: Recent Labs    05/25/19 1630 05/26/19 0632  WBC 18.8* 18.0*  HGB 11.0* 11.8*  HCT 34.6* 36.4  PLT 106* 107*    BMET:  Recent Labs    05/25/19 0411 05/25/19 0633 05/25/19 1630  NA 137 139 135  K 4.9 5.0 4.7  CL 110  --  105  CO2 22  --  24  GLUCOSE 114*  --  210*  BUN 13  --  15  CREATININE 0.83  --  0.99  CALCIUM 8.0*  --  8.2*     PT/INR:   Recent Labs    05/24/19 1400  LABPROT 17.9*  INR 1.5*    CBG (last 3)  Recent Labs    05/25/19 2344 05/26/19 0347 05/26/19 0720   GLUCAP 122* 127* 131*    ABG    Component Value Date/Time   PHART 7.370 05/25/2019 0633   PCO2ART 37.3 05/25/2019 0633   PO2ART 91.0 05/25/2019 0633   HCO3 21.7 05/25/2019 0633   TCO2 23 05/25/2019 0633   ACIDBASEDEF 3.0 (H) 05/25/2019 0633   O2SAT 97.0 05/25/2019 0633    CXR: PORTABLE CHEST 1 VIEW  COMPARISON:  05/25/2019.  FINDINGS: Interval removal of right IJ sheath. Right chest tube in stable position. Tiny right apical pneumothorax cannot be excluded on today's exam. Prior cardiac valve replacement. Left atrial appendage clip. Stable cardiomegaly. Persistent atelectatic changes and/or infiltrate right lung with interim improvement in aeration from prior exam. Small right pleural effusion again noted.  IMPRESSION: 1. Interim removal of right IJ sheath. Right chest tube in stable position. Tiny right apical pneumothorax cannot be excluded on today's exam.  2. Prior cardiac valve replacement. Left atrial appendage clip. Stable cardiomegaly.  3. Persistent atelectatic changes and/or infiltrate right lung with interim improved aeration right lung from prior exam. Small right pleural effusion again noted.  These results will be called to the ordering clinician or representative by the Radiologist Assistant, and communication documented in the PACS or zVision Dashboard.   Electronically Signed   By: Maisie Fushomas  Register   On: 05/26/2019 07:04   Assessment/Plan: S/P Procedure(s) (LRB): MINIMALLY INVASIVE MITRAL VALVE (MV) REPLACEMENT Using Carbomedics Optiform Heart Valve Size 65mm (Right) MINIMALLY INVASIVE MAZE PROCEDURE (N/A) TRANSESOPHAGEAL ECHOCARDIOGRAM (TEE) (N/A) Clipping Of Atrial Appendage Using AtriCure PRO2 clip size 62mm (N/A)  Overall doing well POD2 Maintaining NSR w/ stable BP Breathing comfortably w/ O2 sats 95-97% on 1 L/min, CXR improved Expected post op acute blood loss anemia, mild, stable Acute on chronic diastolic CHF with  expected post-op volume excess, I/O's negative 2 L yesterday and weight down 1.2 kg but still 6 kg > preop Leukocytosis w/out fever, likely reactive, stable Post op thrombocytopenia, mild, stable Type II diabetes mellitus, adequate glycemic control   Mobilize  Diuresis  Leave tubes in until output decreases  Restart Multaq  Coumadin  Change CBG's and SSI to ac/hs, restart Januvia once oral intake improved  Transfer 4E     Rexene Alberts, MD 05/26/2019 7:31 AM

## 2019-05-26 NOTE — Discharge Instructions (Addendum)
Discharge Instructions:  1. You may shower, please wash incisions daily with soap and water and keep dry.  If you wish to cover wounds with dressing you may do so but please keep clean and change daily.  No tub baths or swimming until incisions have completely healed.  If your incisions become red or develop any drainage please call our office at [phone]  2. No Driving until cleared by Dr. Owen's office and you are no longer using narcotic pain medications  3. Monitor your weight daily.. Please use the same scale and weigh at same time... If you gain 5-10 lbs in 48 hours with associated lower extremity swelling, please contact our office at [phone]  4. Fever of 101.5 for at least 24 hours with no source, please contact our office at [phone]  5. Activity- up as tolerated, please walk at least 3 times per day.  Avoid strenuous activity, no lifting, pushing, or pulling with your arms over 8-10 lbs for a minimum of 6 weeks  6. If any questions or concerns arise, please do not hesitate to contact our office at [phone] Information on my medicine - Coumadin   (Warfarin)   Why was Coumadin prescribed for you? Coumadin was prescribed for you because you have a blood clot or a medical condition that can cause an increased risk of forming blood clots. Blood clots can cause serious health problems by blocking the flow of blood to the heart, lung, or brain. Coumadin can prevent harmful blood clots from forming. As a reminder your indication for Coumadin is:   Blood Clot Prevention After Heart Valve Surgery  What test will check on my response to Coumadin? While on Coumadin (warfarin) you will need to have an INR test regularly to ensure that your dose is keeping you in the desired range. The INR (international normalized ratio) number is calculated from the result of the laboratory test called prothrombin time (PT).  If an INR APPOINTMENT HAS NOT ALREADY BEEN MADE FOR YOU please  schedule an appointment to have this lab work done by your health care provider within 7 days. Your INR goal is usually a number between:  2 to 3 or your provider may give you a more narrow range like 2-2.5.  Ask your health care provider during an office visit what your goal INR is.  What  do you need to  know  About  COUMADIN? Take Coumadin (warfarin) exactly as prescribed by your healthcare provider about the same time each day.  DO NOT stop taking without talking to the doctor who prescribed the medication.  Stopping without other blood clot prevention medication to take the place of Coumadin may increase your risk of developing a new clot or stroke.  Get refills before you run out.  What do you do if you miss a dose? If you miss a dose, take it as soon as you remember on the same day then continue your regularly scheduled regimen the next day.  Do not take two doses of Coumadin at the same time.  Important Safety Information A possible side effect of Coumadin (Warfarin) is an increased risk of bleeding. You should call your healthcare provider right away if you experience any of the following: ? Bleeding from an injury or your nose that does not stop. ? Unusual colored urine (red or dark brown) or unusual colored stools (red or black). ? Unusual bruising for unknown reasons. ? A serious fall or if you hit your head (even if   there is no bleeding).  Some foods or medicines interact with Coumadin (warfarin) and might alter your response to warfarin. To help avoid this: ? Eat a balanced diet, maintaining a consistent amount of Vitamin K. ? Notify your provider about major diet changes you plan to make. ? Avoid alcohol or limit your intake to 1 drink for women and 2 drinks for men per day. (1 drink is 5 oz. wine, 12 oz. beer, or 1.5 oz. liquor.)  Make sure that ANY health care provider who prescribes medication for you knows that you are taking Coumadin (warfarin).  Also make sure the  healthcare provider who is monitoring your Coumadin knows when you have started a new medication including herbals and non-prescription products.  Coumadin (Warfarin)  Major Drug Interactions  Increased Warfarin Effect Decreased Warfarin Effect  Alcohol (large quantities) Antibiotics (esp. Septra/Bactrim, Flagyl, Cipro) Amiodarone (Cordarone) Aspirin (ASA) Cimetidine (Tagamet) Megestrol (Megace) NSAIDs (ibuprofen, naproxen, etc.) Piroxicam (Feldene) Propafenone (Rythmol SR) Propranolol (Inderal) Isoniazid (INH) Posaconazole (Noxafil) Barbiturates (Phenobarbital) Carbamazepine (Tegretol) Chlordiazepoxide (Librium) Cholestyramine (Questran) Griseofulvin Oral Contraceptives Rifampin Sucralfate (Carafate) Vitamin K   Coumadin (Warfarin) Major Herbal Interactions  Increased Warfarin Effect Decreased Warfarin Effect  Garlic Ginseng Ginkgo biloba Coenzyme Q10 Green tea St. John's wort    Coumadin (Warfarin) FOOD Interactions  Eat a consistent number of servings per week of foods HIGH in Vitamin K (1 serving =  cup)  Collards (cooked, or boiled & drained) Kale (cooked, or boiled & drained) Mustard greens (cooked, or boiled & drained) Parsley *serving size only =  cup Spinach (cooked, or boiled & drained) Swiss chard (cooked, or boiled & drained) Turnip greens (cooked, or boiled & drained)  Eat a consistent number of servings per week of foods MEDIUM-HIGH in Vitamin K (1 serving = 1 cup)  Asparagus (cooked, or boiled & drained) Broccoli (cooked, boiled & drained, or raw & chopped) Brussel sprouts (cooked, or boiled & drained) *serving size only =  cup Lettuce, raw (green leaf, endive, romaine) Spinach, raw Turnip greens, raw & chopped   These websites have more information on Coumadin (warfarin):  www.coumadin.com; www.ahrq.gov/consumer/coumadin.htm;    

## 2019-05-27 LAB — CBC
HCT: 32.5 % — ABNORMAL LOW (ref 36.0–46.0)
Hemoglobin: 10.7 g/dL — ABNORMAL LOW (ref 12.0–15.0)
MCH: 29.6 pg (ref 26.0–34.0)
MCHC: 32.9 g/dL (ref 30.0–36.0)
MCV: 89.8 fL (ref 80.0–100.0)
Platelets: 106 10*3/uL — ABNORMAL LOW (ref 150–400)
RBC: 3.62 MIL/uL — ABNORMAL LOW (ref 3.87–5.11)
RDW: 13.6 % (ref 11.5–15.5)
WBC: 15 10*3/uL — ABNORMAL HIGH (ref 4.0–10.5)
nRBC: 0 % (ref 0.0–0.2)

## 2019-05-27 LAB — BASIC METABOLIC PANEL
Anion gap: 8 (ref 5–15)
BUN: 22 mg/dL (ref 8–23)
CO2: 25 mmol/L (ref 22–32)
Calcium: 8.3 mg/dL — ABNORMAL LOW (ref 8.9–10.3)
Chloride: 100 mmol/L (ref 98–111)
Creatinine, Ser: 1 mg/dL (ref 0.44–1.00)
GFR calc Af Amer: >60 mL/min (ref >60)
GFR calc non Af Amer: 59 mL/min — ABNORMAL LOW (ref >60)
Glucose, Bld: 137 mg/dL — ABNORMAL HIGH (ref 70–99)
Potassium: 4.1 mmol/L (ref 3.5–5.1)
Sodium: 133 mmol/L — ABNORMAL LOW (ref 135–145)

## 2019-05-27 LAB — PROTIME-INR
INR: 1.3 — ABNORMAL HIGH (ref 0.8–1.2)
Prothrombin Time: 16.3 seconds — ABNORMAL HIGH (ref 11.4–15.2)

## 2019-05-27 LAB — GLUCOSE, CAPILLARY
Glucose-Capillary: 120 mg/dL — ABNORMAL HIGH (ref 70–99)
Glucose-Capillary: 161 mg/dL — ABNORMAL HIGH (ref 70–99)
Glucose-Capillary: 128 mg/dL — ABNORMAL HIGH (ref 70–99)
Glucose-Capillary: 200 mg/dL — ABNORMAL HIGH (ref 70–99)

## 2019-05-27 MED ORDER — PRAVASTATIN SODIUM 40 MG PO TABS
40.0000 mg | ORAL_TABLET | Freq: Every day | ORAL | Status: DC
  Administered 2019-05-27 – 2019-05-29 (×3): 40 mg via ORAL
  Filled 2019-05-27 (×3): qty 1

## 2019-05-27 MED ORDER — POTASSIUM CHLORIDE CRYS ER 20 MEQ PO TBCR
20.0000 meq | EXTENDED_RELEASE_TABLET | Freq: Two times a day (BID) | ORAL | Status: DC
  Administered 2019-05-27 – 2019-05-29 (×6): 20 meq via ORAL
  Filled 2019-05-27 (×7): qty 1

## 2019-05-27 MED ORDER — FUROSEMIDE 40 MG PO TABS
40.0000 mg | ORAL_TABLET | Freq: Two times a day (BID) | ORAL | Status: DC
  Administered 2019-05-27 – 2019-05-28 (×4): 40 mg via ORAL
  Filled 2019-05-27 (×4): qty 1

## 2019-05-27 MED ORDER — FUROSEMIDE 40 MG PO TABS: 40.0000 mg | ORAL_TABLET | Freq: Every day | ORAL | Status: DC

## 2019-05-27 MED ORDER — POTASSIUM CHLORIDE CRYS ER 20 MEQ PO TBCR: 20.0000 meq | EXTENDED_RELEASE_TABLET | Freq: Every day | ORAL | Status: DC

## 2019-05-27 MED ORDER — WARFARIN SODIUM 5 MG PO TABS
5.0000 mg | ORAL_TABLET | Freq: Every day | ORAL | Status: DC
  Administered 2019-05-27 – 2019-05-28 (×2): 5 mg via ORAL
  Filled 2019-05-27 (×2): qty 1

## 2019-05-27 NOTE — Progress Notes (Signed)
[id number] Cardiac Rehab Pt just returned from walk with nurse. She denies any c/o with walking. Nurse states that she walk 400 feet. This is her second walk today. I completed discharge education with pt. We discussed incisonal care, activity restrictions, heart health diabetic diet, exercise guidelines,use of IS and Outpt. CRP. She voices interest in [facility]. CRP will send letter of interest to [facility]. I encouraged her to watch recovery from heart surgery video,We will continue to follow pt.

## 2019-05-27 NOTE — Progress Notes (Addendum)
      [address]       [place] [address]             [phone]        3 Days Post-Op Procedure(s) (LRB): MINIMALLY INVASIVE MITRAL VALVE (MV) REPLACEMENT Using Carbomedics Optiform Heart Valve Size 67mm (Right) MINIMALLY INVASIVE MAZE PROCEDURE (N/A) TRANSESOPHAGEAL ECHOCARDIOGRAM (TEE) (N/A) Clipping Of Atrial Appendage Using AtriCure PRO2 clip size 60mm (N/A)  Subjective: Patient already walked this am. She was napping upon me entering the room. She has some incisional pain.  Objective: Vital signs in last 24 hours: Temp:  [97.6 F (36.4 C)-99 F (37.2 C)] 98.2 F (36.8 C) (08/01 0451) Pulse Rate:  [72-83] 83 (08/01 0451) Cardiac Rhythm: Normal sinus rhythm (07/31 1909) Resp:  [15-22] 16 (08/01 0451) BP: (99-115)/(55-71) 113/65 (08/01 0451) SpO2:  [94 %-100 %] 94 % (08/01 0451) Weight:  [68 kg] 68 kg (08/01 0251)  Pre op weight 63.5 kg Current Weight  05/27/19 68 kg       Intake/Output from previous day: 07/31 0701 - 08/01 0700 In: 120 [P.O.:120] Out: 1701 [Urine:1180; Chest Tube:521]   Physical Exam:  Cardiovascular: RRR, no murmur Pulmonary: Clear to auscultation on the left and diminished right basilar breath sounds Abdomen: Soft, non tender, bowel sounds present. Extremities: Mild bilateral lower extremity edema. Wounds: Aquacel intact.   Lab Results: CBC: Recent Labs    05/26/19 0632 05/27/19 0239  WBC 18.0* 15.0*  HGB 11.8* 10.7*  HCT 36.4 32.5*  PLT 107* 106*   BMET:  Recent Labs    05/26/19 0632 05/27/19 0239  NA 135 133*  K 4.4 4.1  CL 103 100  CO2 24 25  GLUCOSE 150* 137*  BUN 20 22  CREATININE 1.11* 1.00  CALCIUM 8.4* 8.3*    PT/INR:  Lab Results  Component Value Date   INR 1.3 (H) 05/27/2019   INR 1.3 (H) 05/26/2019   INR 1.5 (H) 05/24/2019   ABG:  INR: Will add last result for INR, ABG once components are confirmed Will add last 4 CBG results once components are confirmed  Assessment/Plan:  1. CV - SR in the 70-80's. On Multaq 400 bid, Coumadin. INR remains 1.3. Will increase Coumadin to 5 mg. 2.  Pulmonary - On room air. Chest tubes with 521 cc output last 24 hours. No CXR so will order for am. Encourage incentive spirometer 3. A on C diastolic heart failure-on Lasix 40 mg PO bid 4.  Expected acute blood loss anemia - H and H this am 10.7 and 32.5 5. DM-CBGs 169/138/120 . On Insulin PRN. Will restart Januvia. Pre op HGA1C 6.5 6. Thrombocytopenia-platelets this am remains 106,000. On Lovenox, ecasa. 7. Remove EPW  Donielle M ZimmermanPA-C 05/27/2019,8:10 AM [phone]   I have seen and examined the patient and agree with the assessment and plan as outlined.  Keep tubes in at least 1 more day.  D/C pacing wires.  Increase Coumadin  Rexene Alberts, MD 05/27/2019 11:45 AM

## 2019-05-27 NOTE — Plan of Care (Signed)
    Problem: Activity: Goal: Risk for activity intolerance will decrease Outcome: Progressing  05/27/2019 0306 by Jaymes Graff, RN Note: Pt transfers well to and from [facility]. Minimal assist.  Problem: Elimination: Goal: Will not experience complications related to bowel motility 05/27/2019 0306 by Jaymes Graff, RN Note: Had 4 loose BMs last night r/t stool softener use 05/26/2019 2144 by Jaymes Graff, RN Outcome: Progressing Goal: Will not experience complications related to urinary retention Outcome: Progressing   Problem: Pain Managment: Goal: General experience of comfort will improve Outcome: Progressing  05/27/2019 0306 by Jaymes Graff, RN Note: Pain well controlled w/ scheduled Tylenol. Only c/o back/shoulder pain from lying on back for prolonged periods of time.  Problem: Safety: Goal: Ability to remain free from injury will improve        05/26/2019 2144 by Jaymes Graff, RN Outcome: Progressing   Problem: Skin Integrity: Goal: Risk for impaired skin integrity will decrease Outcome: Progressing

## 2019-05-27 NOTE — Progress Notes (Signed)
On-Q pump discontinued w/o difficulty.  1x stitch removed.  No drainage.  Site intact.  Pt tolerrated well.

## 2019-05-27 NOTE — Progress Notes (Signed)
Pt walked approx 949ft w/ upright walker. Walked w/o O2, sats stayed above 90 for the entire walk. Pt felt slight short-winded at end of walk and was placed back on 1L for comfort. Sats 100%. Assisted to bathroom and chair.

## 2019-05-27 NOTE — Progress Notes (Signed)
Epicardial pacing wires removed at 1315 without difficulty.  Wires intact.  Vital signs being monitored q15 mins.  Bed rest intitiated.  Site clean/dry/no drainage Sharyn Lull

## 2019-05-28 ENCOUNTER — Inpatient Hospital Stay (HOSPITAL_COMMUNITY): Payer: BC Managed Care – PPO

## 2019-05-28 LAB — PROTIME-INR
INR: 1.3 — ABNORMAL HIGH (ref 0.8–1.2)
Prothrombin Time: 15.7 seconds — ABNORMAL HIGH (ref 11.4–15.2)

## 2019-05-28 LAB — GLUCOSE, CAPILLARY
Glucose-Capillary: 105 mg/dL — ABNORMAL HIGH (ref 70–99)
Glucose-Capillary: 122 mg/dL — ABNORMAL HIGH (ref 70–99)
Glucose-Capillary: 128 mg/dL — ABNORMAL HIGH (ref 70–99)
Glucose-Capillary: 122 mg/dL — ABNORMAL HIGH (ref 70–99)

## 2019-05-28 MED ORDER — LINAGLIPTIN 5 MG PO TABS
5.0000 mg | ORAL_TABLET | Freq: Every day | ORAL | Status: DC
  Administered 2019-05-28 – 2019-05-30 (×3): 5 mg via ORAL
  Filled 2019-05-28 (×3): qty 1

## 2019-05-28 NOTE — Progress Notes (Addendum)
      [address]       [place] [address]             [phone]        4 Days Post-Op Procedure(s) (LRB): MINIMALLY INVASIVE MITRAL VALVE (MV) REPLACEMENT Using Carbomedics Optiform Heart Valve Size 74mm (Right) MINIMALLY INVASIVE MAZE PROCEDURE (N/A) TRANSESOPHAGEAL ECHOCARDIOGRAM (TEE) (N/A) Clipping Of Atrial Appendage Using AtriCure PRO2 clip size 43mm (N/A)  Subjective: Patient walked several times yesterday. She has no specific complaints this am.  Objective: Vital signs in last 24 hours: Temp:  [97.8 F (36.6 C)-99.1 F (37.3 C)] 97.8 F (36.6 C) (08/02 0416) Pulse Rate:  [78-84] 84 (08/01 1937) Cardiac Rhythm: Normal sinus rhythm (08/01 2023) Resp:  [13-25] 23 (08/01 1937) BP: (99-116)/(54-73) 106/65 (08/01 1937) SpO2:  [92 %-100 %] 92 % (08/01 1937) Weight:  [65.1 kg] 65.1 kg (08/02 0736)  Pre op weight 63.5 kg Current Weight  05/28/19 65.1 kg       Intake/Output from previous day: 08/01 0701 - 08/02 0700 In: 560 [P.O.:560] Out: 2480 [Urine:2200; Chest Tube:280]   Physical Exam:  Cardiovascular: RRR, no murmur Pulmonary: Clear to auscultation on the left and slightlydiminished right basilar breath sounds Abdomen: Soft, non tender, bowel sounds present. Extremities: Mild bilateral lower extremity edema. Wound: Clean and dry  Lab Results: CBC: Recent Labs    05/26/19 0632 05/27/19 0239  WBC 18.0* 15.0*  HGB 11.8* 10.7*  HCT 36.4 32.5*  PLT 107* 106*   BMET:  Recent Labs    05/26/19 0632 05/27/19 0239  NA 135 133*  K 4.4 4.1  CL 103 100  CO2 24 25  GLUCOSE 150* 137*  BUN 20 22  CREATININE 1.11* 1.00  CALCIUM 8.4* 8.3*    PT/INR:  Lab Results  Component Value Date   INR 1.3 (H) 05/28/2019   INR 1.3 (H) 05/27/2019   INR 1.3 (H) 05/26/2019   ABG:  INR: Will add last result for INR, ABG once components are confirmed Will add last 4 CBG results once components are confirmed  Assessment/Plan:  1. CV - SR,  first degree heart block in the 70-80's. On Multaq 400 bid, Coumadin. INR remains 1.3. Continue Coumadin 5 mg as will see first result of this dose in am. 2.  Pulmonary - On room air. Chest tubes with 280 cc output last 24 hours. CXR ordered but not taken yet. Likely remove chest tubes in am. Encourage incentive spirometer 3. A on C diastolic heart failure-on Lasix 40 mg PO bid with excellent diuresis. Likely decrease to daily in am. 4.  Expected acute blood loss anemia - Last H and H  10.7 and 32.5 5. DM-CBGs 128/200/105 . On Insulin PRN. Will restart Januvia. Pre op HGA1C 6.5 6. Thrombocytopenia-last platelets 106,000. On Lovenox, ecasa.   Donielle M ZimmermanPA-C 05/28/2019,7:57 AM [phone]   I have seen and examined the patient and agree with the assessment and plan as outlined.  Leave tubes 1 more day.  Continue Coumadin 5 mg/day.  Hopefully home Tuesday  Rexene Alberts, MD 05/28/2019 10:30 AM

## 2019-05-29 ENCOUNTER — Inpatient Hospital Stay (HOSPITAL_COMMUNITY): Payer: BC Managed Care – PPO

## 2019-05-29 LAB — POCT I-STAT 7, (LYTES, BLD GAS, ICA,H+H)
Acid-base deficit: 4 mmol/L — ABNORMAL HIGH (ref 0.0–2.0)
Bicarbonate: 21.5 mmol/L (ref 20.0–28.0)
Calcium, Ion: 1.23 mmol/L (ref 1.15–1.40)
HCT: 28 % — ABNORMAL LOW (ref 36.0–46.0)
Hemoglobin: 9.5 g/dL — ABNORMAL LOW (ref 12.0–15.0)
O2 Saturation: 94 %
Patient temperature: 36.9
Potassium: 4.2 mmol/L (ref 3.5–5.1)
Sodium: 142 mmol/L (ref 135–145)
TCO2: 23 mmol/L (ref 22–32)
pCO2 arterial: 38.5 mmHg (ref 32.0–48.0)
pH, Arterial: 7.354 (ref 7.350–7.450)
pO2, Arterial: 72 mmHg — ABNORMAL LOW (ref 83.0–108.0)

## 2019-05-29 LAB — CBC
HCT: 33.6 % — ABNORMAL LOW (ref 36.0–46.0)
Hemoglobin: 11 g/dL — ABNORMAL LOW (ref 12.0–15.0)
MCH: 29.2 pg (ref 26.0–34.0)
MCHC: 32.7 g/dL (ref 30.0–36.0)
MCV: 89.1 fL (ref 80.0–100.0)
Platelets: 182 10*3/uL (ref 150–400)
RBC: 3.77 MIL/uL — ABNORMAL LOW (ref 3.87–5.11)
RDW: 13.2 % (ref 11.5–15.5)
WBC: 9 10*3/uL (ref 4.0–10.5)
nRBC: 0 % (ref 0.0–0.2)

## 2019-05-29 LAB — BASIC METABOLIC PANEL
Anion gap: 10 (ref 5–15)
BUN: 21 mg/dL (ref 8–23)
CO2: 31 mmol/L (ref 22–32)
Calcium: 8.5 mg/dL — ABNORMAL LOW (ref 8.9–10.3)
Chloride: 94 mmol/L — ABNORMAL LOW (ref 98–111)
Creatinine, Ser: 1.08 mg/dL — ABNORMAL HIGH (ref 0.44–1.00)
GFR calc Af Amer: >60 mL/min (ref >60)
GFR calc non Af Amer: 54 mL/min — ABNORMAL LOW (ref >60)
Glucose, Bld: 127 mg/dL — ABNORMAL HIGH (ref 70–99)
Potassium: 3.5 mmol/L (ref 3.5–5.1)
Sodium: 135 mmol/L (ref 135–145)

## 2019-05-29 LAB — GLUCOSE, CAPILLARY
Glucose-Capillary: 119 mg/dL — ABNORMAL HIGH (ref 70–99)
Glucose-Capillary: 100 mg/dL — ABNORMAL HIGH (ref 70–99)
Glucose-Capillary: 151 mg/dL — ABNORMAL HIGH (ref 70–99)

## 2019-05-29 LAB — PROTIME-INR
INR: 1.3 — ABNORMAL HIGH (ref 0.8–1.2)
Prothrombin Time: 15.9 seconds — ABNORMAL HIGH (ref 11.4–15.2)

## 2019-05-29 MED ORDER — FUROSEMIDE 40 MG PO TABS
40.0000 mg | ORAL_TABLET | Freq: Every day | ORAL | Status: DC
  Administered 2019-05-29: 10:00:00 40 mg via ORAL
  Filled 2019-05-29 (×2): qty 1

## 2019-05-29 MED ORDER — WARFARIN SODIUM 7.5 MG PO TABS
7.5000 mg | ORAL_TABLET | Freq: Every evening | ORAL | Status: DC
  Administered 2019-05-29: 7.5 mg via ORAL
  Filled 2019-05-29: qty 1

## 2019-05-29 NOTE — Research (Signed)
TRAC-AF REGISTRY Informed Consent   Subject Name: Melody Torres  Subject met inclusion and exclusion criteria.  The informed consent form, study requirements and expectations were reviewed with the subject and questions and concerns were addressed prior to the signing of the consent form.  The subject verbalized understanding of the trial requirements.  The subject agreed to participate in the [place] and signed the informed consent.  The informed consent was obtained prior to performance of any protocol-specific procedures for the subject.  A copy of the signed informed consent was given to the subject and a copy was placed in the subject's medical record.  Berneda Rose 05/29/2019, 4:34 PM

## 2019-05-29 NOTE — Progress Notes (Signed)
Patient education completed for removal of chest tube, patient verbalized understanding,chest tubes removed as ordered, procedure will tolerated will continue to monitor.

## 2019-05-29 NOTE — Progress Notes (Addendum)
      [address]       [place] [address]             [phone]      5 Days Post-Op Procedure(s) (LRB): MINIMALLY INVASIVE MITRAL VALVE (MV) REPLACEMENT Using Carbomedics Optiform Heart Valve Size 29mm (Right) MINIMALLY INVASIVE MAZE PROCEDURE (N/A) TRANSESOPHAGEAL ECHOCARDIOGRAM (TEE) (N/A) Clipping Of Atrial Appendage Using AtriCure PRO2 clip size 73mm (N/A)   Subjective:  Patient having a little more pain this morning.  Likes to have something in her stomach before she takes medication, so she has not yet taken pain medication this morning.  Objective: Vital signs in last 24 hours: Temp:  [98 F (36.7 C)-99.3 F (37.4 C)] 99.3 F (37.4 C) (08/03 0500) Pulse Rate:  [75-81] 81 (08/03 0500) Cardiac Rhythm: Normal sinus rhythm (08/02 2100) Resp:  [18-22] 20 (08/03 0505) BP: (104-120)/(60-66) 120/63 (08/03 0500) SpO2:  [93 %-96 %] 93 % (08/03 0500) Weight:  [65.6 kg] 65.6 kg (08/03 0505)  Intake/Output from previous day: 08/02 0701 - 08/03 0700 In: -  Out: 5102 [Urine:1250; Chest Tube:140]  General appearance: alert, cooperative and no distress Heart: regular rate and rhythm Lungs: clear to auscultation bilaterally Abdomen: soft, non-tender; bowel sounds normal; no masses,  no organomegaly Extremities: edema none appreciated  Wound: clean and dry  Lab Results: Recent Labs    05/27/19 0239 05/29/19 0308  WBC 15.0* 9.0  HGB 10.7* 11.0*  HCT 32.5* 33.6*  PLT 106* 182   BMET:  Recent Labs    05/27/19 0239 05/29/19 0308  NA 133* 135  K 4.1 3.5  CL 100 94*  CO2 25 31  GLUCOSE 137* 127*  BUN 22 21  CREATININE 1.00 1.08*  CALCIUM 8.3* 8.5*    PT/INR:  Recent Labs    05/29/19 0308  LABPROT 15.9*  INR 1.3*   ABG    Component Value Date/Time   PHART 7.370 05/25/2019 0633   HCO3 21.7 05/25/2019 0633   TCO2 23 05/25/2019 0633   ACIDBASEDEF 3.0 (H) 05/25/2019 0633   O2SAT 97.0 05/25/2019 0633   CBG (last 3)  Recent Labs   05/28/19 1658 05/28/19 2131 05/29/19 0638  GLUCAP 128* 122* 119*    Assessment/Plan: S/P Procedure(s) (LRB): MINIMALLY INVASIVE MITRAL VALVE (MV) REPLACEMENT Using Carbomedics Optiform Heart Valve Size 45mm (Right) MINIMALLY INVASIVE MAZE PROCEDURE (N/A) TRANSESOPHAGEAL ECHOCARDIOGRAM (TEE) (N/A) Clipping Of Atrial Appendage Using AtriCure PRO2 clip size 10mm (N/A)  1. CV- NSR, BP well controlled- on Multaq 2. INR 1.3, after 4 doses of coumadin, dosing increased by Dr. Roxy Manns to 7.5 mg daily, which was patient's home dose 3. Pulm- no acute issues, CT output 50 cc yesterday- order to remove placed by Dr. Roxy Manns  4. Renal- creatinine stable, K is 3.5, weight is 10 lbs below baseline, will decrease Lasix to 40 mg once daily, supplement K 5. DM- sugars controlled 6. Dispo- patient stable, d/c chest tubes today, increase coumadin, decrease diuretics, if remains stable will plan for d/c in AM   LOS: 5 days    Erin Barrett 05/29/2019  I have seen and examined the patient and agree with the assessment and plan as outlined.  Tentatively plan d/c home tomorrow.  Rexene Alberts, MD 05/29/2019 8:43 AM

## 2019-05-30 ENCOUNTER — Inpatient Hospital Stay (HOSPITAL_COMMUNITY): Payer: BC Managed Care – PPO

## 2019-05-30 LAB — GLUCOSE, CAPILLARY
Glucose-Capillary: 115 mg/dL — ABNORMAL HIGH (ref 70–99)
Glucose-Capillary: 112 mg/dL — ABNORMAL HIGH (ref 70–99)

## 2019-05-30 LAB — PROTIME-INR
INR: 1.3 — ABNORMAL HIGH (ref 0.8–1.2)
Prothrombin Time: 15.9 seconds — ABNORMAL HIGH (ref 11.4–15.2)

## 2019-05-30 MED ORDER — FUROSEMIDE 40 MG PO TABS: 40.0000 mg | ORAL_TABLET | Freq: Every day | ORAL | 0 refills | Status: DC

## 2019-05-30 MED ORDER — ASPIRIN 81 MG PO TBEC: 81.0000 mg | DELAYED_RELEASE_TABLET | Freq: Every day | ORAL | Status: AC

## 2019-05-30 MED ORDER — POTASSIUM CHLORIDE CRYS ER 20 MEQ PO TBCR: 20.0000 meq | EXTENDED_RELEASE_TABLET | Freq: Every day | ORAL | 0 refills | Status: DC

## 2019-05-30 MED ORDER — TRAMADOL HCL 50 MG PO TABS: 50.0000 mg | ORAL_TABLET | ORAL | 0 refills | Status: DC | PRN

## 2019-05-30 NOTE — Progress Notes (Addendum)
      [address]       [place] [address]             [phone]      6 Days Post-Op Procedure(s) (LRB): MINIMALLY INVASIVE MITRAL VALVE (MV) REPLACEMENT Using Carbomedics Optiform Heart Valve Size 62mm (Right) MINIMALLY INVASIVE MAZE PROCEDURE (N/A) TRANSESOPHAGEAL ECHOCARDIOGRAM (TEE) (N/A) Clipping Of Atrial Appendage Using AtriCure PRO2 clip size 31mm (N/A)   Subjective:  No new complaints.  She is ready to go home.   Objective: Vital signs in last 24 hours: Temp:  [97.8 F (36.6 C)-99.7 F (37.6 C)] 99.7 F (37.6 C) (08/04 0535) Pulse Rate:  [74-84] 84 (08/04 0535) Cardiac Rhythm: Normal sinus rhythm;Heart block (08/03 1903) Resp:  [13-20] 20 (08/04 0535) BP: (99-126)/(54-72) 112/65 (08/04 0535) SpO2:  [93 %-100 %] 93 % (08/04 0535) Weight:  [61.9 kg] 61.9 kg (08/04 0535)  Intake/Output from previous day: 08/03 0701 - 08/04 0700 In: 720 [P.O.:720] Out: 1750 [Urine:1750]  General appearance: alert, cooperative and no distress Heart: regular rate and rhythm Lungs: clear to auscultation bilaterally Abdomen: soft, non-tender; bowel sounds normal; no masses,  no organomegaly Extremities: edema trace Wound: clean and dry  Lab Results: Recent Labs    05/29/19 0308  WBC 9.0  HGB 11.0*  HCT 33.6*  PLT 182   BMET:  Recent Labs    05/29/19 0308  NA 135  K 3.5  CL 94*  CO2 31  GLUCOSE 127*  BUN 21  CREATININE 1.08*  CALCIUM 8.5*    PT/INR:  Recent Labs    05/30/19 0511  LABPROT 15.9*  INR 1.3*   ABG    Component Value Date/Time   PHART 7.370 05/25/2019 0633   HCO3 21.7 05/25/2019 0633   TCO2 23 05/25/2019 0633   ACIDBASEDEF 3.0 (H) 05/25/2019 0633   O2SAT 97.0 05/25/2019 0633   CBG (last 3)  Recent Labs    05/29/19 1615 05/29/19 2126 05/30/19 0643  GLUCAP 151* 115* 112*    Assessment/Plan: S/P Procedure(s) (LRB): MINIMALLY INVASIVE MITRAL VALVE (MV) REPLACEMENT Using Carbomedics Optiform Heart Valve Size 46mm  (Right) MINIMALLY INVASIVE MAZE PROCEDURE (N/A) TRANSESOPHAGEAL ECHOCARDIOGRAM (TEE) (N/A) Clipping Of Atrial Appendage Using AtriCure PRO2 clip size 34mm (N/A)  1. CV- NSR, BP controlled- continue Multaq 2. INR 1.3, continue coumadin at home regimen of 7.5 mg daily 3. Pulm- no acute issues, continue IS 4. Renal- creatinine stable, weight is below baseline, continue Lasix 5. DM- sugars remain controlled 6. Dispo- patient stable, will d/c home today   LOS: 6 days    Erin Barrett 05/30/2019   I have seen and examined the patient and agree with the assessment and plan as outlined.  D/C home.  Instructions given.  Rexene Alberts, MD 05/30/2019 8:22 AM

## 2019-05-30 NOTE — Progress Notes (Signed)
Patient in a stable condition, discharged education reviewed with patient she verbalized understanding, iv removed , tele dc ccmd notified, patient belongings at bedside, patient to be transported home by her family.

## 2019-05-30 NOTE — Plan of Care (Signed)
  Problem: Health Behavior/Discharge Planning: Goal: Ability to manage health-related needs will improve Outcome: Completed/Met   Problem: Clinical Measurements: Goal: Ability to maintain clinical measurements within normal limits will improve Outcome: Completed/Met Goal: Will remain free from infection Outcome: Completed/Met Goal: Diagnostic test results will improve Outcome: Completed/Met Goal: Respiratory complications will improve Outcome: Completed/Met Goal: Cardiovascular complication will be avoided Outcome: Completed/Met   Problem: Activity: Goal: Risk for activity intolerance will decrease Outcome: Completed/Met

## 2019-06-03 DIAGNOSIS — R9431 Abnormal electrocardiogram [ECG] [EKG]: Secondary | ICD-10-CM | POA: Diagnosis not present

## 2019-06-05 ENCOUNTER — Other Ambulatory Visit: Payer: Self-pay

## 2019-06-05 ENCOUNTER — Encounter (INDEPENDENT_AMBULATORY_CARE_PROVIDER_SITE_OTHER): Payer: Self-pay

## 2019-06-05 DIAGNOSIS — Z4802 Encounter for removal of sutures: Secondary | ICD-10-CM

## 2019-06-06 ENCOUNTER — Ambulatory Visit (INDEPENDENT_AMBULATORY_CARE_PROVIDER_SITE_OTHER): Payer: BC Managed Care – PPO | Admitting: *Deleted

## 2019-06-06 DIAGNOSIS — I480 Paroxysmal atrial fibrillation: Secondary | ICD-10-CM | POA: Diagnosis not present

## 2019-06-06 DIAGNOSIS — I099 Rheumatic heart disease, unspecified: Secondary | ICD-10-CM | POA: Diagnosis not present

## 2019-06-06 DIAGNOSIS — Z5181 Encounter for therapeutic drug level monitoring: Secondary | ICD-10-CM | POA: Diagnosis not present

## 2019-06-06 DIAGNOSIS — Z7901 Long term (current) use of anticoagulants: Secondary | ICD-10-CM

## 2019-06-06 LAB — POCT INR: INR: 1.3 — AB (ref 2.0–3.0)

## 2019-06-06 NOTE — Patient Instructions (Signed)
Description   Restart taking 7.5mg  daily. Resume lovenox 100mg  injection into fatty abdominal tissue every 24 hours (once a day), start today.  Call the office with any medication changes or additions [phone].

## 2019-06-08 ENCOUNTER — Ambulatory Visit (INDEPENDENT_AMBULATORY_CARE_PROVIDER_SITE_OTHER): Payer: Self-pay

## 2019-06-08 ENCOUNTER — Other Ambulatory Visit: Payer: Self-pay

## 2019-06-08 DIAGNOSIS — Z4802 Encounter for removal of sutures: Secondary | ICD-10-CM

## 2019-06-09 ENCOUNTER — Ambulatory Visit (INDEPENDENT_AMBULATORY_CARE_PROVIDER_SITE_OTHER): Payer: BC Managed Care – PPO | Admitting: *Deleted

## 2019-06-09 DIAGNOSIS — I480 Paroxysmal atrial fibrillation: Secondary | ICD-10-CM

## 2019-06-09 DIAGNOSIS — I099 Rheumatic heart disease, unspecified: Secondary | ICD-10-CM

## 2019-06-09 DIAGNOSIS — Z7901 Long term (current) use of anticoagulants: Secondary | ICD-10-CM

## 2019-06-09 DIAGNOSIS — Z5181 Encounter for therapeutic drug level monitoring: Secondary | ICD-10-CM | POA: Diagnosis not present

## 2019-06-09 LAB — POCT INR: INR: 1.6 — AB (ref 2.0–3.0)

## 2019-06-09 NOTE — Patient Instructions (Signed)
Description   Today and tomorrow take 10mg , then continue  taking 7.5mg  daily. Continue lovenox 100mg  injection into fatty abdominal tissue every 24 hours (once a day), until Monday. Recheck on Tuesday.  Call  office with any medication changes or additions [phone]. 2 injections of Lovenox 100mg  given in office per Pharm D, Raquel Lot [id number] Exp 06/2020.

## 2019-06-13 ENCOUNTER — Encounter: Payer: Self-pay | Admitting: Cardiology

## 2019-06-13 ENCOUNTER — Other Ambulatory Visit: Payer: Self-pay

## 2019-06-13 ENCOUNTER — Ambulatory Visit (INDEPENDENT_AMBULATORY_CARE_PROVIDER_SITE_OTHER): Payer: BC Managed Care – PPO | Admitting: *Deleted

## 2019-06-13 DIAGNOSIS — E785 Hyperlipidemia, unspecified: Secondary | ICD-10-CM | POA: Diagnosis not present

## 2019-06-13 DIAGNOSIS — I099 Rheumatic heart disease, unspecified: Secondary | ICD-10-CM | POA: Diagnosis not present

## 2019-06-13 DIAGNOSIS — I480 Paroxysmal atrial fibrillation: Secondary | ICD-10-CM | POA: Diagnosis not present

## 2019-06-13 DIAGNOSIS — Z5181 Encounter for therapeutic drug level monitoring: Secondary | ICD-10-CM | POA: Diagnosis not present

## 2019-06-13 DIAGNOSIS — I4891 Unspecified atrial fibrillation: Secondary | ICD-10-CM | POA: Diagnosis not present

## 2019-06-13 DIAGNOSIS — R9431 Abnormal electrocardiogram [ECG] [EKG]: Secondary | ICD-10-CM | POA: Diagnosis not present

## 2019-06-13 DIAGNOSIS — Z7901 Long term (current) use of anticoagulants: Secondary | ICD-10-CM | POA: Diagnosis not present

## 2019-06-13 DIAGNOSIS — Z954 Presence of other heart-valve replacement: Secondary | ICD-10-CM | POA: Diagnosis not present

## 2019-06-13 LAB — POCT INR: INR: 2 (ref 2.0–3.0)

## 2019-06-13 NOTE — Patient Instructions (Signed)
Description   Today take 10mg , then continue  taking 7.5mg  daily.  Recheck on Tuesday.  Call  office with any medication changes or additions [phone].

## 2019-06-15 ENCOUNTER — Ambulatory Visit (INDEPENDENT_AMBULATORY_CARE_PROVIDER_SITE_OTHER): Payer: BC Managed Care – PPO | Admitting: Cardiology

## 2019-06-15 ENCOUNTER — Encounter: Payer: Self-pay | Admitting: Cardiology

## 2019-06-15 ENCOUNTER — Other Ambulatory Visit: Payer: Self-pay

## 2019-06-15 VITALS — BP 122/63 | HR 93 | Ht 63.0 in | Wt 133.8 lb

## 2019-06-15 DIAGNOSIS — Z954 Presence of other heart-valve replacement: Secondary | ICD-10-CM | POA: Diagnosis not present

## 2019-06-15 DIAGNOSIS — Z9889 Other specified postprocedural states: Secondary | ICD-10-CM | POA: Diagnosis not present

## 2019-06-15 DIAGNOSIS — Z8679 Personal history of other diseases of the circulatory system: Secondary | ICD-10-CM

## 2019-06-15 DIAGNOSIS — I099 Rheumatic heart disease, unspecified: Secondary | ICD-10-CM | POA: Diagnosis not present

## 2019-06-15 DIAGNOSIS — I480 Paroxysmal atrial fibrillation: Secondary | ICD-10-CM

## 2019-06-15 DIAGNOSIS — Z7901 Long term (current) use of anticoagulants: Secondary | ICD-10-CM

## 2019-06-15 NOTE — Progress Notes (Signed)
Cardiology Office Note:    Date:  06/15/2019   ID:  Melody Torres, DOB [birth year], MRN [id number]  PCP:  Street, Sharon Mt, MD  Cardiologist:  Jenne Campus, MD    Referring MD: Street, Sharon Mt, *   Chief Complaint  Patient presents with  . Follow-up    History of Present Illness:    Melody Torres is a 63 y.o. female with rheumatic heart disease.  3 weeks ago she underwent minimally invasive mitral valve replacement with mechanical prosthesis as well as maze procedure.  Recovery was uneventful and she actually looks quite good.  She started rehab program however last week I was called because of frequent ventricular ectopy.  Electrolytes were checked those were normal.  She is doing well still described to have some soreness in the chest but overall considering recent open heart surgery I think she is doing very well.  Described to have some pounding in the chest that she feels when she laid on the left side of her chest.  No shortness of breath no swelling of her extremities.  Past Medical History:  Diagnosis Date  . CHF (congestive heart failure) ([facility]) 2017  . Diabetes mellitus without complication ([facility])    type 2  . GERD (gastroesophageal reflux disease)   . Hyperlipidemia   . Hyperlipidemia    no meds, diet controlled  . Paroxysmal atrial fibrillation ([facility]) 01/20/2016   Overview:  Chads score 2 anticoagulated with warfarin  . Rheumatic heart disease   . Rheumatic mitral regurgitation 01/20/2016   Overview:  Mild to moderate  . S/P Minimally invasive maze operation for atrial fibrillation 05/24/2019   Complete bilateral atrial lesion set using cryothermy and bipolar radiofrequency ablation with clipping of LA appendage via right mini-thoracotomy approach  . S/P minimally invasive mitral valve replacement with mechanical valve 05/24/2019   33 mm Sorin Carbomedics bileaflet mechanical valve via right mini thoracotomy approach  . Severe mitral valve stenosis  01/20/2016  . Tricuspid regurgitation     Past Surgical History:  Procedure Laterality Date  . CLIPPING OF ATRIAL APPENDAGE N/A 05/24/2019   Procedure: Clipping Of Atrial Appendage Using AtriCure PRO2 clip size 57mm;  Surgeon: Rexene Alberts, MD;  Location: [facility];  Service: Open Heart Surgery;  Laterality: N/A;  . MINIMALLY INVASIVE MAZE PROCEDURE N/A 05/24/2019   Procedure: MINIMALLY INVASIVE MAZE PROCEDURE;  Surgeon: Rexene Alberts, MD;  Location: [facility];  Service: Open Heart Surgery;  Laterality: N/A;  . MITRAL VALVE REPLACEMENT Right 05/24/2019   Procedure: MINIMALLY INVASIVE MITRAL VALVE (MV) REPLACEMENT Using Carbomedics Optiform Heart Valve Size 20mm;  Surgeon: Rexene Alberts, MD;  Location: [facility];  Service: Open Heart Surgery;  Laterality: Right;  . RIGHT/LEFT HEART CATH AND CORONARY ANGIOGRAPHY N/A 04/07/2019   Procedure: RIGHT/LEFT HEART CATH AND CORONARY ANGIOGRAPHY;  Surgeon: Leonie Man, MD;  Location: [facility];  Service: Cardiovascular;  Laterality: N/A;  . TEE WITHOUT CARDIOVERSION N/A 01/06/2019   Procedure: TRANSESOPHAGEAL ECHOCARDIOGRAM (TEE);  Surgeon: Dorothy Spark, MD;  Location: [facility];  Service: Cardiovascular;  Laterality: N/A;  . TEE WITHOUT CARDIOVERSION N/A 05/24/2019   Procedure: TRANSESOPHAGEAL ECHOCARDIOGRAM (TEE);  Surgeon: Rexene Alberts, MD;  Location: [facility];  Service: Open Heart Surgery;  Laterality: N/A;  . WISDOM TOOTH EXTRACTION      Current Medications: Current Meds  Medication Sig  . aspirin EC 81 MG EC tablet Take 1 tablet (81 mg total) by mouth daily.  . Chromium (  CHROMEMATE PO) Take 600 mg by mouth daily.   . clobetasol cream (TEMOVATE) 0.05 % Apply 1 application topically 2 (two) times daily as needed (for lichen sclerosus).   Marland Kitchen. JANUVIA 100 MG tablet Take 100 mg by mouth every evening.   . loratadine (CLARITIN) 10 MG tablet Take 10 mg by mouth daily.  . MULTAQ 400 MG tablet TAKE 1 TABLET BY MOUTH TWICE (2) DAILY  (Patient taking differently: Take 400 mg by mouth 2 (two) times daily with a meal. )  . omeprazole (PRILOSEC) 20 MG capsule Take 20 mg by mouth daily as needed (for acid reflux or heartburn).   . pravastatin (PRAVACHOL) 40 MG tablet TAKE 1 TABLET BY MOUTH DAILY WITH SUPPER. *NEED APPOINTMENT FOR FURTHER REFILLS*  . traMADol (ULTRAM) 50 MG tablet Take 1-2 tablets (50-100 mg total) by mouth every 4 (four) hours as needed for moderate pain.  Marland Kitchen. warfarin (COUMADIN) 5 MG tablet Take As Directed by [facility] (Patient taking differently: Take 7.5 mg by mouth every evening. )     Allergies:   Adhesive [tape] and Penicillins   Social History   Socioeconomic History  . Marital status: Widowed    Spouse name: Not on file  . Number of children: Not on file  . Years of education: Not on file  . Highest education level: Not on file  Occupational History  . Not on file  Social Needs  . Financial resource strain: Not on file  . Food insecurity    Worry: Not on file    Inability: Not on file  . Transportation needs    Medical: Not on file    Non-medical: Not on file  Tobacco Use  . Smoking status: Never Smoker  . Smokeless tobacco: Never Used  Substance and Sexual Activity  . Alcohol use: No  . Drug use: No  . Sexual activity: Not on file  Lifestyle  . Physical activity    Days per week: Not on file    Minutes per session: Not on file  . Stress: Not on file  Relationships  . Social Musicianconnections    Talks on phone: Not on file    Gets together: Not on file    Attends religious service: Not on file    Active member of club or organization: Not on file    Attends meetings of clubs or organizations: Not on file    Relationship status: Not on file  Other Topics Concern  . Not on file  Social History Narrative  . Not on file     Family History: The patient's family history includes Bladder Cancer in her mother; Diabetes in her father; Heart attack in her mother; Heart disease in her  father; Hypothyroidism in her mother; Stroke in her father. ROS:   Please see the history of present illness.    All 14 point review of systems negative except as described per history of present illness  EKGs/Labs/Other Studies Reviewed:      Recent Labs: 05/24/2019: ALT 35 05/25/2019: Magnesium 2.4 05/29/2019: BUN 21; Creatinine, Ser 1.08; Hemoglobin 11.0; Platelets 182; Potassium 3.5; Sodium 135  Recent Lipid Panel    Component Value Date/Time   CHOL 140 04/29/2017 1656   TRIG 232 (H) 04/29/2017 1656   HDL 37 (L) 04/29/2017 1656   CHOLHDL 3.8 04/29/2017 1656   LDLCALC 57 04/29/2017 1656    Physical Exam:    VS:  BP 122/63   Pulse 93   Ht 5\' 3"  (1.6 m)  Wt 133 lb 12.8 oz (60.7 kg)   LMP  (LMP Unknown)   SpO2 98%   BMI 23.70 kg/m     Wt Readings from Last 3 Encounters:  06/15/19 133 lb 12.8 oz (60.7 kg)  05/30/19 136 lb 8 oz (61.9 kg)  05/22/19 140 lb (63.5 kg)     GEN:  Well nourished, well developed in no acute distress HEENT: Normal NECK: No JVD; No carotid bruits LYMPHATICS: No lymphadenopathy CARDIAC: RRR, mechanical valve sounds are crisp, there is systolic murmur grade 1/6 best heard at apex, no rubs, no gallops RESPIRATORY:  Clear to auscultation without rales, wheezing or rhonchi  ABDOMEN: Soft, non-tender, non-distended MUSCULOSKELETAL:  No edema; No deformity  SKIN: Warm and dry LOWER EXTREMITIES: no swelling NEUROLOGIC:  Alert and oriented x 3 PSYCHIATRIC:  Normal affect   ASSESSMENT:    1. Paroxysmal atrial fibrillation (HCC)   2. S/P minimally invasive mitral valve replacement with mechanical valve + maze procedure   3. Rheumatic heart disease   4. S/P Minimally invasive maze operation for atrial fibrillation   5. Long term (current) use of anticoagulants [Z79.01]    PLAN:    In order of problems listed above:  1. Paroxysmal atrial fibrillation.  EKG today shows sinus rhythm with frequent PVCs the origin of those PVCs is right ventricle  outflow tract.  In nonspecific ST-T segment changes.  She is anticoagulated with Coumadin which will continue.  She seems to maintaining sinus rhythm 2. Status post minimally invasive mitral valve replacement overall doing well recovery is very good wounds are healing nicely.  I considered high risk of catheter as very good. 3. Rheumatic heart disease.  Noted 4. Status post minimally invasive maze procedure.  Doing well maintaining sinus rhythm 5. Long-term anticoagulation will continue. 6. Ventricular ectopy.  Origin of PVCs right ventricle outflow tract I think this is simply related to his surgery.  She is on Multaq for atrial fibrillation maintenance.  Which I will continue.  I will see her back in my office in about a month EKG will be done then at that time she may require some heart rate monitor to see if there is still some frequent ventricular ectopy.  I consider dose extrasystole at this time simply recovery after her surgery.  From my standpoint of view she can return to rehab.  She need to be continuously monitor while in rehab.  Exercise need to be stopped if more sustained arrhythmias noted.   Medication Adjustments/Labs and Tests Ordered: Current medicines are reviewed at length with the patient today.  Concerns regarding medicines are outlined above.  Orders Placed This Encounter  Procedures  . EKG 12-Lead   Medication changes: No orders of the defined types were placed in this encounter.   Signed, Georgeanna Leaobert J. Doreen Garretson, MD, [facility] HospitalFACC 06/15/2019 9:34 AM    [facility]

## 2019-06-15 NOTE — Patient Instructions (Signed)
Medication Instructions:  Your physician recommends that you continue on your current medications as directed. Please refer to the Current Medication list given to you today.  If you need a refill on your cardiac medications before your next appointment, please call your pharmacy.   Lab work: None.  If you have labs (blood work) drawn today and your tests are completely normal, you will receive your results only by: . MyChart Message (if you have MyChart) OR . A paper copy in the mail If you have any lab test that is abnormal or we need to change your treatment, we will call you to review the results.  Testing/Procedures: None.   Follow-Up: At [facility], you and your health needs are our priority.  As part of our continuing mission to provide you with exceptional heart care, we have created designated Provider Care Teams.  These Care Teams include your primary Cardiologist (physician) and Advanced Practice Providers (APPs -  Physician Assistants and Nurse Practitioners) who all work together to provide you with the care you need, when you need it. You will need a follow up appointment in 1 months.  Please call our office 2 months in advance to schedule this appointment.  You may see Robert Krasowski, MD or another member of our [facility] Provider Team in [place]: Brian Munley, MD . Rajan Revankar, MD  Any Other Special Instructions Will Be Listed Below (If Applicable).     

## 2019-06-19 DIAGNOSIS — I4891 Unspecified atrial fibrillation: Secondary | ICD-10-CM | POA: Diagnosis not present

## 2019-06-19 DIAGNOSIS — E785 Hyperlipidemia, unspecified: Secondary | ICD-10-CM | POA: Diagnosis not present

## 2019-06-19 DIAGNOSIS — Z954 Presence of other heart-valve replacement: Secondary | ICD-10-CM | POA: Diagnosis not present

## 2019-06-19 DIAGNOSIS — R9431 Abnormal electrocardiogram [ECG] [EKG]: Secondary | ICD-10-CM | POA: Diagnosis not present

## 2019-06-20 ENCOUNTER — Ambulatory Visit (INDEPENDENT_AMBULATORY_CARE_PROVIDER_SITE_OTHER): Payer: BC Managed Care – PPO | Admitting: *Deleted

## 2019-06-20 ENCOUNTER — Other Ambulatory Visit: Payer: Self-pay

## 2019-06-20 DIAGNOSIS — I5030 Unspecified diastolic (congestive) heart failure: Secondary | ICD-10-CM | POA: Diagnosis not present

## 2019-06-20 DIAGNOSIS — I099 Rheumatic heart disease, unspecified: Secondary | ICD-10-CM

## 2019-06-20 DIAGNOSIS — Z5181 Encounter for therapeutic drug level monitoring: Secondary | ICD-10-CM

## 2019-06-20 DIAGNOSIS — E785 Hyperlipidemia, unspecified: Secondary | ICD-10-CM | POA: Diagnosis not present

## 2019-06-20 DIAGNOSIS — I480 Paroxysmal atrial fibrillation: Secondary | ICD-10-CM | POA: Diagnosis not present

## 2019-06-20 DIAGNOSIS — I4891 Unspecified atrial fibrillation: Secondary | ICD-10-CM | POA: Diagnosis not present

## 2019-06-20 DIAGNOSIS — E1169 Type 2 diabetes mellitus with other specified complication: Secondary | ICD-10-CM | POA: Diagnosis not present

## 2019-06-20 DIAGNOSIS — Z7901 Long term (current) use of anticoagulants: Secondary | ICD-10-CM | POA: Diagnosis not present

## 2019-06-20 LAB — POCT INR: INR: 2.2 (ref 2.0–3.0)

## 2019-06-20 NOTE — Patient Instructions (Signed)
Description   Today take 10mg , then change your dose to 7.5mg  daily except 10mg  on Tuesdays.   Recheck in 2 weeks.  Call  office with any medication changes or additions [phone].

## 2019-06-21 ENCOUNTER — Other Ambulatory Visit: Payer: Self-pay | Admitting: Cardiology

## 2019-06-21 DIAGNOSIS — I4891 Unspecified atrial fibrillation: Secondary | ICD-10-CM | POA: Diagnosis not present

## 2019-06-21 DIAGNOSIS — R9431 Abnormal electrocardiogram [ECG] [EKG]: Secondary | ICD-10-CM | POA: Diagnosis not present

## 2019-06-21 DIAGNOSIS — E785 Hyperlipidemia, unspecified: Secondary | ICD-10-CM | POA: Diagnosis not present

## 2019-06-21 DIAGNOSIS — Z954 Presence of other heart-valve replacement: Secondary | ICD-10-CM | POA: Diagnosis not present

## 2019-06-23 ENCOUNTER — Other Ambulatory Visit: Payer: Self-pay | Admitting: Thoracic Surgery (Cardiothoracic Vascular Surgery)

## 2019-06-23 DIAGNOSIS — E785 Hyperlipidemia, unspecified: Secondary | ICD-10-CM | POA: Diagnosis not present

## 2019-06-23 DIAGNOSIS — I4891 Unspecified atrial fibrillation: Secondary | ICD-10-CM | POA: Diagnosis not present

## 2019-06-23 DIAGNOSIS — Z954 Presence of other heart-valve replacement: Secondary | ICD-10-CM

## 2019-06-23 DIAGNOSIS — R9431 Abnormal electrocardiogram [ECG] [EKG]: Secondary | ICD-10-CM | POA: Diagnosis not present

## 2019-06-26 ENCOUNTER — Ambulatory Visit (INDEPENDENT_AMBULATORY_CARE_PROVIDER_SITE_OTHER): Payer: Self-pay | Admitting: Thoracic Surgery (Cardiothoracic Vascular Surgery)

## 2019-06-26 ENCOUNTER — Encounter: Payer: Self-pay | Admitting: Thoracic Surgery (Cardiothoracic Vascular Surgery)

## 2019-06-26 ENCOUNTER — Other Ambulatory Visit: Payer: Self-pay

## 2019-06-26 ENCOUNTER — Ambulatory Visit
Admission: RE | Admit: 2019-06-26 | Discharge: 2019-06-26 | Disposition: A | Discharge status: Home / Self Care | Payer: BC Managed Care – PPO | Source: Ambulatory Visit | Attending: Thoracic Surgery (Cardiothoracic Vascular Surgery) | Admitting: Thoracic Surgery (Cardiothoracic Vascular Surgery)

## 2019-06-26 VITALS — BP 96/59 | HR 96 | Temp 97.3°F | Resp 16 | Ht 63.0 in | Wt 134.0 lb

## 2019-06-26 DIAGNOSIS — R9431 Abnormal electrocardiogram [ECG] [EKG]: Secondary | ICD-10-CM | POA: Diagnosis not present

## 2019-06-26 DIAGNOSIS — J9811 Atelectasis: Secondary | ICD-10-CM | POA: Diagnosis not present

## 2019-06-26 DIAGNOSIS — Z9889 Other specified postprocedural states: Secondary | ICD-10-CM

## 2019-06-26 DIAGNOSIS — Z8679 Personal history of other diseases of the circulatory system: Secondary | ICD-10-CM

## 2019-06-26 DIAGNOSIS — Z954 Presence of other heart-valve replacement: Secondary | ICD-10-CM | POA: Diagnosis not present

## 2019-06-26 DIAGNOSIS — E785 Hyperlipidemia, unspecified: Secondary | ICD-10-CM | POA: Diagnosis not present

## 2019-06-26 DIAGNOSIS — I4891 Unspecified atrial fibrillation: Secondary | ICD-10-CM | POA: Diagnosis not present

## 2019-06-26 NOTE — Progress Notes (Signed)
[address]       [place] [id number]             [phone]     CARDIOTHORACIC SURGERY OFFICE NOTE  Primary Cardiologist is Gypsy Balsamobert Krasowski, MD PCP is Street, [name], MD   HPI:  Patient is a 63 year old female with longstanding rheumatic heart disease who returns to the office today for routine follow-up status post minimally invasive mitral valve replacement using a mechanical prosthetic valve and maze procedure on May 24, 2023 severe mitral stenosis, mitral regurgitation, chronic diastolic congestive heart failure, and longstanding persistent atrial fibrillation.  Her early postoperative recovery was uneventful and she was discharged home on the sixth postoperative day.  After hospital discharge she did have some borderline low blood pressure and was evaluated by Dr. Bing MatterKrasowski.  Sodium levels were low and she was felt to be dehydrated.  Lasix was stopped.  She was notably in sinus rhythm at that time with occasional PVCs.  Coumadin dose has been adjusted.  She returns to our office today and reports that overall she is doing quite well.  She has minimal numbness and soreness across her right anterior chest wall related to her surgery.  Appetite is good.  She has no shortness of breath.  Overall she is pleased with her progress.   Current Outpatient Medications  Medication Sig Dispense Refill  . aspirin EC 81 MG EC tablet Take 1 tablet (81 mg total) by mouth daily.    . Chromium (CHROMEMATE PO) Take 600 mg by mouth daily.     . clobetasol cream (TEMOVATE) 0.05 % Apply 1 application topically 2 (two) times daily as needed (for lichen sclerosus).     Marland Kitchen. JANUVIA 100 MG tablet Take 100 mg by mouth every evening.   11  . loratadine (CLARITIN) 10 MG tablet Take 10 mg by mouth daily.    . MULTAQ 400 MG tablet TAKE 1 TABLET BY MOUTH TWICE (2) DAILY (Patient taking differently: Take 400 mg by mouth 2 (two) times daily with a meal. ) 60 tablet 2  . omeprazole  (PRILOSEC) 20 MG capsule Take 20 mg by mouth daily as needed (for acid reflux or heartburn).     . pravastatin (PRAVACHOL) 40 MG tablet TAKE 1 TABLET BY MOUTH DAILY WITH SUPPER. *NEED APPOINTMENT FOR FURTHER REFILLS* 90 tablet 0  . warfarin (COUMADIN) 5 MG tablet Take 1.5 tablets (7.5 mg total) by mouth every evening. 140 tablet 0  . traMADol (ULTRAM) 50 MG tablet Take 1-2 tablets (50-100 mg total) by mouth every 4 (four) hours as needed for moderate pain. 30 tablet 0   No current facility-administered medications for this visit.       Physical Exam:   BP (!) 96/59 (BP Location: Left Arm, Patient Position: Sitting, Cuff Size: Normal)   Pulse 96   Temp (!) 97.3 F (36.3 C)   Resp 16   Ht 5\' 3"  (1.6 m)   Wt 134 lb (60.8 kg)   LMP  (LMP Unknown)   SpO2 96% Comment: RA  BMI 23.74 kg/m   General:  Well-appearing  Chest:   Clear to auscultation with symmetric breath sounds  CV:   Regular rate and rhythm with mechanical heart valve sounds  Incisions:  Healing well  Abdomen:  Soft nontender  Extremities:  Warm and well-perfused, no edema  Diagnostic Tests:  CHEST - 2 VIEW  COMPARISON:  05/30/2019  FINDINGS: Cardiac shadow is stable. Postsurgical changes are  again seen and stable. Previously seen right basilar atelectasis has improved in the interval from the prior exam. Previously seen air-fluid collection in the chest wall has resolved. No pneumothorax is seen. No bony abnormality is noted.  IMPRESSION: Improved aeration in the right base with only minimal persistent atelectasis.  No other focal abnormality is seen.   Electronically Signed   By: Inez Catalina M.D.   On: 06/26/2019 14:05   2 channel telemetry rhythm strip demonstrates sinus rhythm   Impression:  Patient is doing well and maintaining sinus rhythm approximately 1 month status post minimally invasive mitral valve replacement using a mechanical prosthetic valve and maze procedure.    Plan:   We have not recommended any changes to the patient's current medications.  I have encouraged the patient to continue to gradually increase her physical activity as tolerated.  She may resume driving an automobile.  We have discussed return to work.  I think she would benefit from participation in the cardiac rehab program.  The patient has been reminded regarding the importance of dental hygiene and the lifelong need for antibiotic prophylaxis for all dental cleanings and other related invasive procedures.  All of her questions have been addressed.  Patient will return to our office for routine follow-up and rhythm check in approximately 2 months.    Valentina Gu. Roxy Manns, MD 06/26/2019 3:04 PM

## 2019-06-26 NOTE — Patient Instructions (Addendum)
Continue all previous medications without any changes at this time  You may continue to gradually increase your physical activity as tolerated.  Refrain from any heavy lifting or strenuous use of your arms and shoulders until at least 8 weeks from the time of your surgery, and avoid activities that cause increased pain in your chest on the side of your surgical incision.  Otherwise you may continue to increase activities without any particular limitations.  Increase the intensity and duration of physical activity gradually.  You are encouraged to enroll and participate in the outpatient cardiac rehab program beginning as soon as practical.  You may return to driving an automobile as long as you are no longer requiring oral narcotic pain relievers during the daytime.  It would be wise to start driving only short distances during the daylight and gradually increase from there as you feel comfortable.  Endocarditis is a potentially serious infection of heart valves or inside lining of the heart.  It occurs more commonly in patients with diseased heart valves (such as patient's with aortic or mitral valve disease) and in patients who have undergone heart valve repair or replacement.  Certain surgical and dental procedures may put you at risk, such as dental cleaning, other dental procedures, or any surgery involving the respiratory, urinary, gastrointestinal tract, gallbladder or prostate gland.   To minimize your chances for develooping endocarditis, maintain good oral health and seek prompt medical attention for any infections involving the mouth, teeth, gums, skin or urinary tract.    Always notify your doctor or dentist about your underlying heart valve condition before having any invasive procedures. You will need to take antibiotics before certain procedures, including all routine dental cleanings or other dental procedures.  Your cardiologist or dentist should prescribe these antibiotics for you to  be taken ahead of time.

## 2019-06-28 DIAGNOSIS — R9431 Abnormal electrocardiogram [ECG] [EKG]: Secondary | ICD-10-CM | POA: Diagnosis not present

## 2019-06-28 DIAGNOSIS — Z954 Presence of other heart-valve replacement: Secondary | ICD-10-CM | POA: Diagnosis not present

## 2019-06-30 DIAGNOSIS — R9431 Abnormal electrocardiogram [ECG] [EKG]: Secondary | ICD-10-CM | POA: Diagnosis not present

## 2019-06-30 DIAGNOSIS — Z954 Presence of other heart-valve replacement: Secondary | ICD-10-CM | POA: Diagnosis not present

## 2019-07-04 ENCOUNTER — Other Ambulatory Visit: Payer: Self-pay

## 2019-07-04 ENCOUNTER — Ambulatory Visit (INDEPENDENT_AMBULATORY_CARE_PROVIDER_SITE_OTHER): Payer: BC Managed Care – PPO | Admitting: *Deleted

## 2019-07-04 DIAGNOSIS — I099 Rheumatic heart disease, unspecified: Secondary | ICD-10-CM

## 2019-07-04 DIAGNOSIS — Z7901 Long term (current) use of anticoagulants: Secondary | ICD-10-CM | POA: Diagnosis not present

## 2019-07-04 DIAGNOSIS — I480 Paroxysmal atrial fibrillation: Secondary | ICD-10-CM | POA: Diagnosis not present

## 2019-07-04 DIAGNOSIS — Z954 Presence of other heart-valve replacement: Secondary | ICD-10-CM | POA: Diagnosis not present

## 2019-07-04 DIAGNOSIS — R9431 Abnormal electrocardiogram [ECG] [EKG]: Secondary | ICD-10-CM | POA: Diagnosis not present

## 2019-07-04 DIAGNOSIS — Z5181 Encounter for therapeutic drug level monitoring: Secondary | ICD-10-CM

## 2019-07-04 LAB — POCT INR: INR: 1.9 — AB (ref 2.0–3.0)

## 2019-07-04 NOTE — Patient Instructions (Signed)
Description   Today take 10mg , then change your dose to 7.5mg  daily except 10mg  on Tuesdays and Saturdays.   Recheck in 1 week.  Call  office with any medication changes or additions [phone].

## 2019-07-05 DIAGNOSIS — Z954 Presence of other heart-valve replacement: Secondary | ICD-10-CM | POA: Diagnosis not present

## 2019-07-05 DIAGNOSIS — R9431 Abnormal electrocardiogram [ECG] [EKG]: Secondary | ICD-10-CM | POA: Diagnosis not present

## 2019-07-07 DIAGNOSIS — Z954 Presence of other heart-valve replacement: Secondary | ICD-10-CM | POA: Diagnosis not present

## 2019-07-07 DIAGNOSIS — R9431 Abnormal electrocardiogram [ECG] [EKG]: Secondary | ICD-10-CM | POA: Diagnosis not present

## 2019-07-10 DIAGNOSIS — Z954 Presence of other heart-valve replacement: Secondary | ICD-10-CM | POA: Diagnosis not present

## 2019-07-10 DIAGNOSIS — R9431 Abnormal electrocardiogram [ECG] [EKG]: Secondary | ICD-10-CM | POA: Diagnosis not present

## 2019-07-11 ENCOUNTER — Ambulatory Visit (INDEPENDENT_AMBULATORY_CARE_PROVIDER_SITE_OTHER): Payer: BC Managed Care – PPO | Admitting: Pharmacist Clinician (PhC)/ Clinical Pharmacy Specialist

## 2019-07-11 ENCOUNTER — Other Ambulatory Visit: Payer: Self-pay

## 2019-07-11 DIAGNOSIS — I480 Paroxysmal atrial fibrillation: Secondary | ICD-10-CM | POA: Diagnosis not present

## 2019-07-11 DIAGNOSIS — Z7901 Long term (current) use of anticoagulants: Secondary | ICD-10-CM | POA: Diagnosis not present

## 2019-07-11 DIAGNOSIS — I099 Rheumatic heart disease, unspecified: Secondary | ICD-10-CM | POA: Diagnosis not present

## 2019-07-11 DIAGNOSIS — Z5181 Encounter for therapeutic drug level monitoring: Secondary | ICD-10-CM | POA: Diagnosis not present

## 2019-07-11 LAB — POCT INR: INR: 1.6 — AB (ref 2.0–3.0)

## 2019-07-12 DIAGNOSIS — R9431 Abnormal electrocardiogram [ECG] [EKG]: Secondary | ICD-10-CM | POA: Diagnosis not present

## 2019-07-12 DIAGNOSIS — Z954 Presence of other heart-valve replacement: Secondary | ICD-10-CM | POA: Diagnosis not present

## 2019-07-14 DIAGNOSIS — R9431 Abnormal electrocardiogram [ECG] [EKG]: Secondary | ICD-10-CM | POA: Diagnosis not present

## 2019-07-14 DIAGNOSIS — Z954 Presence of other heart-valve replacement: Secondary | ICD-10-CM | POA: Diagnosis not present

## 2019-07-17 DIAGNOSIS — Z954 Presence of other heart-valve replacement: Secondary | ICD-10-CM | POA: Diagnosis not present

## 2019-07-17 DIAGNOSIS — R9431 Abnormal electrocardiogram [ECG] [EKG]: Secondary | ICD-10-CM | POA: Diagnosis not present

## 2019-07-19 DIAGNOSIS — Z954 Presence of other heart-valve replacement: Secondary | ICD-10-CM | POA: Diagnosis not present

## 2019-07-19 DIAGNOSIS — R9431 Abnormal electrocardiogram [ECG] [EKG]: Secondary | ICD-10-CM | POA: Diagnosis not present

## 2019-07-21 DIAGNOSIS — R9431 Abnormal electrocardiogram [ECG] [EKG]: Secondary | ICD-10-CM | POA: Diagnosis not present

## 2019-07-21 DIAGNOSIS — Z954 Presence of other heart-valve replacement: Secondary | ICD-10-CM | POA: Diagnosis not present

## 2019-07-24 DIAGNOSIS — R9431 Abnormal electrocardiogram [ECG] [EKG]: Secondary | ICD-10-CM | POA: Diagnosis not present

## 2019-07-24 DIAGNOSIS — Z954 Presence of other heart-valve replacement: Secondary | ICD-10-CM | POA: Diagnosis not present

## 2019-07-27 DIAGNOSIS — L0100 Impetigo, unspecified: Secondary | ICD-10-CM | POA: Diagnosis not present

## 2019-07-27 DIAGNOSIS — L237 Allergic contact dermatitis due to plants, except food: Secondary | ICD-10-CM | POA: Diagnosis not present

## 2019-07-28 ENCOUNTER — Other Ambulatory Visit: Payer: Self-pay

## 2019-07-28 ENCOUNTER — Encounter: Payer: Self-pay | Admitting: Cardiology

## 2019-07-28 ENCOUNTER — Ambulatory Visit (INDEPENDENT_AMBULATORY_CARE_PROVIDER_SITE_OTHER): Payer: BC Managed Care – PPO | Admitting: Cardiology

## 2019-07-28 VITALS — BP 120/60 | HR 87 | Ht 63.0 in | Wt 138.0 lb

## 2019-07-28 DIAGNOSIS — I099 Rheumatic heart disease, unspecified: Secondary | ICD-10-CM

## 2019-07-28 DIAGNOSIS — Z954 Presence of other heart-valve replacement: Secondary | ICD-10-CM | POA: Diagnosis not present

## 2019-07-28 DIAGNOSIS — Z7901 Long term (current) use of anticoagulants: Secondary | ICD-10-CM

## 2019-07-28 DIAGNOSIS — I480 Paroxysmal atrial fibrillation: Secondary | ICD-10-CM

## 2019-07-28 DIAGNOSIS — R9431 Abnormal electrocardiogram [ECG] [EKG]: Secondary | ICD-10-CM | POA: Diagnosis not present

## 2019-07-28 DIAGNOSIS — Z8679 Personal history of other diseases of the circulatory system: Secondary | ICD-10-CM

## 2019-07-28 DIAGNOSIS — Z9889 Other specified postprocedural states: Secondary | ICD-10-CM | POA: Diagnosis not present

## 2019-07-28 NOTE — Progress Notes (Signed)
Cardiology Office Note:    Date:  07/28/2019   ID:  Melody Torres, DOB [birth year], MRN [id number]  PCP:  Street, Stephanie Coup, MD  Cardiologist:  Gypsy Balsam, MD    Referring MD: Street, Stephanie Coup, *   Chief Complaint  Patient presents with  . Follow-up  Doing much better  History of Present Illness:    Melody Torres is a 63 y.o. female status post minimally invasive mitral valve replacement on 2 months ago secondary to mitral stenosis secondary to rheumatic fever recovering after surgery nicely doing very well with participate in rehab when asked if how she feels compared to herself 6 months ago she said much better she has more energy she like to do line dancing and before she has to stop now she can do it without stopping.  No palpitations no dizziness no passing out no swelling of lower extremities.  Overall does well.  Past Medical History:  Diagnosis Date  . CHF (congestive heart failure) (HCC) 2017  . Diabetes mellitus without complication (HCC)    type 2  . GERD (gastroesophageal reflux disease)   . Hyperlipidemia   . Hyperlipidemia    no meds, diet controlled  . Paroxysmal atrial fibrillation (HCC) 01/20/2016   Overview:  Chads score 2 anticoagulated with warfarin  . Rheumatic heart disease   . Rheumatic mitral regurgitation 01/20/2016   Overview:  Mild to moderate  . S/P Minimally invasive maze operation for atrial fibrillation 05/24/2019   Complete bilateral atrial lesion set using cryothermy and bipolar radiofrequency ablation with clipping of LA appendage via right mini-thoracotomy approach  . S/P minimally invasive mitral valve replacement with mechanical valve 05/24/2019   33 mm Sorin Carbomedics bileaflet mechanical valve via right mini thoracotomy approach  . Severe mitral valve stenosis 01/20/2016  . Tricuspid regurgitation     Past Surgical History:  Procedure Laterality Date  . CLIPPING OF ATRIAL APPENDAGE N/A 05/24/2019   Procedure:  Clipping Of Atrial Appendage Using AtriCure PRO2 clip size 75mm;  Surgeon: Purcell Nails, MD;  Location: [facility] OR;  Service: Open Heart Surgery;  Laterality: N/A;  . MINIMALLY INVASIVE MAZE PROCEDURE N/A 05/24/2019   Procedure: MINIMALLY INVASIVE MAZE PROCEDURE;  Surgeon: Purcell Nails, MD;  Location[facility] OR;  Service: Open Heart Surgery;  Laterality: N/A;  . MITRAL VALVE REPLACEMENT Right 05/24/2019   Procedure: MINIMALLY INVASIVE MITRAL VALVE (MV) REPLACEMENT Using Carbomedics Optiform Heart Valve Size 67mm;  Surgeon: Purcell Nails, MD;  Location: [facility] OR;  Service: Open Heart Surgery;  Laterality: Right;  . RIGHT/LEFT HEART CATH AND CORONARY ANGIOGRAPHY N/A 04/07/2019   Procedure: RIGHT/LEFT HEART CATH AND CORONARY ANGIOGRAPHY;  Surgeon: Marykay Lex, MD;  Location: [facility] INVASIVE CV LAB;  Service: Cardiovascular;  Laterality: N/A;  . TEE WITHOUT CARDIOVERSION N/A 01/06/2019   Procedure: TRANSESOPHAGEAL ECHOCARDIOGRAM (TEE);  Surgeon: Lars Masson, MD;  Location: [facility] ENDOSCOPY;  Service: Cardiovascular;  Laterality: N/A;  . TEE WITHOUT CARDIOVERSION N/A 05/24/2019   Procedure: TRANSESOPHAGEAL ECHOCARDIOGRAM (TEE);  Surgeon: Purcell Nails, MD;  Location: [facility] OR;  Service: Open Heart Surgery;  Laterality: N/A;  . WISDOM TOOTH EXTRACTION      Current Medications: Current Meds  Medication Sig  . aspirin EC 81 MG EC tablet Take 1 tablet (81 mg total) by mouth daily.  . Chromium (CHROMEMATE PO) Take 600 mg by mouth daily.   . clobetasol cream (TEMOVATE) 0.05 % Apply 1 application topically 2 (two) times daily as needed (for lichen  sclerosus).   Marland Kitchen. JANUVIA 100 MG tablet Take 100 mg by mouth every evening.   . loratadine (CLARITIN) 10 MG tablet Take 10 mg by mouth daily.  . MULTAQ 400 MG tablet TAKE 1 TABLET BY MOUTH TWICE (2) DAILY (Patient taking differently: Take 400 mg by mouth 2 (two) times daily with a meal. )  . omeprazole (PRILOSEC) 20 MG capsule Take 20 mg by mouth daily as needed  (for acid reflux or heartburn).   . pravastatin (PRAVACHOL) 40 MG tablet TAKE 1 TABLET BY MOUTH DAILY WITH SUPPER. *NEED APPOINTMENT FOR FURTHER REFILLS*  . triamcinolone ointment (KENALOG) 0.1 % Apply topically 2 (two) times daily.  Marland Kitchen. warfarin (COUMADIN) 5 MG tablet Take 1.5 tablets (7.5 mg total) by mouth every evening.     Allergies:   Adhesive [tape] and Penicillins   Social History   Socioeconomic History  . Marital status: Widowed    Spouse name: Not on file  . Number of children: Not on file  . Years of education: Not on file  . Highest education level: Not on file  Occupational History  . Not on file  Social Needs  . Financial resource strain: Not on file  . Food insecurity    Worry: Not on file    Inability: Not on file  . Transportation needs    Medical: Not on file    Non-medical: Not on file  Tobacco Use  . Smoking status: Never Smoker  . Smokeless tobacco: Never Used  Substance and Sexual Activity  . Alcohol use: No  . Drug use: No  . Sexual activity: Not on file  Lifestyle  . Physical activity    Days per week: Not on file    Minutes per session: Not on file  . Stress: Not on file  Relationships  . Social Musicianconnections    Talks on phone: Not on file    Gets together: Not on file    Attends religious service: Not on file    Active member of club or organization: Not on file    Attends meetings of clubs or organizations: Not on file    Relationship status: Not on file  Other Topics Concern  . Not on file  Social History Narrative  . Not on file     Family History: The patient's family history includes Bladder Cancer in her mother; Diabetes in her father; Heart attack in her mother; Heart disease in her father; Hypothyroidism in her mother; Stroke in her father. ROS:   Please see the history of present illness.    All 14 point review of systems negative except as described per history of present illness  EKGs/Labs/Other Studies Reviewed:       Recent Labs: 05/24/2019: ALT 35 05/25/2019: Magnesium 2.4 05/29/2019: BUN 21; Creatinine, Ser 1.08; Hemoglobin 11.0; Platelets 182; Potassium 3.5; Sodium 135  Recent Lipid Panel    Component Value Date/Time   CHOL 140 04/29/2017 1656   TRIG 232 (H) 04/29/2017 1656   HDL 37 (L) 04/29/2017 1656   CHOLHDL 3.8 04/29/2017 1656   LDLCALC 57 04/29/2017 1656    Physical Exam:    VS:  BP 120/60   Pulse 87   Ht 5\' 3"  (1.6 m)   Wt 138 lb (62.6 kg)   LMP  (LMP Unknown)   SpO2 98%   BMI 24.45 kg/m     Wt Readings from Last 3 Encounters:  07/28/19 138 lb (62.6 kg)  06/26/19 134 lb (60.8 kg)  06/15/19  133 lb 12.8 oz (60.7 kg)     GEN:  Well nourished, well developed in no acute distress HEENT: Normal NECK: No JVD; No carotid bruits LYMPHATICS: No lymphadenopathy CARDIAC irregular, crisp mechanical valve sounds, no rubs, no gallops RESPIRATORY:  Clear to auscultation without rales, wheezing or rhonchi  ABDOMEN: Soft, non-tender, non-distended MUSCULOSKELETAL:  No edema; No deformity  SKIN: Warm and dry LOWER EXTREMITIES: no swelling NEUROLOGIC:  Alert and oriented x 3 PSYCHIATRIC:  Normal affect   ASSESSMENT:    1. S/P minimally invasive mitral valve replacement with mechanical valve    2. Rheumatic heart disease   3. Paroxysmal atrial fibrillation (HCC)   4. S/P Minimally invasive maze operation for atrial fibrillation   5. Long term (current) use of anticoagulants [Z79.01]    PLAN:    In order of problems listed above:  1. Status post minimally invasive mitral valve replacement.  Doing well from that point we will continue monitoring 2. History of rheumatic fever.  Noted 3. Paroxysmal atrial fibrillation her heart rate is irregular today we will do EKG to check the rhythm.  If she is in atrial fibrillation we will continue with Multaq and I bring her back next week for another EKG at that time we will make a decision which way to proceed.  She may require cardioversion 4.  Status post minimally invasive maze procedure.  Again we will check for atrial fibrillation 5. Long-term to coagulation.  Noted continue monitoring   Medication Adjustments/Labs and Tests Ordered: Current medicines are reviewed at length with the patient today.  Concerns regarding medicines are outlined above.  No orders of the defined types were placed in this encounter.  Medication changes: No orders of the defined types were placed in this encounter.   Signed, Park Liter, MD, [facility] 07/28/2019 2:45 PM    [place]

## 2019-07-28 NOTE — Patient Instructions (Signed)
Medication Instructions:  Your physician recommends that you continue on your current medications as directed. Please refer to the Current Medication list given to you today.  If you need a refill on your cardiac medications before your next appointment, please call your pharmacy.   Lab work: None ordered If you have labs (blood work) drawn today and your tests are completely normal, you will receive your results only by: Marland Kitchen MyChart Message (if you have MyChart) OR . A paper copy in the mail If you have any lab test that is abnormal or we need to change your treatment, we will call you to review the results.  Testing/Procedures: Your physician has recommended that you wear a holter monitor. Holter monitors are medical devices that record the heart's electrical activity. Doctors most often use these monitors to diagnose arrhythmias. Arrhythmias are problems with the speed or rhythm of the heartbeat. The monitor is a small, portable device. You can wear one while you do your normal daily activities. This is usually used to diagnose what is causing palpitations/syncope (passing out). You will wear this for 14 days.  EKG Today  Follow-Up: At [facility], you and your health needs are our priority.  As part of our continuing mission to provide you with exceptional heart care, we have created designated Provider Care Teams.  These Care Teams include your primary Cardiologist (physician) and Advanced Practice Providers (APPs -  Physician Assistants and Nurse Practitioners) who all work together to provide you with the care you need, when you need it. You will need a follow up appointment in 3 months. You may see Jenne Campus, MD or another member of our [place] Provider Team in [place]: Shirlee More, MD . Jyl Heinz, MD

## 2019-07-29 ENCOUNTER — Other Ambulatory Visit: Payer: Self-pay | Admitting: Cardiology

## 2019-07-31 DIAGNOSIS — Z954 Presence of other heart-valve replacement: Secondary | ICD-10-CM | POA: Diagnosis not present

## 2019-07-31 DIAGNOSIS — R9431 Abnormal electrocardiogram [ECG] [EKG]: Secondary | ICD-10-CM | POA: Diagnosis not present

## 2019-07-31 DIAGNOSIS — L255 Unspecified contact dermatitis due to plants, except food: Secondary | ICD-10-CM | POA: Diagnosis not present

## 2019-08-01 ENCOUNTER — Other Ambulatory Visit: Payer: Self-pay

## 2019-08-01 ENCOUNTER — Ambulatory Visit (INDEPENDENT_AMBULATORY_CARE_PROVIDER_SITE_OTHER): Payer: BC Managed Care – PPO | Admitting: Pharmacist Clinician (PhC)/ Clinical Pharmacy Specialist

## 2019-08-01 DIAGNOSIS — I099 Rheumatic heart disease, unspecified: Secondary | ICD-10-CM | POA: Diagnosis not present

## 2019-08-01 DIAGNOSIS — Z7901 Long term (current) use of anticoagulants: Secondary | ICD-10-CM | POA: Diagnosis not present

## 2019-08-01 DIAGNOSIS — I480 Paroxysmal atrial fibrillation: Secondary | ICD-10-CM

## 2019-08-01 DIAGNOSIS — Z5181 Encounter for therapeutic drug level monitoring: Secondary | ICD-10-CM | POA: Diagnosis not present

## 2019-08-01 LAB — POCT INR: INR: 1.9 — AB (ref 2.0–3.0)

## 2019-08-02 DIAGNOSIS — R9431 Abnormal electrocardiogram [ECG] [EKG]: Secondary | ICD-10-CM | POA: Diagnosis not present

## 2019-08-02 DIAGNOSIS — Z954 Presence of other heart-valve replacement: Secondary | ICD-10-CM | POA: Diagnosis not present

## 2019-08-04 DIAGNOSIS — R9431 Abnormal electrocardiogram [ECG] [EKG]: Secondary | ICD-10-CM | POA: Diagnosis not present

## 2019-08-04 DIAGNOSIS — Z954 Presence of other heart-valve replacement: Secondary | ICD-10-CM | POA: Diagnosis not present

## 2019-08-09 DIAGNOSIS — Z954 Presence of other heart-valve replacement: Secondary | ICD-10-CM | POA: Diagnosis not present

## 2019-08-09 DIAGNOSIS — R9431 Abnormal electrocardiogram [ECG] [EKG]: Secondary | ICD-10-CM | POA: Diagnosis not present

## 2019-08-11 DIAGNOSIS — R9431 Abnormal electrocardiogram [ECG] [EKG]: Secondary | ICD-10-CM | POA: Diagnosis not present

## 2019-08-11 DIAGNOSIS — Z954 Presence of other heart-valve replacement: Secondary | ICD-10-CM | POA: Diagnosis not present

## 2019-08-14 DIAGNOSIS — Z954 Presence of other heart-valve replacement: Secondary | ICD-10-CM | POA: Diagnosis not present

## 2019-08-14 DIAGNOSIS — R9431 Abnormal electrocardiogram [ECG] [EKG]: Secondary | ICD-10-CM | POA: Diagnosis not present

## 2019-08-15 ENCOUNTER — Other Ambulatory Visit: Payer: Self-pay

## 2019-08-15 ENCOUNTER — Ambulatory Visit (INDEPENDENT_AMBULATORY_CARE_PROVIDER_SITE_OTHER): Payer: BC Managed Care – PPO | Admitting: Pharmacist Clinician (PhC)/ Clinical Pharmacy Specialist

## 2019-08-15 DIAGNOSIS — Z7901 Long term (current) use of anticoagulants: Secondary | ICD-10-CM

## 2019-08-15 DIAGNOSIS — Z5181 Encounter for therapeutic drug level monitoring: Secondary | ICD-10-CM

## 2019-08-15 DIAGNOSIS — I099 Rheumatic heart disease, unspecified: Secondary | ICD-10-CM | POA: Diagnosis not present

## 2019-08-15 DIAGNOSIS — I480 Paroxysmal atrial fibrillation: Secondary | ICD-10-CM | POA: Diagnosis not present

## 2019-08-15 LAB — POCT INR: INR: 2.2 (ref 2.0–3.0)

## 2019-08-16 DIAGNOSIS — M545 Low back pain: Secondary | ICD-10-CM | POA: Diagnosis not present

## 2019-08-16 DIAGNOSIS — R9431 Abnormal electrocardiogram [ECG] [EKG]: Secondary | ICD-10-CM | POA: Diagnosis not present

## 2019-08-16 DIAGNOSIS — Z954 Presence of other heart-valve replacement: Secondary | ICD-10-CM | POA: Diagnosis not present

## 2019-08-16 DIAGNOSIS — Z7901 Long term (current) use of anticoagulants: Secondary | ICD-10-CM | POA: Diagnosis not present

## 2019-08-16 DIAGNOSIS — R319 Hematuria, unspecified: Secondary | ICD-10-CM | POA: Diagnosis not present

## 2019-08-21 ENCOUNTER — Ambulatory Visit: Payer: BC Managed Care – PPO | Admitting: Thoracic Surgery (Cardiothoracic Vascular Surgery)

## 2019-08-22 DIAGNOSIS — M546 Pain in thoracic spine: Secondary | ICD-10-CM | POA: Insufficient documentation

## 2019-08-22 HISTORY — DX: Pain in thoracic spine: M54.6

## 2019-08-23 DIAGNOSIS — Z954 Presence of other heart-valve replacement: Secondary | ICD-10-CM | POA: Diagnosis not present

## 2019-08-23 DIAGNOSIS — R9431 Abnormal electrocardiogram [ECG] [EKG]: Secondary | ICD-10-CM | POA: Diagnosis not present

## 2019-08-25 DIAGNOSIS — Z954 Presence of other heart-valve replacement: Secondary | ICD-10-CM | POA: Diagnosis not present

## 2019-08-25 DIAGNOSIS — Z23 Encounter for immunization: Secondary | ICD-10-CM | POA: Diagnosis not present

## 2019-08-25 DIAGNOSIS — R9431 Abnormal electrocardiogram [ECG] [EKG]: Secondary | ICD-10-CM | POA: Diagnosis not present

## 2019-08-28 ENCOUNTER — Encounter: Payer: Self-pay | Admitting: Thoracic Surgery (Cardiothoracic Vascular Surgery)

## 2019-08-28 ENCOUNTER — Other Ambulatory Visit: Payer: Self-pay

## 2019-08-28 ENCOUNTER — Other Ambulatory Visit: Payer: Self-pay | Admitting: Thoracic Surgery (Cardiothoracic Vascular Surgery)

## 2019-08-28 ENCOUNTER — Other Ambulatory Visit: Payer: Self-pay | Admitting: Cardiology

## 2019-08-28 ENCOUNTER — Ambulatory Visit (INDEPENDENT_AMBULATORY_CARE_PROVIDER_SITE_OTHER): Payer: BC Managed Care – PPO | Admitting: Thoracic Surgery (Cardiothoracic Vascular Surgery)

## 2019-08-28 VITALS — BP 118/73 | HR 90 | Temp 97.8°F | Resp 20 | Ht 63.0 in | Wt 138.0 lb

## 2019-08-28 DIAGNOSIS — R9431 Abnormal electrocardiogram [ECG] [EKG]: Secondary | ICD-10-CM | POA: Diagnosis not present

## 2019-08-28 DIAGNOSIS — Z9889 Other specified postprocedural states: Secondary | ICD-10-CM

## 2019-08-28 DIAGNOSIS — Z8679 Personal history of other diseases of the circulatory system: Secondary | ICD-10-CM | POA: Diagnosis not present

## 2019-08-28 DIAGNOSIS — Z954 Presence of other heart-valve replacement: Secondary | ICD-10-CM | POA: Diagnosis not present

## 2019-08-28 NOTE — Patient Instructions (Addendum)
Continue all previous medications without any changes at this time  You may resume unrestricted physical activity without any particular limitations at this time.   

## 2019-08-28 NOTE — Progress Notes (Signed)
[address]       [place] [address]             [phone]     CARDIOTHORACIC SURGERY OFFICE NOTE  Referring Provider is Georgeanna Lea, MD PCP is Street, Stephanie Coup, MD   HPI:  Patient is a 63 year old female with longstanding rheumatic heart disease who returns to the office today for routine follow-up status post minimally invasive mitral valve replacement using a mechanical prosthetic valve and maze procedure on May 24, 2023 severe mitral stenosis, mitral regurgitation, chronic diastolic congestive heart failure, and longstanding persistent atrial fibrillation.  She was last seen here in the office on June 26, 2019 at which time she was doing fairly well.  Since then she has continued to recover uneventfully.  She was seen in follow-up by Dr. Bing Matter on July 28, 2019 at which time she was maintaining sinus rhythm.  She returns her office today and reports that she continues to do very well from a cardiac standpoint.  She does state that she hurt her back recently and was diagnosed with a minor nondisplaced compression fracture of her thoracic spine.  She otherwise is doing well.  She denies any significant shortness of breath.  She has not had any palpitations.  She continues to get her prothrombin time checked and Coumadin dose adjusted.  Her most recent INR was slightly subtherapeutic at 2.2.   Current Outpatient Medications  Medication Sig Dispense Refill  . aspirin EC 81 MG EC tablet Take 1 tablet (81 mg total) by mouth daily.    . Chromium (CHROMEMATE PO) Take 600 mg by mouth daily.     . clobetasol cream (TEMOVATE) 0.05 % Apply 1 application topically 2 (two) times daily as needed (for lichen sclerosus).     Marland Kitchen JANUVIA 100 MG tablet Take 100 mg by mouth every evening.   11  . loratadine (CLARITIN) 10 MG tablet Take 10 mg by mouth daily.    . MULTAQ 400 MG tablet TAKE 1 TABLET BY MOUTH TWICE (2) DAILY 60 tablet 4  . omeprazole (PRILOSEC) 20  MG capsule Take 20 mg by mouth daily as needed (for acid reflux or heartburn).     . pravastatin (PRAVACHOL) 40 MG tablet TAKE 1 TABLET BY MOUTH ONCE (1) DAILY WITH SUPPER 90 tablet 1  . warfarin (COUMADIN) 5 MG tablet Take 1.5 tablets (7.5 mg total) by mouth every evening. 140 tablet 0  . triamcinolone ointment (KENALOG) 0.1 % Apply topically 2 (two) times daily.     No current facility-administered medications for this visit.       Physical Exam:   BP 118/73   Pulse 90   Temp 97.8 F (36.6 C) (Skin)   Resp 20   Ht 5\' 3"  (1.6 m)   Wt 138 lb (62.6 kg)   LMP  (LMP Unknown)   SpO2 97%   BMI 24.45 kg/m   General:  Well-appearing  Chest:   Clear to auscultation  CV:   Regular rate and rhythm with mechanical heart valve sounds  Incisions:  Completely healed  Abdomen:  Soft nontender  Extremities:  Warm and well-perfused  Diagnostic Tests:  2 channel telemetry rhythm strip demonstrates normal sinus rhythm   Impression:  Patient is doing well and maintaining sinus rhythm approximately 3 months status post minimally invasive mitral valve replacement using a mechanical prosthetic valve and maze procedure.    Plan:  We have not recommended any change  the patient's current medications.  It might be reasonable to consider stopping Multaq at some point in the not too distant future if she continues to maintain sinus rhythm.  I have encouraged the patient to continue to gradually increase her physical activity without any particular limitations.  The patient has been reminded regarding the importance of dental hygiene and the lifelong need for antibiotic prophylaxis for all dental cleanings and other related invasive procedures.  The patient will continue to follow-up regularly with Dr. Agustin Cree.  We will ask that a routine follow-up echocardiogram be performed.  She will return to our office next summer for routine follow-up and rhythm check.  She will call and return sooner should  specific problems or questions arise.  I spent in excess of 15 minutes during the conduct of this office consultation and >50% of this time involved direct face-to-face encounter with the patient for counseling and/or coordination of their care.   Valentina Gu. Roxy Manns, MD 08/28/2019 2:27 PM

## 2019-08-29 ENCOUNTER — Ambulatory Visit (INDEPENDENT_AMBULATORY_CARE_PROVIDER_SITE_OTHER): Payer: BC Managed Care – PPO | Admitting: *Deleted

## 2019-08-29 DIAGNOSIS — I099 Rheumatic heart disease, unspecified: Secondary | ICD-10-CM

## 2019-08-29 DIAGNOSIS — Z7901 Long term (current) use of anticoagulants: Secondary | ICD-10-CM

## 2019-08-29 DIAGNOSIS — I480 Paroxysmal atrial fibrillation: Secondary | ICD-10-CM

## 2019-08-29 DIAGNOSIS — Z5181 Encounter for therapeutic drug level monitoring: Secondary | ICD-10-CM | POA: Diagnosis not present

## 2019-08-29 LAB — POCT INR: INR: 2.3 (ref 2.0–3.0)

## 2019-08-29 NOTE — Patient Instructions (Signed)
Increase dose to 2 tablets daily except 1 1/2 tablets on Mondays.   Recheck in 2 week.  Call  office with any medication changes or additions [phone].

## 2019-08-30 DIAGNOSIS — Z954 Presence of other heart-valve replacement: Secondary | ICD-10-CM | POA: Diagnosis not present

## 2019-08-30 DIAGNOSIS — R9431 Abnormal electrocardiogram [ECG] [EKG]: Secondary | ICD-10-CM | POA: Diagnosis not present

## 2019-09-01 DIAGNOSIS — Z954 Presence of other heart-valve replacement: Secondary | ICD-10-CM | POA: Diagnosis not present

## 2019-09-01 DIAGNOSIS — R9431 Abnormal electrocardiogram [ECG] [EKG]: Secondary | ICD-10-CM | POA: Diagnosis not present

## 2019-09-04 DIAGNOSIS — Z954 Presence of other heart-valve replacement: Secondary | ICD-10-CM | POA: Diagnosis not present

## 2019-09-04 DIAGNOSIS — R9431 Abnormal electrocardiogram [ECG] [EKG]: Secondary | ICD-10-CM | POA: Diagnosis not present

## 2019-09-06 DIAGNOSIS — Z954 Presence of other heart-valve replacement: Secondary | ICD-10-CM | POA: Diagnosis not present

## 2019-09-06 DIAGNOSIS — R9431 Abnormal electrocardiogram [ECG] [EKG]: Secondary | ICD-10-CM | POA: Diagnosis not present

## 2019-09-08 DIAGNOSIS — R9431 Abnormal electrocardiogram [ECG] [EKG]: Secondary | ICD-10-CM | POA: Diagnosis not present

## 2019-09-08 DIAGNOSIS — Z954 Presence of other heart-valve replacement: Secondary | ICD-10-CM | POA: Diagnosis not present

## 2019-09-08 DIAGNOSIS — M4854XA Collapsed vertebra, not elsewhere classified, thoracic region, initial encounter for fracture: Secondary | ICD-10-CM

## 2019-09-08 HISTORY — DX: Collapsed vertebra, not elsewhere classified, thoracic region, initial encounter for fracture: M48.54XA

## 2019-09-11 DIAGNOSIS — R9431 Abnormal electrocardiogram [ECG] [EKG]: Secondary | ICD-10-CM | POA: Diagnosis not present

## 2019-09-11 DIAGNOSIS — Z954 Presence of other heart-valve replacement: Secondary | ICD-10-CM | POA: Diagnosis not present

## 2019-09-12 ENCOUNTER — Ambulatory Visit (INDEPENDENT_AMBULATORY_CARE_PROVIDER_SITE_OTHER): Payer: BC Managed Care – PPO | Admitting: *Deleted

## 2019-09-12 ENCOUNTER — Other Ambulatory Visit: Payer: Self-pay

## 2019-09-12 ENCOUNTER — Ambulatory Visit (INDEPENDENT_AMBULATORY_CARE_PROVIDER_SITE_OTHER): Payer: BC Managed Care – PPO

## 2019-09-12 DIAGNOSIS — S22080D Wedge compression fracture of T11-T12 vertebra, subsequent encounter for fracture with routine healing: Secondary | ICD-10-CM | POA: Diagnosis not present

## 2019-09-12 DIAGNOSIS — I480 Paroxysmal atrial fibrillation: Secondary | ICD-10-CM | POA: Diagnosis not present

## 2019-09-12 DIAGNOSIS — I099 Rheumatic heart disease, unspecified: Secondary | ICD-10-CM | POA: Diagnosis not present

## 2019-09-12 DIAGNOSIS — Z7901 Long term (current) use of anticoagulants: Secondary | ICD-10-CM | POA: Diagnosis not present

## 2019-09-12 DIAGNOSIS — Z5181 Encounter for therapeutic drug level monitoring: Secondary | ICD-10-CM | POA: Diagnosis not present

## 2019-09-12 LAB — POCT INR: INR: 1.9 — AB (ref 2.0–3.0)

## 2019-09-12 NOTE — Patient Instructions (Signed)
Increase dose to 2 tablets daily except 3 tablets on Tuesdays.   Recheck in 1 week.  Call  office with any medication changes or additions [phone].

## 2019-09-13 DIAGNOSIS — Z954 Presence of other heart-valve replacement: Secondary | ICD-10-CM | POA: Diagnosis not present

## 2019-09-13 DIAGNOSIS — R9431 Abnormal electrocardiogram [ECG] [EKG]: Secondary | ICD-10-CM | POA: Diagnosis not present

## 2019-09-15 DIAGNOSIS — Z954 Presence of other heart-valve replacement: Secondary | ICD-10-CM | POA: Diagnosis not present

## 2019-09-15 DIAGNOSIS — R9431 Abnormal electrocardiogram [ECG] [EKG]: Secondary | ICD-10-CM | POA: Diagnosis not present

## 2019-09-19 ENCOUNTER — Other Ambulatory Visit: Payer: Self-pay

## 2019-09-19 ENCOUNTER — Ambulatory Visit (INDEPENDENT_AMBULATORY_CARE_PROVIDER_SITE_OTHER): Payer: BC Managed Care – PPO | Admitting: *Deleted

## 2019-09-19 DIAGNOSIS — I099 Rheumatic heart disease, unspecified: Secondary | ICD-10-CM

## 2019-09-19 DIAGNOSIS — I480 Paroxysmal atrial fibrillation: Secondary | ICD-10-CM | POA: Diagnosis not present

## 2019-09-19 DIAGNOSIS — Z5181 Encounter for therapeutic drug level monitoring: Secondary | ICD-10-CM | POA: Diagnosis not present

## 2019-09-19 DIAGNOSIS — Z6823 Body mass index (BMI) 23.0-23.9, adult: Secondary | ICD-10-CM | POA: Diagnosis not present

## 2019-09-19 DIAGNOSIS — Z01419 Encounter for gynecological examination (general) (routine) without abnormal findings: Secondary | ICD-10-CM | POA: Diagnosis not present

## 2019-09-19 DIAGNOSIS — Z7901 Long term (current) use of anticoagulants: Secondary | ICD-10-CM | POA: Diagnosis not present

## 2019-09-19 DIAGNOSIS — N3941 Urge incontinence: Secondary | ICD-10-CM | POA: Diagnosis not present

## 2019-09-19 DIAGNOSIS — N959 Unspecified menopausal and perimenopausal disorder: Secondary | ICD-10-CM | POA: Diagnosis not present

## 2019-09-19 LAB — POCT INR: INR: 3 (ref 2.0–3.0)

## 2019-09-19 NOTE — Patient Instructions (Addendum)
Description   Continue same dose to 2 tablets daily except 3 tablets on Tuesdays.   Recheck in 2 week.  Call  office with any medication changes or additions [phone].

## 2019-09-26 DIAGNOSIS — I480 Paroxysmal atrial fibrillation: Secondary | ICD-10-CM | POA: Diagnosis not present

## 2019-09-29 ENCOUNTER — Other Ambulatory Visit: Payer: Self-pay | Admitting: Cardiology

## 2019-10-03 ENCOUNTER — Ambulatory Visit (INDEPENDENT_AMBULATORY_CARE_PROVIDER_SITE_OTHER): Payer: BC Managed Care – PPO | Admitting: *Deleted

## 2019-10-03 ENCOUNTER — Other Ambulatory Visit: Payer: Self-pay

## 2019-10-03 DIAGNOSIS — Z5181 Encounter for therapeutic drug level monitoring: Secondary | ICD-10-CM

## 2019-10-03 DIAGNOSIS — I480 Paroxysmal atrial fibrillation: Secondary | ICD-10-CM | POA: Diagnosis not present

## 2019-10-03 DIAGNOSIS — I099 Rheumatic heart disease, unspecified: Secondary | ICD-10-CM | POA: Diagnosis not present

## 2019-10-03 DIAGNOSIS — Z7901 Long term (current) use of anticoagulants: Secondary | ICD-10-CM

## 2019-10-03 LAB — POCT INR: INR: 4.2 — AB (ref 2.0–3.0)

## 2019-10-03 NOTE — Patient Instructions (Signed)
Hold coumadin tonight then resume 2 tablets daily except 3 tablets on Tuesdays.   Recheck in 2 week.  Call  office with any medication changes or additions [phone].

## 2019-10-04 DIAGNOSIS — Z780 Asymptomatic menopausal state: Secondary | ICD-10-CM | POA: Diagnosis not present

## 2019-10-04 DIAGNOSIS — M8589 Other specified disorders of bone density and structure, multiple sites: Secondary | ICD-10-CM | POA: Diagnosis not present

## 2019-10-16 ENCOUNTER — Other Ambulatory Visit: Payer: Self-pay

## 2019-10-16 ENCOUNTER — Ambulatory Visit (INDEPENDENT_AMBULATORY_CARE_PROVIDER_SITE_OTHER): Payer: BC Managed Care – PPO

## 2019-10-16 DIAGNOSIS — Z9889 Other specified postprocedural states: Secondary | ICD-10-CM | POA: Diagnosis not present

## 2019-10-16 DIAGNOSIS — Z8679 Personal history of other diseases of the circulatory system: Secondary | ICD-10-CM | POA: Diagnosis not present

## 2019-10-16 NOTE — Progress Notes (Signed)
Complete echocardiogram has been performed.  Jimmy Kamalani Mastro RDCS, RVT 

## 2019-10-23 DIAGNOSIS — D72829 Elevated white blood cell count, unspecified: Secondary | ICD-10-CM | POA: Diagnosis not present

## 2019-10-23 DIAGNOSIS — E1169 Type 2 diabetes mellitus with other specified complication: Secondary | ICD-10-CM | POA: Diagnosis not present

## 2019-10-24 ENCOUNTER — Ambulatory Visit (INDEPENDENT_AMBULATORY_CARE_PROVIDER_SITE_OTHER): Payer: BC Managed Care – PPO | Admitting: *Deleted

## 2019-10-24 ENCOUNTER — Other Ambulatory Visit: Payer: Self-pay

## 2019-10-24 DIAGNOSIS — Z5181 Encounter for therapeutic drug level monitoring: Secondary | ICD-10-CM | POA: Diagnosis not present

## 2019-10-24 DIAGNOSIS — I099 Rheumatic heart disease, unspecified: Secondary | ICD-10-CM

## 2019-10-24 DIAGNOSIS — I480 Paroxysmal atrial fibrillation: Secondary | ICD-10-CM

## 2019-10-24 DIAGNOSIS — Z7901 Long term (current) use of anticoagulants: Secondary | ICD-10-CM

## 2019-10-24 LAB — POCT INR: INR: 2.6 (ref 2.0–3.0)

## 2019-10-24 NOTE — Patient Instructions (Signed)
Continue warfarin 2 tablets daily except 3 tablets on Tuesdays.   Recheck in 3 week.  Call  office with any medication changes or additions [phone].

## 2019-10-25 DIAGNOSIS — M791 Myalgia, unspecified site: Secondary | ICD-10-CM | POA: Diagnosis not present

## 2019-10-25 DIAGNOSIS — E1169 Type 2 diabetes mellitus with other specified complication: Secondary | ICD-10-CM | POA: Diagnosis not present

## 2019-10-25 DIAGNOSIS — T466X5A Adverse effect of antihyperlipidemic and antiarteriosclerotic drugs, initial encounter: Secondary | ICD-10-CM | POA: Diagnosis not present

## 2019-10-25 DIAGNOSIS — E785 Hyperlipidemia, unspecified: Secondary | ICD-10-CM | POA: Diagnosis not present

## 2019-10-31 DIAGNOSIS — Z1231 Encounter for screening mammogram for malignant neoplasm of breast: Secondary | ICD-10-CM | POA: Diagnosis not present

## 2019-11-09 ENCOUNTER — Ambulatory Visit: Payer: BC Managed Care – PPO | Admitting: Cardiology

## 2019-11-14 ENCOUNTER — Ambulatory Visit (INDEPENDENT_AMBULATORY_CARE_PROVIDER_SITE_OTHER): Payer: BC Managed Care – PPO | Admitting: *Deleted

## 2019-11-14 ENCOUNTER — Encounter: Payer: Self-pay | Admitting: Cardiology

## 2019-11-14 ENCOUNTER — Other Ambulatory Visit: Payer: Self-pay

## 2019-11-14 ENCOUNTER — Ambulatory Visit (INDEPENDENT_AMBULATORY_CARE_PROVIDER_SITE_OTHER): Payer: BC Managed Care – PPO | Admitting: Cardiology

## 2019-11-14 ENCOUNTER — Telehealth: Payer: Self-pay | Admitting: Cardiology

## 2019-11-14 VITALS — BP 118/64 | HR 87 | Ht 63.0 in | Wt 143.0 lb

## 2019-11-14 DIAGNOSIS — Z7901 Long term (current) use of anticoagulants: Secondary | ICD-10-CM | POA: Diagnosis not present

## 2019-11-14 DIAGNOSIS — I480 Paroxysmal atrial fibrillation: Secondary | ICD-10-CM | POA: Diagnosis not present

## 2019-11-14 DIAGNOSIS — E669 Obesity, unspecified: Secondary | ICD-10-CM

## 2019-11-14 DIAGNOSIS — Z954 Presence of other heart-valve replacement: Secondary | ICD-10-CM | POA: Diagnosis not present

## 2019-11-14 DIAGNOSIS — I099 Rheumatic heart disease, unspecified: Secondary | ICD-10-CM | POA: Diagnosis not present

## 2019-11-14 DIAGNOSIS — Z9889 Other specified postprocedural states: Secondary | ICD-10-CM | POA: Diagnosis not present

## 2019-11-14 DIAGNOSIS — Z8679 Personal history of other diseases of the circulatory system: Secondary | ICD-10-CM

## 2019-11-14 DIAGNOSIS — Z5181 Encounter for therapeutic drug level monitoring: Secondary | ICD-10-CM | POA: Diagnosis not present

## 2019-11-14 HISTORY — DX: Obesity, unspecified: E66.9

## 2019-11-14 LAB — POCT INR: INR: 1.9 — AB (ref 2.0–3.0)

## 2019-11-14 MED ORDER — WARFARIN SODIUM 5 MG PO TABS: ORAL_TABLET | ORAL | 0 refills | Status: DC

## 2019-11-14 NOTE — Patient Instructions (Signed)
Medication Instructions:  Your physician recommends that you continue on your current medications as directed. Please refer to the Current Medication list given to you today.  *If you need a refill on your cardiac medications before your next appointment, please call your pharmacy*  Lab Work: None.  If you have labs (blood work) drawn today and your tests are completely normal, you will receive your results only by: . MyChart Message (if you have MyChart) OR . A paper copy in the mail If you have any lab test that is abnormal or we need to change your treatment, we will call you to review the results.  Testing/Procedures: None.   Follow-Up: At [facility], you and your health needs are our priority.  As part of our continuing mission to provide you with exceptional heart care, we have created designated Provider Care Teams.  These Care Teams include your primary Cardiologist (physician) and Advanced Practice Providers (APPs -  Physician Assistants and Nurse Practitioners) who all work together to provide you with the care you need, when you need it.  Your next appointment:   4 month(s)  The format for your next appointment:   In Person  Provider:   Robert Krasowski, MD  Other Instructions   

## 2019-11-14 NOTE — Patient Instructions (Signed)
Take warfarin 4 tablets tonight then increase dose to 2 tablets daily except 3 tablets on Tuesdays, Thursdays and Saturdays.   Recheck in 2 weeks.  If INR levels out on increased dose she can continue calcium.  Call  office with any medication changes or additions [phone].

## 2019-11-14 NOTE — Progress Notes (Signed)
Cardiology Office Note:    Date:  11/14/2019   ID:  Melody Torres, DOB 12/20/1955, MRN [id number]  PCP:  Street, Stephanie Coup, MD  Cardiologist:  Gypsy Balsam, MD    Referring MD: Street, Stephanie Coup, *   Chief Complaint  Patient presents with  . Atrial Fibrillation    follow up    History of Present Illness:    Melody Torres is a 64 y.o. female past medical history significant for rheumatic heart disease, status post minimally invasive rheumatic valve replacement done about 3 months ago with mechanical valve.  She did have significant mitral stenosis as a part of surgery she also had Maze procedure and appendix amputation.  She comes today 2 months of follow-up will doing very well feeling much stronger and better she returned back to work however she said she is very exhausted at the end of the day.  Denies having any palpitations.  No chest pain tightness squeezing pressure burning chest.  When asked her today she is happy with the fact she got surgery she said very much so, she does not have any more shortness of breath she does have more energy.  Past Medical History:  Diagnosis Date  . CHF (congestive heart failure) (HCC) 2017  . Diabetes mellitus without complication (HCC)    type 2  . GERD (gastroesophageal reflux disease)   . Hyperlipidemia   . Hyperlipidemia    no meds, diet controlled  . Paroxysmal atrial fibrillation (HCC) 01/20/2016   Overview:  Chads score 2 anticoagulated with warfarin  . Rheumatic heart disease   . Rheumatic mitral regurgitation 01/20/2016   Overview:  Mild to moderate  . S/P Minimally invasive maze operation for atrial fibrillation 05/24/2019   Complete bilateral atrial lesion set using cryothermy and bipolar radiofrequency ablation with clipping of LA appendage via right mini-thoracotomy approach  . S/P minimally invasive mitral valve replacement with mechanical valve 05/24/2019   33 mm Sorin Carbomedics bileaflet mechanical valve  via right mini thoracotomy approach  . Severe mitral valve stenosis 01/20/2016  . Tricuspid regurgitation     Past Surgical History:  Procedure Laterality Date  . CLIPPING OF ATRIAL APPENDAGE N/A 05/24/2019   Procedure: Clipping Of Atrial Appendage Using AtriCure PRO2 clip size 46mm;  Surgeon: Purcell Nails, MD;  Location: [facility] OR;  Service: Open Heart Surgery;  Laterality: N/A;  . MINIMALLY INVASIVE MAZE PROCEDURE N/A 05/24/2019   Procedure: MINIMALLY INVASIVE MAZE PROCEDURE;  Surgeon: Purcell Nails, MD;  Location: [facility] OR;  Service: Open Heart Surgery;  Laterality: N/A;  . MITRAL VALVE REPLACEMENT Right 05/24/2019   Procedure: MINIMALLY INVASIVE MITRAL VALVE (MV) REPLACEMENT Using Carbomedics Optiform Heart Valve Size 64mm;  Surgeon: Purcell Nails, MD;  Location: [facility] OR;  Service: Open Heart Surgery;  Laterality: Right;  . RIGHT/LEFT HEART CATH AND CORONARY ANGIOGRAPHY N/A 04/07/2019   Procedure: RIGHT/LEFT HEART CATH AND CORONARY ANGIOGRAPHY;  Surgeon: Marykay Lex, MD;  Location: [facility] INVASIVE CV LAB;  Service: Cardiovascular;  Laterality: N/A;  . TEE WITHOUT CARDIOVERSION N/A 01/06/2019   Procedure: TRANSESOPHAGEAL ECHOCARDIOGRAM (TEE);  Surgeon: Lars Masson, MD;  Location: [facility] ENDOSCOPY;  Service: Cardiovascular;  Laterality: N/A;  . TEE WITHOUT CARDIOVERSION N/A 05/24/2019   Procedure: TRANSESOPHAGEAL ECHOCARDIOGRAM (TEE);  Surgeon: Purcell Nails, MD;  Location: [facility] OR;  Service: Open Heart Surgery;  Laterality: N/A;  . WISDOM TOOTH EXTRACTION      Current Medications: Current Meds  Medication Sig  . aspirin EC 81  MG EC tablet Take 1 tablet (81 mg total) by mouth daily.  . Chromium (CHROMEMATE PO) Take 600 mg by mouth daily.   . clobetasol cream (TEMOVATE) 0.05 % Apply 1 application topically 2 (two) times daily as needed (for lichen sclerosus).   Marland Kitchen JANUVIA 100 MG tablet Take 100 mg by mouth every evening.   . loratadine (CLARITIN) 10 MG tablet Take 10 mg by mouth daily.    . MULTAQ 400 MG tablet TAKE 1 TABLET BY MOUTH TWICE (2) DAILY  . omeprazole (PRILOSEC) 20 MG capsule Take 20 mg by mouth daily as needed (for acid reflux or heartburn).   Marland Kitchen OVER THE COUNTER MEDICATION Plant Calcium Bone Strength, 3 in 1 bone, joint and heart support  . warfarin (COUMADIN) 5 MG tablet Take 2-3 tablets daily as directed by coumadin clinic     Allergies:   Adhesive [tape] and Penicillins   Social History   Socioeconomic History  . Marital status: Widowed    Spouse name: Not on file  . Number of children: Not on file  . Years of education: Not on file  . Highest education level: Not on file  Occupational History  . Not on file  Tobacco Use  . Smoking status: Never Smoker  . Smokeless tobacco: Never Used  Substance and Sexual Activity  . Alcohol use: No  . Drug use: No  . Sexual activity: Not on file  Other Topics Concern  . Not on file  Social History Narrative  . Not on file   Social Determinants of Health   Financial Resource Strain:   . Difficulty of Paying Living Expenses: Not on file  Food Insecurity:   . Worried About Programme researcher, broadcasting/film/video in the Last Year: Not on file  . Ran Out of Food in the Last Year: Not on file  Transportation Needs:   . Lack of Transportation (Medical): Not on file  . Lack of Transportation (Non-Medical): Not on file  Physical Activity:   . Days of Exercise per Week: Not on file  . Minutes of Exercise per Session: Not on file  Stress:   . Feeling of Stress : Not on file  Social Connections:   . Frequency of Communication with Friends and Family: Not on file  . Frequency of Social Gatherings with Friends and Family: Not on file  . Attends Religious Services: Not on file  . Active Member of Clubs or Organizations: Not on file  . Attends Banker Meetings: Not on file  . Marital Status: Not on file     Family History: The patient's family history includes Bladder Cancer in her mother; Diabetes in her father;  Heart attack in her mother; Heart disease in her father; Hypothyroidism in her mother; Stroke in her father. ROS:   Please see the history of present illness.    All 14 point review of systems negative except as described per history of present illness  EKGs/Labs/Other Studies Reviewed:      Recent Labs: 05/24/2019: ALT 35 05/25/2019: Magnesium 2.4 05/29/2019: BUN 21; Creatinine, Ser 1.08; Hemoglobin 11.0; Platelets 182; Potassium 3.5; Sodium 135  Recent Lipid Panel    Component Value Date/Time   CHOL 140 04/29/2017 1656   TRIG 232 (H) 04/29/2017 1656   HDL 37 (L) 04/29/2017 1656   CHOLHDL 3.8 04/29/2017 1656   LDLCALC 57 04/29/2017 1656    Physical Exam:    VS:  BP 118/64 (BP Location: Left Arm, Patient Position: Sitting, Cuff Size:  Normal)   Pulse 87   Ht 5\' 3"  (1.6 m)   Wt 143 lb (64.9 kg)   LMP  (LMP Unknown)   SpO2 99%   BMI 25.33 kg/m     Wt Readings from Last 3 Encounters:  11/14/19 143 lb (64.9 kg)  08/28/19 138 lb (62.6 kg)  07/28/19 138 lb (62.6 kg)     GEN:  Well nourished, well developed in no acute distress HEENT: Normal NECK: No JVD; No carotid bruits LYMPHATICS: No lymphadenopathy CARDIAC: RRR, crisp mechanical valve sounds are present, there is soft systolic murmur grade 1/6 best heard at left border of the sternum, no rubs, no gallops RESPIRATORY:  Clear to auscultation without rales, wheezing or rhonchi  ABDOMEN: Soft, non-tender, non-distended MUSCULOSKELETAL:  No edema; No deformity  SKIN: Warm and dry LOWER EXTREMITIES: no swelling NEUROLOGIC:  Alert and oriented x 3 PSYCHIATRIC:  Normal affect   ASSESSMENT:    1. S/P Minimally invasive maze operation for atrial fibrillation   2. S/P minimally invasive mitral valve replacement with mechanical valve    3. Rheumatic heart disease   4. Paroxysmal atrial fibrillation (HCC)    PLAN:    In order of problems listed above:  1. Status post minimally invasive maze procedure.  Her EKG showed sinus  rhythm.  Monitor she where she did have some narrow complex tachycardia but no atrial fibrillation.  Will maintain anticoagulation will maintain Multaq. 2. Status post minimally invasive mitral valve replacement with mechanical prosthesis this is a 33 mm Sorin cardio medics OptiForm bileaflet mechanical valve   Medication Adjustments/Labs and Tests Ordered: Current medicines are reviewed at length with the patient today.  Concerns regarding medicines are outlined above.  No orders of the defined types were placed in this encounter.  Medication changes: No orders of the defined types were placed in this encounter.   Signed, Park Liter, MD, [facility] 11/14/2019 2:10 PM    [facility]

## 2019-11-28 ENCOUNTER — Other Ambulatory Visit: Payer: Self-pay

## 2019-11-28 ENCOUNTER — Ambulatory Visit (INDEPENDENT_AMBULATORY_CARE_PROVIDER_SITE_OTHER): Payer: BC Managed Care – PPO | Admitting: *Deleted

## 2019-11-28 DIAGNOSIS — Z7901 Long term (current) use of anticoagulants: Secondary | ICD-10-CM | POA: Diagnosis not present

## 2019-11-28 DIAGNOSIS — I480 Paroxysmal atrial fibrillation: Secondary | ICD-10-CM | POA: Diagnosis not present

## 2019-11-28 DIAGNOSIS — Z5181 Encounter for therapeutic drug level monitoring: Secondary | ICD-10-CM | POA: Diagnosis not present

## 2019-11-28 DIAGNOSIS — I099 Rheumatic heart disease, unspecified: Secondary | ICD-10-CM | POA: Diagnosis not present

## 2019-11-28 LAB — POCT INR: INR: 3 (ref 2.0–3.0)

## 2019-11-28 NOTE — Patient Instructions (Signed)
Continue warfarin 2 tablets daily except 3 tablets on Tuesdays, Thursdays and Saturdays.   Recheck in 3 weeks.   Call  office with any medication changes or additions [phone].

## 2019-12-19 ENCOUNTER — Ambulatory Visit (INDEPENDENT_AMBULATORY_CARE_PROVIDER_SITE_OTHER): Payer: BC Managed Care – PPO | Admitting: Pharmacist

## 2019-12-19 ENCOUNTER — Other Ambulatory Visit: Payer: Self-pay

## 2019-12-19 DIAGNOSIS — I099 Rheumatic heart disease, unspecified: Secondary | ICD-10-CM

## 2019-12-19 DIAGNOSIS — I480 Paroxysmal atrial fibrillation: Secondary | ICD-10-CM | POA: Diagnosis not present

## 2019-12-19 DIAGNOSIS — Z7901 Long term (current) use of anticoagulants: Secondary | ICD-10-CM

## 2019-12-19 DIAGNOSIS — Z5181 Encounter for therapeutic drug level monitoring: Secondary | ICD-10-CM | POA: Diagnosis not present

## 2019-12-19 LAB — POCT INR: INR: 4.4 — AB (ref 2.0–3.0)

## 2019-12-30 ENCOUNTER — Other Ambulatory Visit: Payer: Self-pay | Admitting: Cardiology

## 2020-01-02 ENCOUNTER — Other Ambulatory Visit: Payer: Self-pay

## 2020-01-02 ENCOUNTER — Ambulatory Visit (INDEPENDENT_AMBULATORY_CARE_PROVIDER_SITE_OTHER): Payer: BC Managed Care – PPO | Admitting: *Deleted

## 2020-01-02 DIAGNOSIS — Z7901 Long term (current) use of anticoagulants: Secondary | ICD-10-CM

## 2020-01-02 DIAGNOSIS — Z5181 Encounter for therapeutic drug level monitoring: Secondary | ICD-10-CM | POA: Diagnosis not present

## 2020-01-02 DIAGNOSIS — I480 Paroxysmal atrial fibrillation: Secondary | ICD-10-CM

## 2020-01-02 DIAGNOSIS — I099 Rheumatic heart disease, unspecified: Secondary | ICD-10-CM | POA: Diagnosis not present

## 2020-01-02 LAB — POCT INR: INR: 2.7 (ref 2.0–3.0)

## 2020-01-02 NOTE — Patient Instructions (Signed)
Continue warfarin 2 tablets daily except 3 tablets every Monday and Friday.   Recheck in 3 weeks.   Call  office with any medication changes or additions [phone].

## 2020-01-11 DIAGNOSIS — D6869 Other thrombophilia: Secondary | ICD-10-CM | POA: Diagnosis not present

## 2020-01-11 DIAGNOSIS — L03119 Cellulitis of unspecified part of limb: Secondary | ICD-10-CM | POA: Diagnosis not present

## 2020-01-11 DIAGNOSIS — I4891 Unspecified atrial fibrillation: Secondary | ICD-10-CM | POA: Diagnosis not present

## 2020-01-11 DIAGNOSIS — I5030 Unspecified diastolic (congestive) heart failure: Secondary | ICD-10-CM | POA: Diagnosis not present

## 2020-01-16 ENCOUNTER — Ambulatory Visit (INDEPENDENT_AMBULATORY_CARE_PROVIDER_SITE_OTHER): Payer: BC Managed Care – PPO | Admitting: *Deleted

## 2020-01-16 ENCOUNTER — Other Ambulatory Visit: Payer: Self-pay

## 2020-01-16 DIAGNOSIS — I480 Paroxysmal atrial fibrillation: Secondary | ICD-10-CM | POA: Diagnosis not present

## 2020-01-16 DIAGNOSIS — I099 Rheumatic heart disease, unspecified: Secondary | ICD-10-CM | POA: Diagnosis not present

## 2020-01-16 DIAGNOSIS — Z7901 Long term (current) use of anticoagulants: Secondary | ICD-10-CM

## 2020-01-16 DIAGNOSIS — Z5181 Encounter for therapeutic drug level monitoring: Secondary | ICD-10-CM

## 2020-01-16 LAB — POCT INR: INR: 4.1 — AB (ref 2.0–3.0)

## 2020-01-16 NOTE — Patient Instructions (Signed)
Hold warfarin tonight then resume 2 tablets daily except 3 tablets every Monday and Friday.   Recheck in 2 weeks.   Call  office with any medication changes or additions [phone].  On doxycycline for cellulitis.  Will finish Thursday.

## 2020-01-23 ENCOUNTER — Encounter: Payer: Self-pay | Admitting: Thoracic Surgery (Cardiothoracic Vascular Surgery)

## 2020-01-30 ENCOUNTER — Ambulatory Visit (INDEPENDENT_AMBULATORY_CARE_PROVIDER_SITE_OTHER): Payer: BC Managed Care – PPO | Admitting: *Deleted

## 2020-01-30 ENCOUNTER — Other Ambulatory Visit: Payer: Self-pay

## 2020-01-30 DIAGNOSIS — Z7901 Long term (current) use of anticoagulants: Secondary | ICD-10-CM | POA: Diagnosis not present

## 2020-01-30 DIAGNOSIS — Z5181 Encounter for therapeutic drug level monitoring: Secondary | ICD-10-CM

## 2020-01-30 DIAGNOSIS — I480 Paroxysmal atrial fibrillation: Secondary | ICD-10-CM

## 2020-01-30 DIAGNOSIS — I099 Rheumatic heart disease, unspecified: Secondary | ICD-10-CM

## 2020-01-30 LAB — POCT INR: INR: 2.9 (ref 2.0–3.0)

## 2020-01-30 NOTE — Patient Instructions (Signed)
Continue warfarin 2 tablets daily except 3 tablets every Monday and Friday.   Recheck in 4 weeks.   Call  office with any medication changes or additions [phone].

## 2020-02-17 ENCOUNTER — Other Ambulatory Visit: Payer: Self-pay | Admitting: Cardiology

## 2020-02-18 IMAGING — DX PORTABLE CHEST - 1 VIEW
1 series · 1 of 1 positions shown · non-contrast
Comparison: 05/22/2019

CLINICAL DATA: Severe mitral stenosis. Status post mitral valve
replacement.

EXAM:
PORTABLE CHEST 1 VIEW

[chest ap]
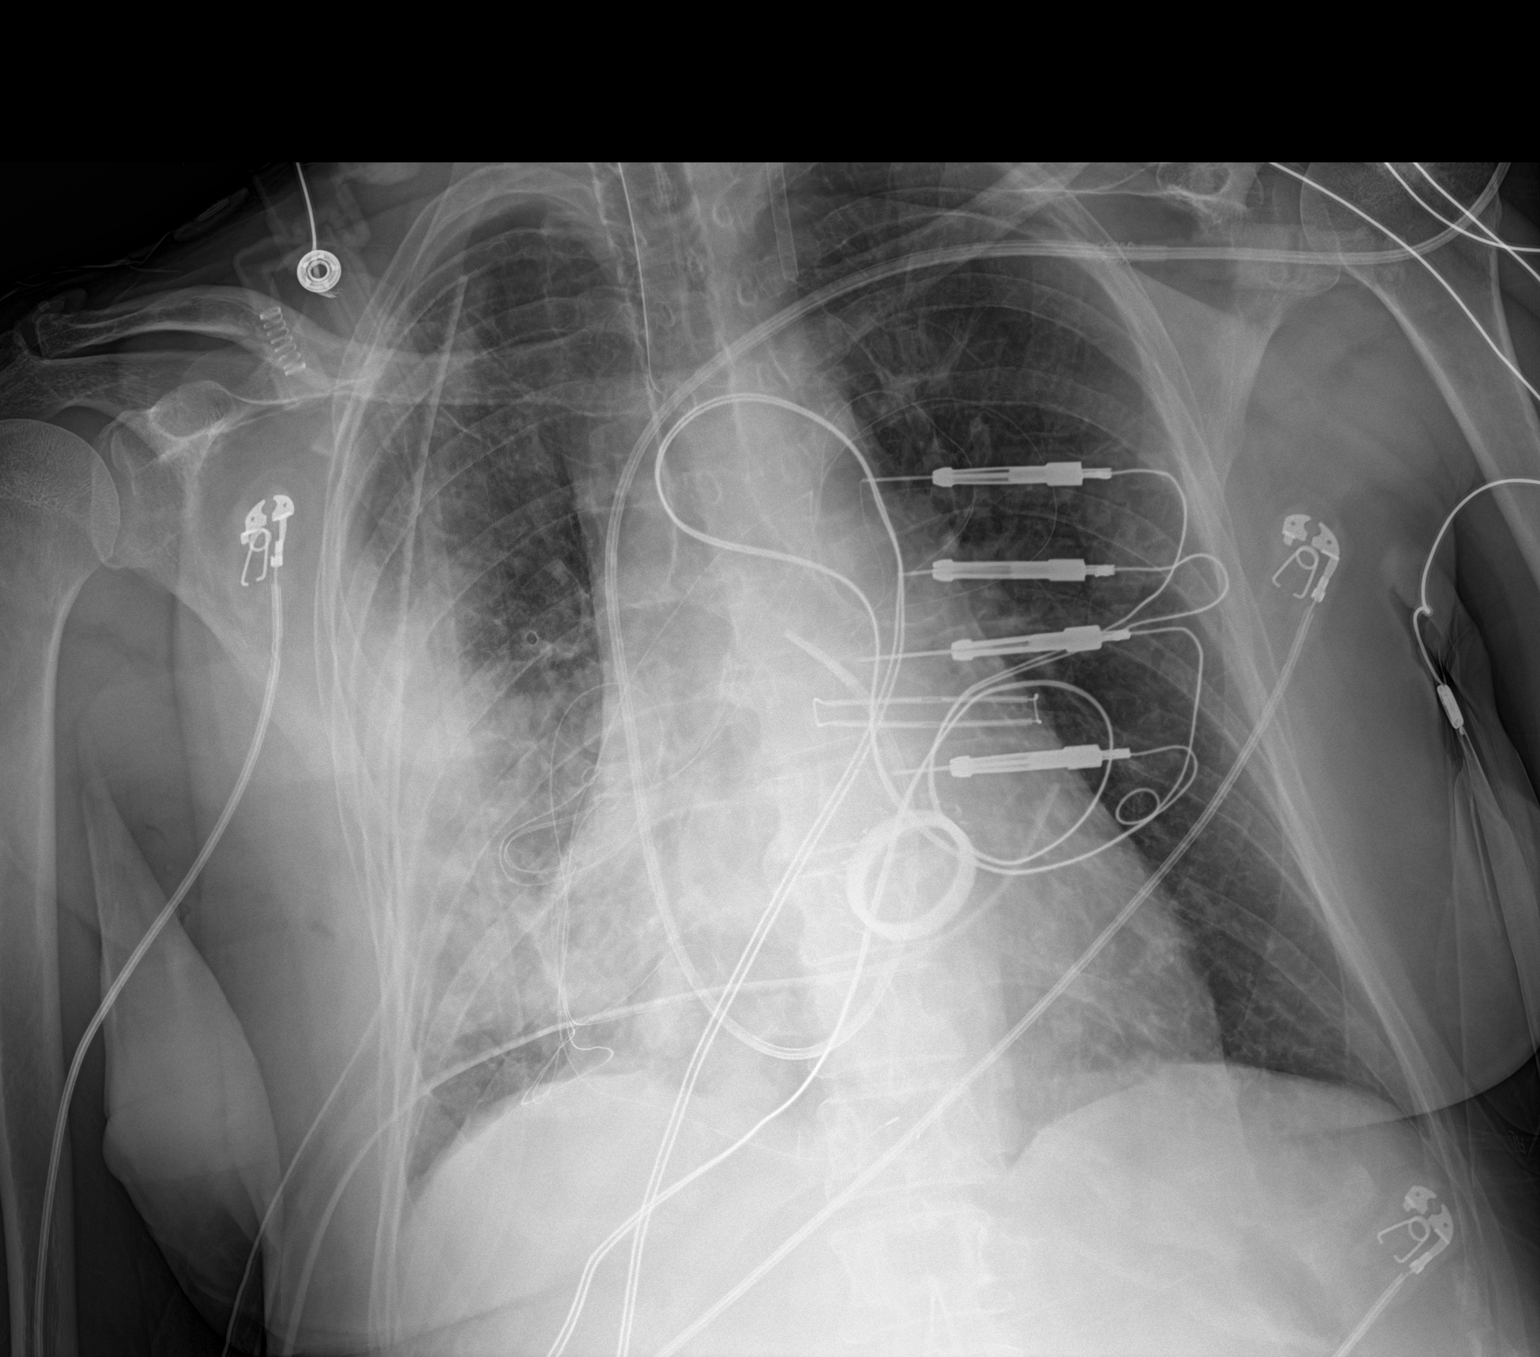

[1 of 1 positions shown; findings below may reference images not displayed]

FINDINGS: Endotracheal tube tip is 5.4 cm above the carina. Swan-Ganz catheter
tip is in the main pulmonary artery. Right chest tube appears in
good position with no pneumothorax. Sheath in the left jugular vein
with the tip near the junction with the left innominate vein. A
chest tube lies across the heart from right to left, possibly in the
pericardium.

There is peripheral haziness in the right midzone and at the right
lung base consistent pulmonary edema, probably due to reinflation.
Right lung is clear.

Mitral valve prosthesis in place. Clip on the left atrial appendage.
IMPRESSION: 1. Tubes and lines in good position.
2. No pneumothorax.
3.  pulmonary edema on the right.

## 2020-02-19 NOTE — Telephone Encounter (Signed)
LOV with Dr. Bing Matter was 11/04/2019

## 2020-02-22 IMAGING — DX PORTABLE CHEST - 1 VIEW
1 series · 1 of 1 positions shown · non-contrast
Comparison: 05/26/2019 and earlier exams.

CLINICAL DATA: Status post cardiac surgery and mitral valve
replacement. Follow-up study.

EXAM:
PORTABLE CHEST 1 VIEW

[chest]
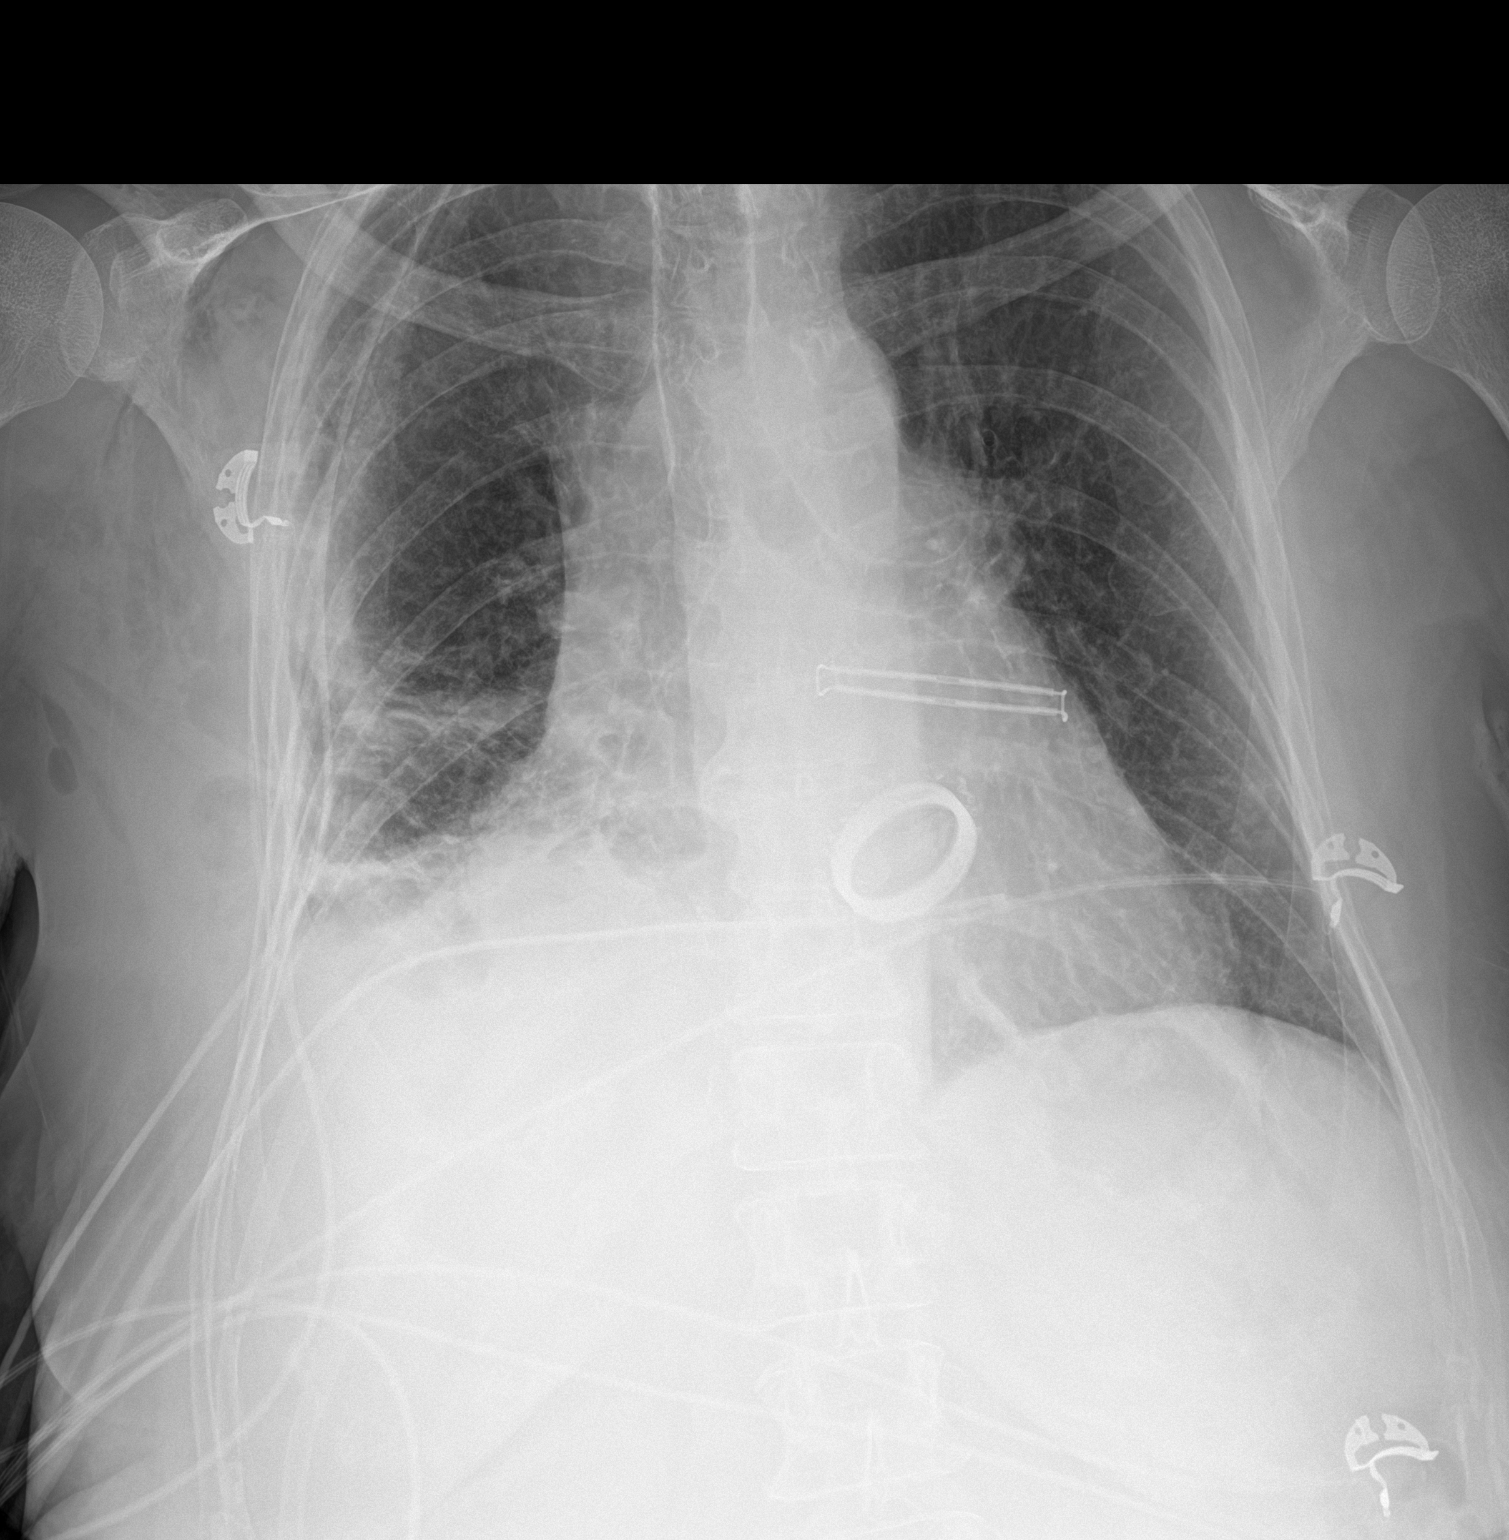

[1 of 1 positions shown; findings below may reference images not displayed]

FINDINGS: Cardiac silhouette mildly enlarged.  No mediastinal widening.

Opacity at the right lung base has slightly improved. This is
consistent with atelectasis likely with a small effusion. Mild
linear atelectasis is noted at the medial left lung base, stable.

Right chest tube is stable, tip near the right apex.

No residual pneumothorax.

Left subclavian introducer sheath has been removed.

Right anterolateral chest wall subcutaneous emphysema appears mildly
increased from the prior study.
IMPRESSION: 1. No residual pneumothorax.
2. Mild improvement in right lung base opacity consistent with
improved atelectasis. Probable small residual effusion.
3. No new lung abnormalities.
4. Stable right sided chest tube.

## 2020-02-23 IMAGING — DX PORTABLE CHEST - 1 VIEW
1 series · 1 of 1 positions shown · non-contrast
Comparison: 05/28/2019.  05/26/2019.

CLINICAL DATA: Chest tube.  Pleural effusion.

EXAM:
PORTABLE CHEST 1 VIEW

[chest ap]
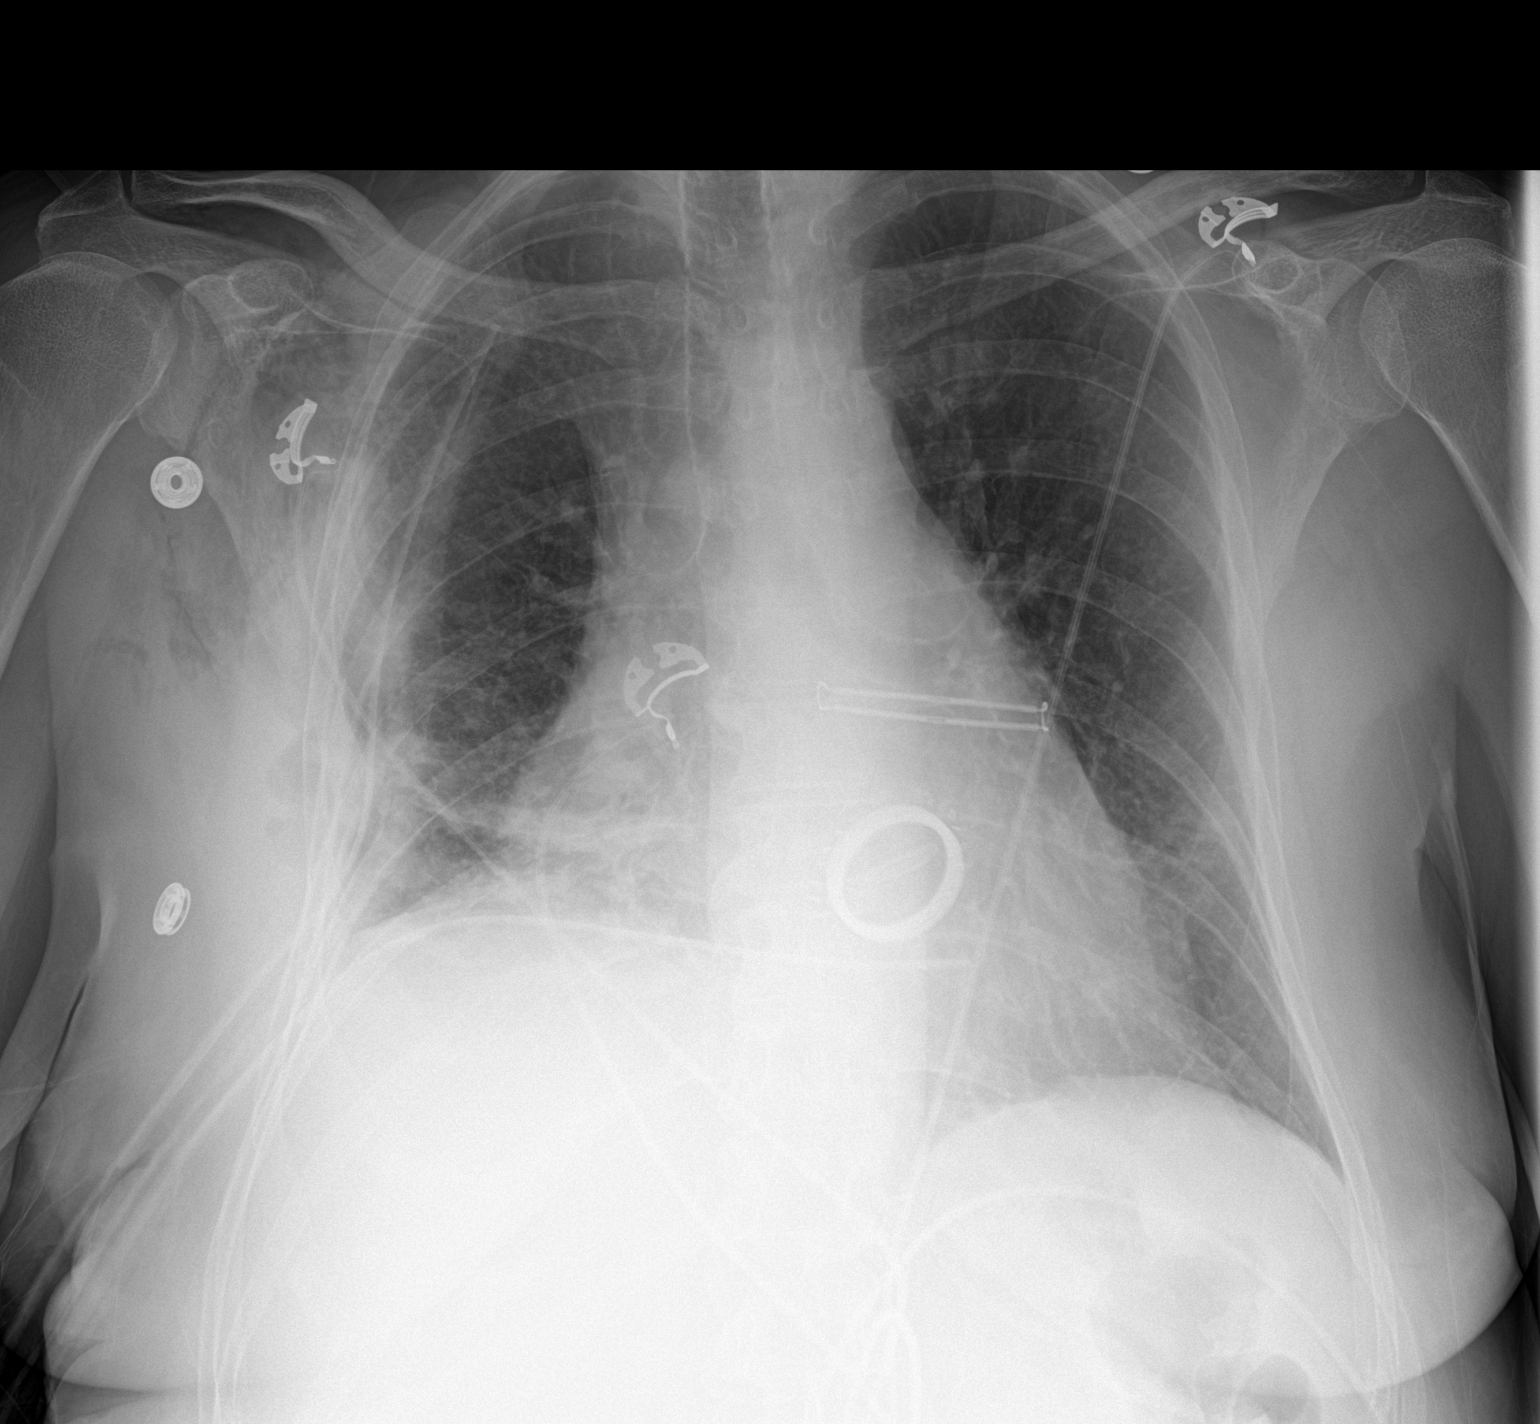

[1 of 1 positions shown; findings below may reference images not displayed]

FINDINGS: Right chest tube and midline drainage catheter stable position.
Miniscule right pneumothorax cannot be excluded. This is smaller
than noted on prior study of 05/26/2019. Prior cardiac valve
replacement. Left atrial appendage clip in stable position. Stable
cardiomegaly. No pulmonary venous congestion. Stable atelectatic
changes right lung base. Right chest wall subcutaneous emphysema
again noted.
IMPRESSION: 1. Right chest tube and midline drainage catheter in stable
position. Miniscule right apical pneumothorax cannot be excluded.
This is smaller than noted on prior study of 05/26/2019. Stable
right chest wall subcutaneous emphysema.

2.  Persistent right base atelectasis.

3. Prior cardiac valve replacement. Left atrial appendage clip in
stable position. Stable cardiomegaly. No pulmonary venous
congestion.

## 2020-02-24 IMAGING — CR CHEST - 2 VIEW
2 series · 2 of 2 positions shown · non-contrast
Comparison: 05/29/2019 and earlier.

CLINICAL DATA: 63-year-old female postoperative day 6 status post
mitral valve replacement and Maze procedure.

EXAM:
CHEST - 2 VIEW

[chest pa]
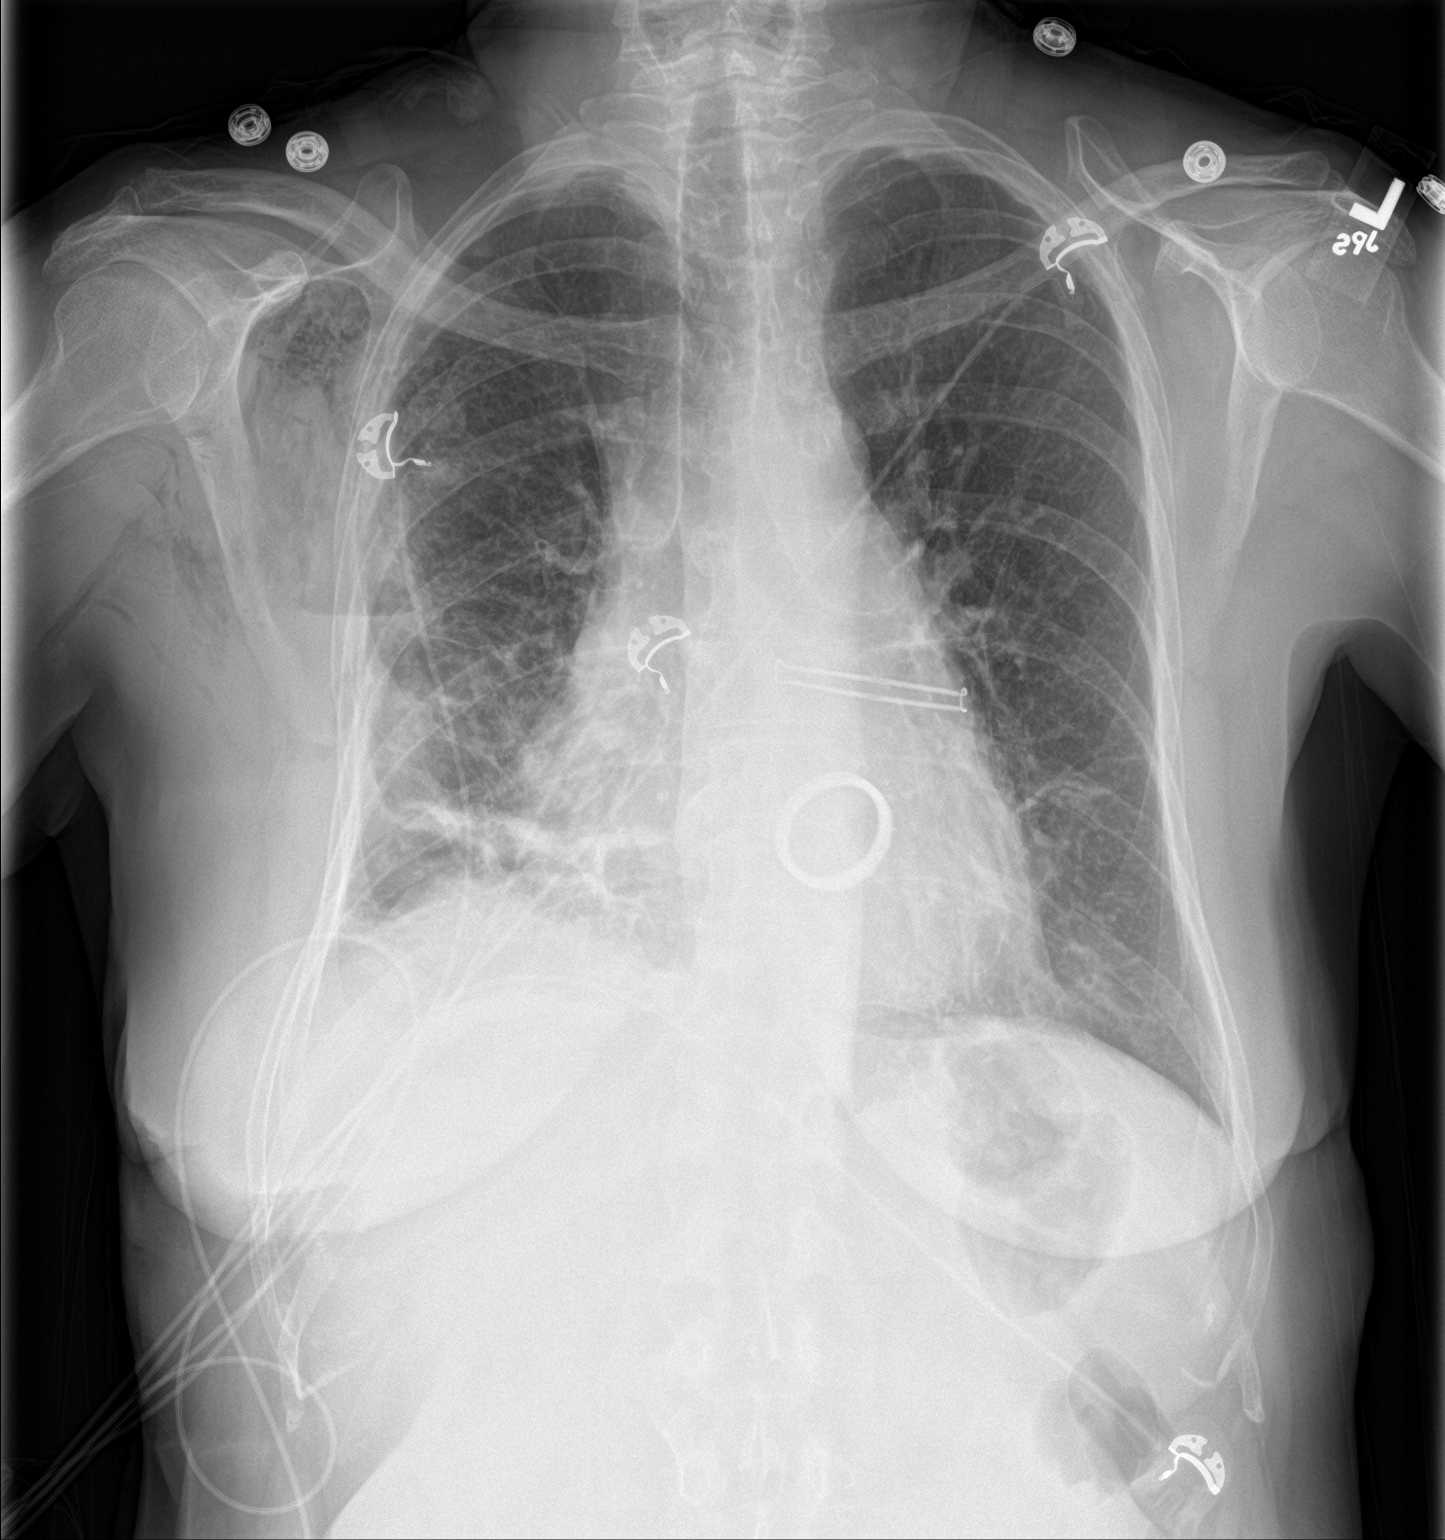

[chest lat]
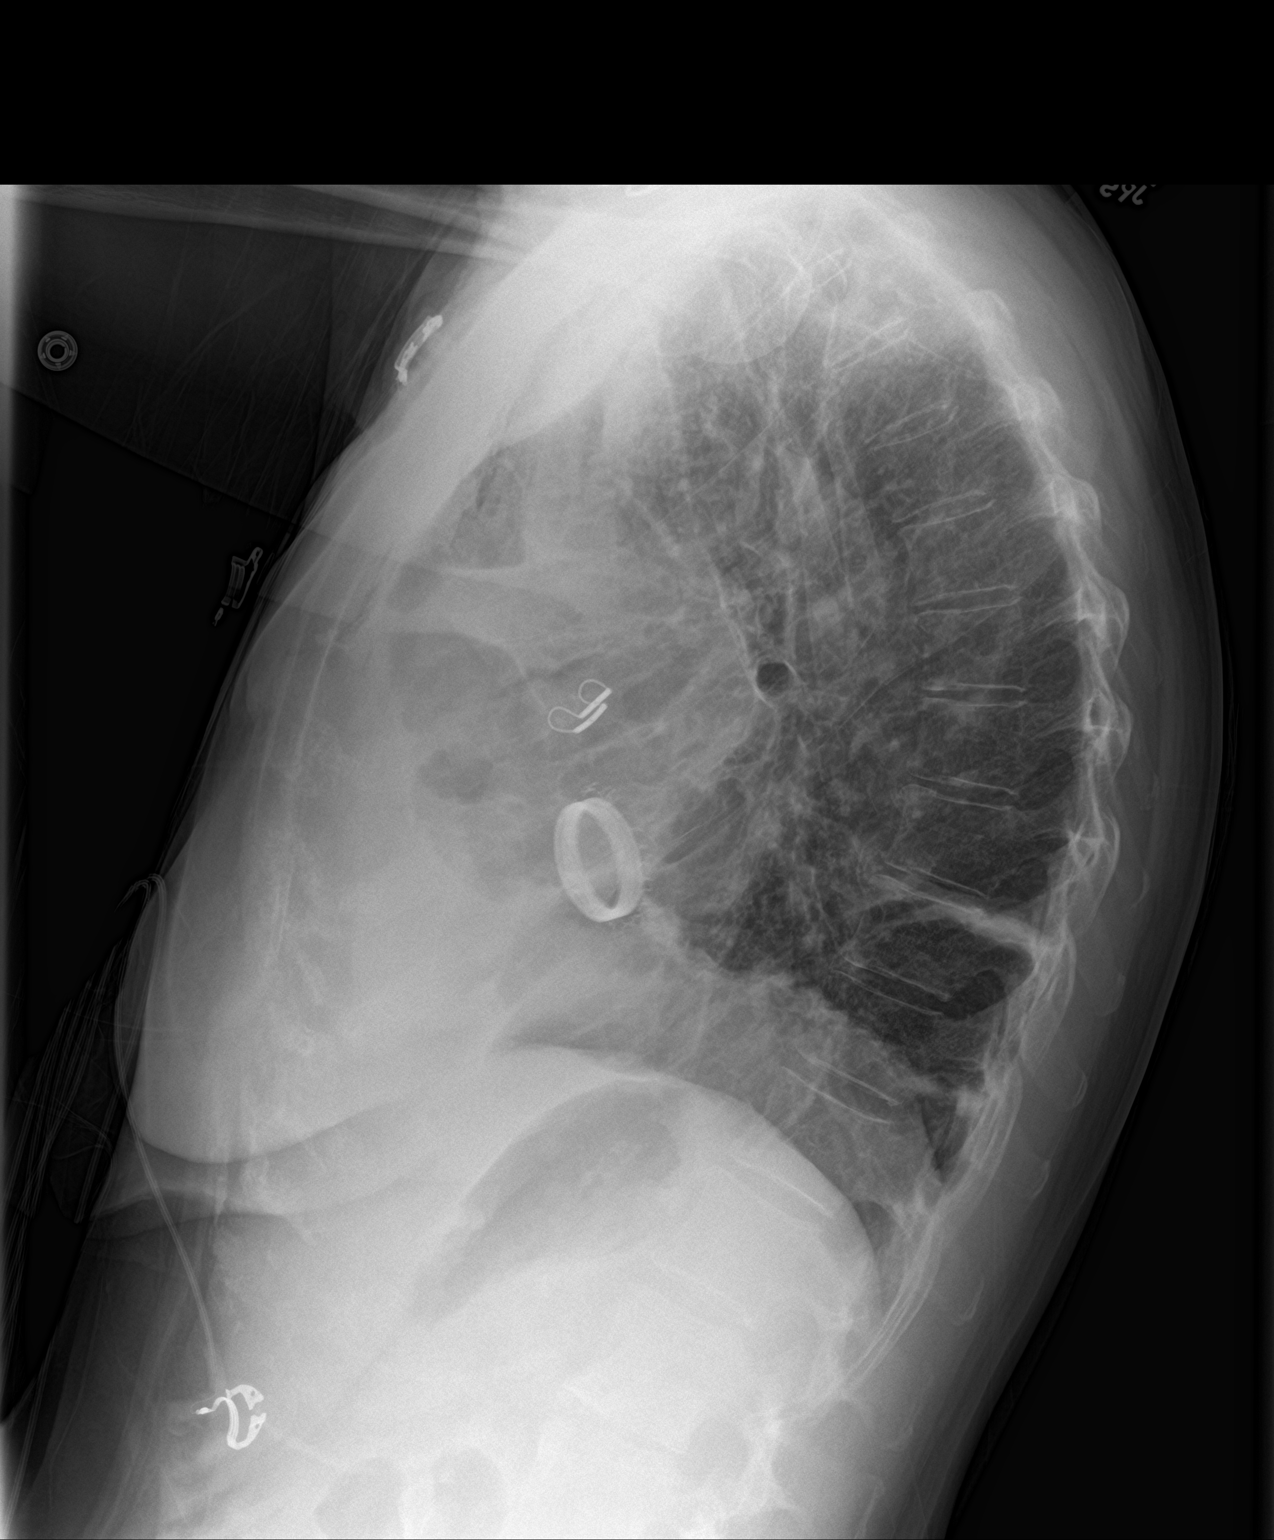

[2 of 2 positions shown; findings below may reference images not displayed]

FINDINGS: PA and lateral views. The right chest tube has been removed. No
pneumothorax is identified, although there is an extrapleural fluid
level in the right chest wall evident (arrow) in the region of
subcutaneous gas.

Superimposed small right pleural effusion with patchy atelectasis in
the right lower lung.

Stable cardiac size and mediastinal contours. The left lung is
stable with minimal atelectasis. Stable visualized osseous
structures. Negative visible bowel gas pattern.
IMPRESSION: 1. Right chest tube removed with no pneumothorax identified.
2. Small right pleural effusion with patchy atelectasis in the right
lower lung.
3. There is a small right chest wall fluid level visible (arrow), in
the area of subcutaneous emphysema.

## 2020-03-05 ENCOUNTER — Ambulatory Visit (INDEPENDENT_AMBULATORY_CARE_PROVIDER_SITE_OTHER): Payer: BC Managed Care – PPO | Admitting: Pharmacist

## 2020-03-05 ENCOUNTER — Encounter: Payer: Self-pay | Admitting: Gastroenterology

## 2020-03-05 ENCOUNTER — Other Ambulatory Visit: Payer: Self-pay

## 2020-03-05 DIAGNOSIS — I099 Rheumatic heart disease, unspecified: Secondary | ICD-10-CM | POA: Diagnosis not present

## 2020-03-05 DIAGNOSIS — Z7901 Long term (current) use of anticoagulants: Secondary | ICD-10-CM

## 2020-03-05 DIAGNOSIS — Z5181 Encounter for therapeutic drug level monitoring: Secondary | ICD-10-CM | POA: Diagnosis not present

## 2020-03-05 DIAGNOSIS — I480 Paroxysmal atrial fibrillation: Secondary | ICD-10-CM | POA: Diagnosis not present

## 2020-03-05 LAB — POCT INR: INR: 3.2 — AB (ref 2.0–3.0)

## 2020-03-05 NOTE — Patient Instructions (Signed)
Description   Continue warfarin 2 tablets daily except 3 tablets every Monday and Friday.   Recheck in 4 weeks.   Call  office with any medication changes or additions [phone].      

## 2020-03-08 ENCOUNTER — Telehealth: Payer: Self-pay | Admitting: Emergency Medicine

## 2020-03-08 DIAGNOSIS — R631 Polydipsia: Secondary | ICD-10-CM | POA: Diagnosis not present

## 2020-03-08 DIAGNOSIS — E7800 Pure hypercholesterolemia, unspecified: Secondary | ICD-10-CM | POA: Diagnosis not present

## 2020-03-08 NOTE — Telephone Encounter (Signed)
Patient in lab today needing lab orders. Orders placed per Dr. Bing Matter.

## 2020-03-09 LAB — LIPID PANEL
Chol/HDL Ratio: 4.4 ratio (ref 0.0–4.4)
Cholesterol, Total: 203 mg/dL — ABNORMAL HIGH (ref 100–199)
HDL: 46 mg/dL (ref >39)
LDL Chol Calc (NIH): 127 mg/dL — ABNORMAL HIGH (ref 0–99)
Triglycerides: 168 mg/dL — ABNORMAL HIGH (ref 0–149)
VLDL Cholesterol Cal: 30 mg/dL (ref 5–40)

## 2020-03-09 LAB — HEMOGLOBIN A1C
Est. average glucose Bld gHb Est-mCnc: 114 mg/dL
Hgb A1c MFr Bld: 5.6 % (ref 4.8–5.6)

## 2020-03-14 ENCOUNTER — Telehealth: Payer: Self-pay

## 2020-03-14 MED ORDER — ATORVASTATIN CALCIUM 20 MG PO TABS: 20.0000 mg | ORAL_TABLET | Freq: Every day | ORAL | 3 refills | Status: DC

## 2020-03-14 NOTE — Telephone Encounter (Signed)
-----   Message from Georgeanna Lea, MD sent at 03/14/2020  1:12 PM EDT ----- She is to be taking statin, if she is willing to start taking I would suggest Lipitor 20 mg daily

## 2020-03-14 NOTE — Telephone Encounter (Signed)
Patient returning call.

## 2020-03-14 NOTE — Telephone Encounter (Signed)
Left message on patients voicemail to please return our call.   

## 2020-03-14 NOTE — Telephone Encounter (Signed)
Spoke with patient regarding results and recommendation.  Patient verbalizes understanding and is agreeable to plan of care. Advised patient to call back with any issues or concerns.  

## 2020-03-14 NOTE — Addendum Note (Signed)
Addended by: Delorse Limber I on: 03/14/2020 04:53 PM   Modules accepted: Orders

## 2020-04-08 ENCOUNTER — Other Ambulatory Visit: Payer: Self-pay

## 2020-04-08 NOTE — Telephone Encounter (Signed)
error 

## 2020-04-09 ENCOUNTER — Ambulatory Visit (INDEPENDENT_AMBULATORY_CARE_PROVIDER_SITE_OTHER): Payer: BC Managed Care – PPO | Admitting: Cardiology

## 2020-04-09 ENCOUNTER — Other Ambulatory Visit: Payer: Self-pay

## 2020-04-09 ENCOUNTER — Encounter: Payer: Self-pay | Admitting: Cardiology

## 2020-04-09 ENCOUNTER — Ambulatory Visit (INDEPENDENT_AMBULATORY_CARE_PROVIDER_SITE_OTHER): Payer: BC Managed Care – PPO | Admitting: Pharmacist

## 2020-04-09 VITALS — BP 126/62 | HR 73 | Ht 63.0 in | Wt 156.2 lb

## 2020-04-09 DIAGNOSIS — Z7901 Long term (current) use of anticoagulants: Secondary | ICD-10-CM | POA: Diagnosis not present

## 2020-04-09 DIAGNOSIS — Z8679 Personal history of other diseases of the circulatory system: Secondary | ICD-10-CM | POA: Diagnosis not present

## 2020-04-09 DIAGNOSIS — I099 Rheumatic heart disease, unspecified: Secondary | ICD-10-CM | POA: Diagnosis not present

## 2020-04-09 DIAGNOSIS — Z5181 Encounter for therapeutic drug level monitoring: Secondary | ICD-10-CM

## 2020-04-09 DIAGNOSIS — Z9889 Other specified postprocedural states: Secondary | ICD-10-CM

## 2020-04-09 DIAGNOSIS — Z954 Presence of other heart-valve replacement: Secondary | ICD-10-CM | POA: Diagnosis not present

## 2020-04-09 DIAGNOSIS — I480 Paroxysmal atrial fibrillation: Secondary | ICD-10-CM

## 2020-04-09 DIAGNOSIS — E7800 Pure hypercholesterolemia, unspecified: Secondary | ICD-10-CM

## 2020-04-09 LAB — POCT INR: INR: 3.5 — AB (ref 2.0–3.0)

## 2020-04-09 NOTE — Addendum Note (Signed)
Addended by: Lita Mains on: 04/09/2020 08:36 AM   Modules accepted: Orders

## 2020-04-09 NOTE — Progress Notes (Signed)
Cardiology Office Note:    Date:  04/09/2020   ID:  Melody Torres, DOB 12-28-1955, MRN [id number]  PCP:  Street, Stephanie Coup, MD  Cardiologist:  Gypsy Balsam, MD    Referring MD: Street, Stephanie Coup, *   Chief Complaint  Patient presents with  . Follow-up  I am doing fine  History of Present Illness:    Melody Torres is a 64 y.o. female with past medical history significant for status post mitral valve replacement with mechanical valve secondary to severe mitral regurgitation secondary to rheumatic heart disease.  Procedure was done in July 2020.  Also status post appendage amputation as well as Maze procedure.  She does have diabetes, dyslipidemia, comes today to my office for follow-up overall doing very well.  Denies have any chest pain tightness squeezing pressure burning chest, no palpitations.  She tells me that she has more energy right now than before.  There is no swelling of lower extremities.  Past Medical History:  Diagnosis Date  . Anxiety and depression   . CHF (congestive heart failure) (HCC) 2017  . Chronic anticoagulation   . Diabetes mellitus without complication (HCC)    type 2  . GERD (gastroesophageal reflux disease)   . Hyperlipidemia   . Hyperlipidemia    no meds, diet controlled  . IBS (irritable bowel syndrome)   . Paroxysmal atrial fibrillation (HCC) 01/20/2016   Overview:  Chads score 2 anticoagulated with warfarin  . Rheumatic heart disease   . Rheumatic mitral regurgitation 01/20/2016   Overview:  Mild to moderate  . S/P Minimally invasive maze operation for atrial fibrillation 05/24/2019   Complete bilateral atrial lesion set using cryothermy and bipolar radiofrequency ablation with clipping of LA appendage via right mini-thoracotomy approach  . S/P minimally invasive mitral valve replacement with mechanical valve 05/24/2019   33 mm Sorin Carbomedics bileaflet mechanical valve via right mini thoracotomy approach  . Severe mitral  valve stenosis 01/20/2016  . Tricuspid regurgitation     Past Surgical History:  Procedure Laterality Date  . CLIPPING OF ATRIAL APPENDAGE N/A 05/24/2019   Procedure: Clipping Of Atrial Appendage Using AtriCure PRO2 clip size 32mm;  Surgeon: Purcell Nails, MD;  Location: [facility] OR;  Service: Open Heart Surgery;  Laterality: N/A;  . COLONOSCOPY  06/14/2008   Mild sigmoid diverticulosis. Otherwise normal colonoscopy  . ESOPHAGOGASTRODUODENOSCOPY  05/18/2011   Schatzki ring status post esophageal dilatation. Small hiatal hernia. Incidential gastic polyps (status post polypectomy x2). Duodenal ulcers.   Marland Kitchen MINIMALLY INVASIVE MAZE PROCEDURE N/A 05/24/2019   Procedure: MINIMALLY INVASIVE MAZE PROCEDURE;  Surgeon: Purcell Nails, MD;  Location: [facility] OR;  Service: Open Heart Surgery;  Laterality: N/A;  . MITRAL VALVE REPLACEMENT Right 05/24/2019   Procedure: MINIMALLY INVASIVE MITRAL VALVE (MV) REPLACEMENT Using Carbomedics Optiform Heart Valve Size 70mm;  Surgeon: Purcell Nails, MD;  Location: [facility] OR;  Service: Open Heart Surgery;  Laterality: Right;  . RIGHT/LEFT HEART CATH AND CORONARY ANGIOGRAPHY N/A 04/07/2019   Procedure: RIGHT/LEFT HEART CATH AND CORONARY ANGIOGRAPHY;  Surgeon: Marykay Lex, MD;  Location: [facility] INVASIVE CV LAB;  Service: Cardiovascular;  Laterality: N/A;  . TEE WITHOUT CARDIOVERSION N/A 01/06/2019   Procedure: TRANSESOPHAGEAL ECHOCARDIOGRAM (TEE);  Surgeon: Lars Masson, MD;  Location: [facility] ENDOSCOPY;  Service: Cardiovascular;  Laterality: N/A;  . TEE WITHOUT CARDIOVERSION N/A 05/24/2019   Procedure: TRANSESOPHAGEAL ECHOCARDIOGRAM (TEE);  Surgeon: Purcell Nails, MD;  Location: [facility] OR;  Service: Open Heart Surgery;  Laterality: N/A;  .  WISDOM TOOTH EXTRACTION      Current Medications: Current Meds  Medication Sig  . ACCU-CHEK AVIVA PLUS test strip USE TO check blood sugar TWICE (2) DAILY  . aspirin EC 81 MG EC tablet Take 1 tablet (81 mg total) by mouth daily.  Marland Kitchen  atorvastatin (LIPITOR) 20 MG tablet Take 1 tablet (20 mg total) by mouth daily.  . Calcium 280 MG TABS Take 1 tablet by mouth daily.  . Chromium (CHROMEMATE PO) Take 600 mg by mouth daily.   . clobetasol cream (TEMOVATE) 0.05 % Apply 1 application topically 2 (two) times daily as needed (for lichen sclerosus).   Marland Kitchen doxycycline (VIBRA-TABS) 100 MG tablet Take 100 mg by mouth 2 (two) times daily.  Marland Kitchen loratadine (CLARITIN) 10 MG tablet Take 10 mg by mouth daily.  . MULTAQ 400 MG tablet TAKE 1 TABLET BY MOUTH TWICE (2) DAILY  . Multiple Vitamin (MULTI-VITAMIN DAILY) TABS Take 1 tablet by mouth daily.  Marland Kitchen omeprazole (PRILOSEC) 20 MG capsule Take 20 mg by mouth daily as needed (for acid reflux or heartburn).   . warfarin (COUMADIN) 5 MG tablet TAKE 2-3 TABLETS BY MOUTH DAILY AS DIRECTED by coumadin clinic     Allergies:   Adhesive [tape] and Penicillins   Social History   Socioeconomic History  . Marital status: Widowed    Spouse name: Not on file  . Number of children: Not on file  . Years of education: Not on file  . Highest education level: Not on file  Occupational History  . Not on file  Tobacco Use  . Smoking status: Never Smoker  . Smokeless tobacco: Never Used  Vaping Use  . Vaping Use: Never used  Substance and Sexual Activity  . Alcohol use: No  . Drug use: No  . Sexual activity: Not on file  Other Topics Concern  . Not on file  Social History Narrative  . Not on file   Social Determinants of Health   Financial Resource Strain:   . Difficulty of Paying Living Expenses:   Food Insecurity:   . Worried About Programme researcher, broadcasting/film/video in the Last Year:   . Barista in the Last Year:   Transportation Needs:   . Freight forwarder (Medical):   Marland Kitchen Lack of Transportation (Non-Medical):   Physical Activity:   . Days of Exercise per Week:   . Minutes of Exercise per Session:   Stress:   . Feeling of Stress :   Social Connections:   . Frequency of Communication with  Friends and Family:   . Frequency of Social Gatherings with Friends and Family:   . Attends Religious Services:   . Active Member of Clubs or Organizations:   . Attends Banker Meetings:   Marland Kitchen Marital Status:      Family History: The patient's family history includes Bladder Cancer in her mother; Diabetes in her father; Heart attack in her mother; Heart disease in her father; Hypothyroidism in her mother; Stroke in her father. ROS:   Please see the history of present illness.    All 14 point review of systems negative except as described per history of present illness  EKGs/Labs/Other Studies Reviewed:      Recent Labs: 05/24/2019: ALT 35 05/25/2019: Magnesium 2.4 05/29/2019: BUN 21; Creatinine, Ser 1.08; Hemoglobin 11.0; Platelets 182; Potassium 3.5; Sodium 135  Recent Lipid Panel    Component Value Date/Time   CHOL 203 (H) 03/08/2020 0856   TRIG 168 (  H) 03/08/2020 0856   HDL 46 03/08/2020 0856   CHOLHDL 4.4 03/08/2020 0856   LDLCALC 127 (H) 03/08/2020 0856    Physical Exam:    VS:  BP 126/62 (BP Location: Left Arm, Patient Position: Sitting, Cuff Size: Normal)   Pulse 73   Ht 5\' 3"  (1.6 m)   Wt 156 lb 3.2 oz (70.9 kg)   LMP  (LMP Unknown)   SpO2 97%   BMI 27.67 kg/m     Wt Readings from Last 3 Encounters:  04/09/20 156 lb 3.2 oz (70.9 kg)  11/14/19 143 lb (64.9 kg)  08/28/19 138 lb (62.6 kg)     GEN:  Well nourished, well developed in no acute distress HEENT: Normal NECK: No JVD; No carotid bruits LYMPHATICS: No lymphadenopathy CARDIAC: RRR, mechanical valve sounds crisp's., no rubs, no gallops RESPIRATORY:  Clear to auscultation without rales, wheezing or rhonchi  ABDOMEN: Soft, non-tender, non-distended MUSCULOSKELETAL:  No edema; No deformity  SKIN: Warm and dry LOWER EXTREMITIES: no swelling NEUROLOGIC:  Alert and oriented x 3 PSYCHIATRIC:  Normal affect   ASSESSMENT:    1. S/P minimally invasive mitral valve replacement with mechanical  valve    2. Rheumatic heart disease   3. Paroxysmal atrial fibrillation (HCC)   4. S/P Minimally invasive maze operation for atrial fibrillation   5. Anticoagulation adequate with anticoagulant therapy   6. Hypercholesterolemia    PLAN:    In order of problems listed above:  1. Status post minimally invasive mitral valve replacement with mechanical valve done almost a year ago.  Doing well clinically.Carbomedics Optiform Heart Valve Size 32mm; 2. Rheumatic heart disease.  Stable from that point of view she knows about need to have endocarditis prophylaxis.  She is on appropriate medication which include aspirin as well as Coumadin which I will continue. 3. Paroxysmal atrial fibrillation, status post Maze procedure as well as status post appendage amputation.  She is asking me about the role of Multaq.  And I told her that in her situation since we have no palpitation we will do can be discontinued.  I did review her monitor from winter of last year which showed some episode of narrow complex tachycardia.  We will discontinue Multaq and see how she does obviously we will continue with anticoagulation. 4. Dyslipidemia: We will schedule her to have fasting lipid profile done. 5. Overall she is doing very well.  I will schedule her to have echocardiogram in July.  I see her back in my office in about 6 months.  EKG will be done today   Medication Adjustments/Labs and Tests Ordered: Current medicines are reviewed at length with the patient today.  Concerns regarding medicines are outlined above.  No orders of the defined types were placed in this encounter.  Medication changes: No orders of the defined types were placed in this encounter.   Signed, Park Liter, MD, [facility] 04/09/2020 8:21 AM    [place]

## 2020-04-09 NOTE — Addendum Note (Signed)
Addended by: Lita Mains on: 04/09/2020 08:52 AM   Modules accepted: Orders

## 2020-04-09 NOTE — Patient Instructions (Signed)
Medication Instructions:  Your physician has recommended you make the following change in your medication:   STOP: Multaq *If you need a refill on your cardiac medications before your next appointment, please call your pharmacy*   Lab Work: None.  If you have labs (blood work) drawn today and your tests are completely normal, you will receive your results only by:  MyChart Message (if you have MyChart) OR  A paper copy in the mail If you have any lab test that is abnormal or we need to change your treatment, we will call you to review the results.   Testing/Procedures: Your physician has requested that you have an echocardiogram. Echocardiography is a painless test that uses sound waves to create images of your heart. It provides your doctor with information about the size and shape of your heart and how well your hearts chambers and valves are working. This procedure takes approximately one hour. There are no restrictions for this procedure.     Follow-Up: At [facility], you and your health needs are our priority.  As part of our continuing mission to provide you with exceptional heart care, we have created designated Provider Care Teams.  These Care Teams include your primary Cardiologist (physician) and Advanced Practice Providers (APPs -  Physician Assistants and Nurse Practitioners) who all work together to provide you with the care you need, when you need it.  We recommend signing up for the patient portal called "MyChart".  Sign up information is provided on this After Visit Summary.  MyChart is used to connect with patients for Virtual Visits (Telemedicine).  Patients are able to view lab/test results, encounter notes, upcoming appointments, etc.  Non-urgent messages can be sent to your provider as well.   To learn more about what you can do with MyChart, go to [email or web address].    Your next appointment:   6 month(s)  The format for your next appointment:   In  Person  Provider:   Gypsy Balsam, MD   Other Instructions   Echocardiogram An echocardiogram is a procedure that uses painless sound waves (ultrasound) to produce an image of the heart. Images from an echocardiogram can provide important information about:  Signs of coronary artery disease (CAD).  Aneurysm detection. An aneurysm is a weak or damaged part of an artery wall that bulges out from the normal force of blood pumping through the body.  Heart size and shape. Changes in the size or shape of the heart can be associated with certain conditions, including heart failure, aneurysm, and CAD.  Heart muscle function.  Heart valve function.  Signs of a past heart attack.  Fluid buildup around the heart.  Thickening of the heart muscle.  A tumor or infectious growth around the heart valves. Tell a health care provider about:  Any allergies you have.  All medicines you are taking, including vitamins, herbs, eye drops, creams, and over-the-counter medicines.  Any blood disorders you have.  Any surgeries you have had.  Any medical conditions you have.  Whether you are pregnant or may be pregnant. What are the risks? Generally, this is a safe procedure. However, problems may occur, including:  Allergic reaction to dye (contrast) that may be used during the procedure. What happens before the procedure? No specific preparation is needed. You may eat and drink normally. What happens during the procedure?   An IV tube may be inserted into one of your veins.  You may receive contrast through this tube. A contrast is  an injection that improves the quality of the pictures from your heart.  A gel will be applied to your chest.  A wand-like tool (transducer) will be moved over your chest. The gel will help to transmit the sound waves from the transducer.  The sound waves will harmlessly bounce off of your heart to allow the heart images to be captured in real-time  motion. The images will be recorded on a computer. The procedure may vary among health care providers and hospitals. What happens after the procedure?  You may return to your normal, everyday life, including diet, activities, and medicines, unless your health care provider tells you not to do that. Summary  An echocardiogram is a procedure that uses painless sound waves (ultrasound) to produce an image of the heart.  Images from an echocardiogram can provide important information about the size and shape of your heart, heart muscle function, heart valve function, and fluid buildup around your heart.  You do not need to do anything to prepare before this procedure. You may eat and drink normally.  After the echocardiogram is completed, you may return to your normal, everyday life, unless your health care provider tells you not to do that. This information is not intended to replace advice given to you by your health care provider. Make sure you discuss any questions you have with your health care provider. Document Revised: 02/02/2019 Document Reviewed: 11/14/2016 Elsevier Patient Education  [place].

## 2020-04-09 NOTE — Patient Instructions (Signed)
Description   Continue warfarin 2 tablets daily except 3 tablets every Monday and Friday.   Recheck in 4 weeks.   Call  office with any medication changes or additions [phone].

## 2020-04-15 ENCOUNTER — Other Ambulatory Visit: Payer: Self-pay

## 2020-04-15 ENCOUNTER — Encounter: Payer: Self-pay | Admitting: Gastroenterology

## 2020-04-15 ENCOUNTER — Ambulatory Visit (INDEPENDENT_AMBULATORY_CARE_PROVIDER_SITE_OTHER): Payer: BC Managed Care – PPO | Admitting: Gastroenterology

## 2020-04-15 VITALS — BP 126/82 | HR 79 | Temp 96.6°F | Ht 61.5 in | Wt 156.4 lb

## 2020-04-15 DIAGNOSIS — Z1212 Encounter for screening for malignant neoplasm of rectum: Secondary | ICD-10-CM

## 2020-04-15 DIAGNOSIS — K5900 Constipation, unspecified: Secondary | ICD-10-CM | POA: Diagnosis not present

## 2020-04-15 DIAGNOSIS — Z1211 Encounter for screening for malignant neoplasm of colon: Secondary | ICD-10-CM

## 2020-04-15 NOTE — Patient Instructions (Addendum)
If you are age 66 or older, your body mass index should be between 23-30. Your Body mass index is 29.07 kg/m. If this is out of the aforementioned range listed, please consider follow up with your Primary Care Provider.  If you are age 40 or younger, your body mass index should be between 19-25. Your Body mass index is 29.07 kg/m. If this is out of the aformentioned range listed, please consider follow up with your Primary Care Provider.   Your provider has ordered Cologuard testing as an option for colon cancer screening. This is performed by [employer] and may be out of network with your insurance. PRIOR to completing the test, it is YOUR responsibility to contact your insurance about covered benefits for this test. Your out of pocket expense could be anywhere from $0.00 to $649.00.   When you call to check coverage with your insurer, please provide the following information:   -The ONLY provider of Cologuard is Optician, dispensing  - CPT code for Cologuard is [phone].  [employer] NPI # [id number]  -[employer] Tax ID # [id number]   We have already sent your demographic and insurance information to [employer] (phone number [phone]) and they should contact you within the next week regarding your test. If you have not heard from them within the next week, please call our office at [phone].  Increase milk intake.  Call if any problems.  Thank you for choosing me and [facility].   Lynann Bologna, MD

## 2020-04-15 NOTE — Progress Notes (Signed)
Chief Complaint:   Referring Provider:  Street, Stephanie Coup, *      ASSESSMENT AND PLAN;   #1. Colorectal cancer screening  #2.  Constipation.    #3. S/P MVR (mech valve) on coumadin, A Fib s/p MAZE, HLD, DM2, CHF  Plan:  -Cologuard test. -Increase milk intake. -Call if with any problems. -She does understand if Cologuard test is pos, she would need colonoscopy with heparin/Lovenox bridging.  We would need cardiology help regarding that.    HPI:    Melody Torres is a 64 y.o. female  S/P MVR (mech valve) on coumadin, A Fib s/p MAZE, HLD, DM2, CHF Had constipation with fosamax, took stool softners, metamucil Off fosamax, better now. Constipation is even better if she drinks milk and has high-fiber diet. She does drink plenty of water.  No upper GI symptoms including nausea, vomiting, heartburn, regurgitation, odynophagia or dysphagia.  No fever chills or night sweats.  No recent weight loss.  She has been closely followed by Dr. Kirtland Bouchard from cardiology standpoint.  Past GI procedures -Colon (PCF) 05/2008 neg except div. -EGD 04/2011: Schatzki's ring s/p dilatation 50 fr, small HH, incidental gastric polyps, DUs. Neg CLO, neg SB Bx, gastric polyps -fundic gland polyps. Past Medical History:  Diagnosis Date  . Anxiety and depression   . CHF (congestive heart failure) (HCC) 2017  . Chronic anticoagulation   . Diabetes mellitus without complication (HCC)    type 2  . GERD (gastroesophageal reflux disease)   . Hyperlipidemia   . Hyperlipidemia    no meds, diet controlled  . IBS (irritable bowel syndrome)   . Osteoporosis   . Paroxysmal atrial fibrillation (HCC) 01/20/2016   Overview:  Chads score 2 anticoagulated with warfarin  . Rheumatic heart disease   . Rheumatic mitral regurgitation 01/20/2016   Overview:  Mild to moderate  . S/P Minimally invasive maze operation for atrial fibrillation 05/24/2019   Complete bilateral atrial lesion set using cryothermy and  bipolar radiofrequency ablation with clipping of LA appendage via right mini-thoracotomy approach  . S/P minimally invasive mitral valve replacement with mechanical valve 05/24/2019   33 mm Sorin Carbomedics bileaflet mechanical valve via right mini thoracotomy approach  . Severe mitral valve stenosis 01/20/2016  . Tricuspid regurgitation     Past Surgical History:  Procedure Laterality Date  . CLIPPING OF ATRIAL APPENDAGE N/A 05/24/2019   Procedure: Clipping Of Atrial Appendage Using AtriCure PRO2 clip size 65mm;  Surgeon: Purcell Nails, MD;  Location: [facility] OR;  Service: Open Heart Surgery;  Laterality: N/A;  . COLONOSCOPY  06/14/2008   Mild sigmoid diverticulosis. Otherwise normal colonoscopy  . ESOPHAGOGASTRODUODENOSCOPY  05/18/2011   Schatzki ring status post esophageal dilatation. Small hiatal hernia. Incidential gastic polyps (status post polypectomy x2). Duodenal ulcers.   Marland Kitchen MINIMALLY INVASIVE MAZE PROCEDURE N/A 05/24/2019   Procedure: MINIMALLY INVASIVE MAZE PROCEDURE;  Surgeon: Purcell Nails, MD;  Location: [facility] OR;  Service: Open Heart Surgery;  Laterality: N/A;  . MITRAL VALVE REPLACEMENT Right 05/24/2019   Procedure: MINIMALLY INVASIVE MITRAL VALVE (MV) REPLACEMENT Using Carbomedics Optiform Heart Valve Size 40mm;  Surgeon: Purcell Nails, MD;  Location: [facility] OR;  Service: Open Heart Surgery;  Laterality: Right;  . RIGHT/LEFT HEART CATH AND CORONARY ANGIOGRAPHY N/A 04/07/2019   Procedure: RIGHT/LEFT HEART CATH AND CORONARY ANGIOGRAPHY;  Surgeon: Marykay Lex, MD;  Location: [facility] INVASIVE CV LAB;  Service: Cardiovascular;  Laterality: N/A;  . TEE WITHOUT CARDIOVERSION N/A 01/06/2019   Procedure:  TRANSESOPHAGEAL ECHOCARDIOGRAM (TEE);  Surgeon: Dorothy Spark, MD;  Location: [facility] ENDOSCOPY;  Service: Cardiovascular;  Laterality: N/A;  . TEE WITHOUT CARDIOVERSION N/A 05/24/2019   Procedure: TRANSESOPHAGEAL ECHOCARDIOGRAM (TEE);  Surgeon: Rexene Alberts, MD;  Location: [facility];  Service:  Open Heart Surgery;  Laterality: N/A;  . WISDOM TOOTH EXTRACTION      Family History  Problem Relation Age of Onset  . Bladder Cancer Mother   . Heart attack Mother   . Hypothyroidism Mother   . Heart disease Father   . Stroke Father   . Diabetes Father   . Colon cancer Neg Hx   . Esophageal cancer Neg Hx     Social History   Tobacco Use  . Smoking status: Never Smoker  . Smokeless tobacco: Never Used  Vaping Use  . Vaping Use: Never used  Substance Use Topics  . Alcohol use: No  . Drug use: No    Current Outpatient Medications  Medication Sig Dispense Refill  . ACCU-CHEK AVIVA PLUS test strip USE TO check blood sugar TWICE (2) DAILY    . aspirin EC 81 MG EC tablet Take 1 tablet (81 mg total) by mouth daily.    Marland Kitchen atorvastatin (LIPITOR) 20 MG tablet Take 1 tablet (20 mg total) by mouth daily. 90 tablet 3  . Calcium 250 MG CAPS Take 1 capsule by mouth 2 (two) times daily.    . Chromium (CHROMEMATE PO) Take 600 mg by mouth daily.     . clobetasol cream (TEMOVATE) 7.37 % Apply 1 application topically 2 (two) times daily as needed (for lichen sclerosus).     Marland Kitchen loratadine (CLARITIN) 10 MG tablet Take 10 mg by mouth daily.    . Multiple Vitamin (MULTI-VITAMIN DAILY) TABS Take 2 tablets by mouth daily.     Marland Kitchen omeprazole (PRILOSEC) 20 MG capsule Take 20 mg by mouth daily as needed (for acid reflux or heartburn).     . warfarin (COUMADIN) 5 MG tablet TAKE 2-3 TABLETS BY MOUTH DAILY AS DIRECTED by coumadin clinic 180 tablet 0   No current facility-administered medications for this visit.    Allergies  Allergen Reactions  . Adhesive [Tape] Other (See Comments)    Skin irritation  . Penicillins Rash and Other (See Comments)    Did it involve swelling of the face/tongue/throat, SOB, or low BP? No Did it involve sudden or severe rash/hives, skin peeling, or any reaction on the inside of your mouth or nose? No Did you need to seek medical attention at a hospital or doctor's office?  No When did it last happen?childhood allergy If all above answers are "NO", may proceed with cephalosporin use.     Review of Systems:  neg     Physical Exam:    BP 126/82   Pulse 79   Temp (!) 96.6 F (35.9 C)   Ht 5' 1.5" (1.562 m)   Wt 156 lb 6 oz (70.9 kg)   LMP  (LMP Unknown)   BMI 29.07 kg/m  Wt Readings from Last 3 Encounters:  04/15/20 156 lb 6 oz (70.9 kg)  04/09/20 156 lb 3.2 oz (70.9 kg)  11/14/19 143 lb (64.9 kg)   Constitutional:  Well-developed, in no acute distress. Psychiatric: Normal mood and affect. Behavior is normal. HEENT: Pupils normal.  Conjunctivae are normal. No scleral icterus. Neck supple.  Cardiovascular: Normal rate, regular rhythm. No edema.  Mechanical click Pulmonary/chest: Effort normal and breath sounds normal. No wheezing, rales or rhonchi. Abdominal:  Soft, nondistended. Nontender. Bowel sounds active throughout. There are no masses palpable. No hepatomegaly. Rectal:  defered Neurological: Alert and oriented to person place and time. Skin: Skin is warm and dry. No rashes noted.    Edman Circle, MD 04/15/2020, 3:11 PM  Cc: Street, Stephanie Coup, *

## 2020-04-17 ENCOUNTER — Telehealth: Payer: Self-pay | Admitting: Gastroenterology

## 2020-04-17 NOTE — Telephone Encounter (Signed)
Pt called to inform that her insurance told her that cologuard is covered 100% as long as it is preventive.

## 2020-05-01 DIAGNOSIS — Z1211 Encounter for screening for malignant neoplasm of colon: Secondary | ICD-10-CM | POA: Diagnosis not present

## 2020-05-01 DIAGNOSIS — Z1212 Encounter for screening for malignant neoplasm of rectum: Secondary | ICD-10-CM | POA: Diagnosis not present

## 2020-05-02 LAB — COLOGUARD: Cologuard: POSITIVE — AB

## 2020-05-03 ENCOUNTER — Telehealth: Payer: Self-pay | Admitting: Cardiology

## 2020-05-03 NOTE — Telephone Encounter (Signed)
Patient states that Dr. Bing Matter wanted her to have an echo. I do not see any orders in, please advise.

## 2020-05-06 ENCOUNTER — Telehealth: Payer: Self-pay | Admitting: *Deleted

## 2020-05-06 NOTE — Telephone Encounter (Signed)
Called pt to remind them of Coumadin check and she asked about getting an echo. States she is supposed to be getting one but there is no order in to schedule from. Please advise

## 2020-05-07 ENCOUNTER — Other Ambulatory Visit: Payer: Self-pay

## 2020-05-07 ENCOUNTER — Ambulatory Visit (INDEPENDENT_AMBULATORY_CARE_PROVIDER_SITE_OTHER): Payer: BC Managed Care – PPO | Admitting: *Deleted

## 2020-05-07 DIAGNOSIS — Z5181 Encounter for therapeutic drug level monitoring: Secondary | ICD-10-CM | POA: Diagnosis not present

## 2020-05-07 DIAGNOSIS — I480 Paroxysmal atrial fibrillation: Secondary | ICD-10-CM | POA: Diagnosis not present

## 2020-05-07 DIAGNOSIS — I099 Rheumatic heart disease, unspecified: Secondary | ICD-10-CM | POA: Diagnosis not present

## 2020-05-07 DIAGNOSIS — Z7901 Long term (current) use of anticoagulants: Secondary | ICD-10-CM | POA: Diagnosis not present

## 2020-05-07 LAB — POCT INR: INR: 4 — AB (ref 2.0–3.0)

## 2020-05-07 NOTE — Addendum Note (Signed)
Addended by: Lita Mains on: 05/07/2020 10:18 AM   Modules accepted: Orders

## 2020-05-07 NOTE — Patient Instructions (Signed)
Hold today (Tuesday) then continue warfarin 2 tablets daily except 3 tablets every Monday and Friday.   Recheck in 3 weeks.   Call  office with any medication changes or additions [phone].

## 2020-05-09 ENCOUNTER — Other Ambulatory Visit: Payer: Self-pay

## 2020-05-09 ENCOUNTER — Ambulatory Visit (INDEPENDENT_AMBULATORY_CARE_PROVIDER_SITE_OTHER): Payer: BC Managed Care – PPO

## 2020-05-09 DIAGNOSIS — I480 Paroxysmal atrial fibrillation: Secondary | ICD-10-CM

## 2020-05-09 DIAGNOSIS — Z954 Presence of other heart-valve replacement: Secondary | ICD-10-CM | POA: Diagnosis not present

## 2020-05-09 DIAGNOSIS — Z7901 Long term (current) use of anticoagulants: Secondary | ICD-10-CM | POA: Diagnosis not present

## 2020-05-09 LAB — ECHOCARDIOGRAM COMPLETE
AR max vel: 0.92 cm2
AV Area VTI: 0.79 cm2
AV Area mean vel: 0.91 cm2
AV Mean grad: 9 mmHg
AV Peak grad: 17.5 mmHg
Ao pk vel: 2.09 m/s
Area-P 1/2: 4.96 cm2
S' Lateral: 3.9 cm

## 2020-05-09 NOTE — Progress Notes (Signed)
Complete echocardiogram has been performed.  [name] RDCS, RVT 

## 2020-05-10 ENCOUNTER — Telehealth: Payer: Self-pay | Admitting: Cardiology

## 2020-05-10 LAB — CBC
Hematocrit: 40.5 % (ref 34.0–46.6)
Hemoglobin: 13.3 g/dL (ref 11.1–15.9)
MCH: 29 pg (ref 26.6–33.0)
MCHC: 32.8 g/dL (ref 31.5–35.7)
MCV: 88 fL (ref 79–97)
Platelets: 191 10*3/uL (ref 150–450)
RBC: 4.58 x10E6/uL (ref 3.77–5.28)
RDW: 12.9 % (ref 11.7–15.4)
WBC: 11.5 10*3/uL — ABNORMAL HIGH (ref 3.4–10.8)

## 2020-05-10 LAB — PROTIME-INR
INR: 2.3 — ABNORMAL HIGH (ref 0.9–1.2)
Prothrombin Time: 24 s — ABNORMAL HIGH (ref 9.1–12.0)

## 2020-05-10 NOTE — Telephone Encounter (Signed)
Patient calling stating she saw her lab results and would like a call back on what she is to do about her medications.

## 2020-05-10 NOTE — Telephone Encounter (Signed)
Called patient informed her per Dr. Bing Matter to go back on normal dose of coumadin. I also advised patient if bleeding gets worse to go to the emergency department. She verbally understood she also plans to follow up with pcp.

## 2020-05-13 ENCOUNTER — Telehealth: Payer: Self-pay

## 2020-05-13 ENCOUNTER — Other Ambulatory Visit: Payer: Self-pay

## 2020-05-13 ENCOUNTER — Other Ambulatory Visit: Payer: Self-pay | Admitting: Cardiology

## 2020-05-13 NOTE — Telephone Encounter (Signed)
-----   Message from Georgeanna Lea, MD sent at 05/13/2020  8:43 AM EDT ----- INR therapeutic call, blood count is stable.  Actually no anemia anymore.  Minimal elevation of white blood cell count

## 2020-05-13 NOTE — Telephone Encounter (Signed)
Spoke with patient regarding results and recommendation.  Patient verbalizes understanding and is agreeable to plan of care. Advised patient to call back with any issues or concerns.  

## 2020-05-14 MED ORDER — WARFARIN SODIUM 5 MG PO TABS: ORAL_TABLET | ORAL | 0 refills | Status: DC

## 2020-05-17 ENCOUNTER — Telehealth: Payer: Self-pay

## 2020-05-17 NOTE — Telephone Encounter (Signed)
Spoke with patient regarding positive cologuard.  Per Dr Chales Abrahams patient needs colonoscopy at [facility] off Coumadin for five days.  ??Luvenox bridge ( need cardio assitance)  CBC and CMP needed.  Patient was advised there is a waiting list and she will be contacted when a date becomes available.  Patient verbalized understanding.

## 2020-05-24 NOTE — Telephone Encounter (Signed)
Pt is requesting a call back from [facility] to advise her on scheduling the colonoscopy

## 2020-05-28 ENCOUNTER — Ambulatory Visit (INDEPENDENT_AMBULATORY_CARE_PROVIDER_SITE_OTHER): Payer: BC Managed Care – PPO

## 2020-05-28 ENCOUNTER — Other Ambulatory Visit: Payer: Self-pay

## 2020-05-28 DIAGNOSIS — Z7901 Long term (current) use of anticoagulants: Secondary | ICD-10-CM | POA: Diagnosis not present

## 2020-05-28 DIAGNOSIS — I480 Paroxysmal atrial fibrillation: Secondary | ICD-10-CM | POA: Diagnosis not present

## 2020-05-28 DIAGNOSIS — I099 Rheumatic heart disease, unspecified: Secondary | ICD-10-CM | POA: Diagnosis not present

## 2020-05-28 DIAGNOSIS — Z5181 Encounter for therapeutic drug level monitoring: Secondary | ICD-10-CM | POA: Diagnosis not present

## 2020-05-28 LAB — POCT INR: INR: 4.5 — AB (ref 2.0–3.0)

## 2020-05-28 NOTE — Telephone Encounter (Signed)
Can you schedule her for as soon as possible Do need to be off Coumadin for 5 days, cardiology clearance, cardiology's help with Lovenox bridging??  RG

## 2020-05-28 NOTE — Patient Instructions (Signed)
Hold today (Tuesday) then decrease warfarin 2 tablets daily except 3 tablets every Monday.   Recheck in 3 weeks.   Call  office with any medication changes or additions [phone].

## 2020-05-29 ENCOUNTER — Other Ambulatory Visit: Payer: Self-pay

## 2020-05-29 ENCOUNTER — Telehealth: Payer: Self-pay

## 2020-05-29 DIAGNOSIS — Z1211 Encounter for screening for malignant neoplasm of colon: Secondary | ICD-10-CM

## 2020-05-29 DIAGNOSIS — Z1212 Encounter for screening for malignant neoplasm of rectum: Secondary | ICD-10-CM

## 2020-05-29 NOTE — Telephone Encounter (Signed)
[facility] Pre-operative Risk Assessment     Request for surgical clearance:     Endoscopy Procedure  What type of surgery is being performed?     Colonoscopy  When is this surgery scheduled?     06/17/20 at [facility]  What type of clearance is required ?   Pharmacy  Are there any medications that need to be held prior to surgery and how long? HOLD COUMADIN FIVE DAYS PRIOR TO PROCEDURE ??? Lovenox bridge  Practice name and name of physician performing surgery?      [facility]/Dr. Lyndel Safe  What is your office phone and fax number?      Phone- [phone]  Fax- [phone]  ATTN: Peter Congo, RMA  Anesthesia type (None, local, MAC, general) ?       MAC

## 2020-05-29 NOTE — Progress Notes (Signed)
AMB 

## 2020-05-29 NOTE — Telephone Encounter (Signed)
Pharm please address coumadin with hx MVR thanks.

## 2020-05-29 NOTE — Telephone Encounter (Signed)
Spoke with patient this morning regarding colonoscopy at [facility].  Patient agrees to have procedure done on 06/17/20 at 8:30 am.  Appointment for previsit and covid screening have been arranged.  Patient is aware of dates. Letter mailed with appointment dates. Letters sent for cardiac clearance and anticoag clearance.

## 2020-05-29 NOTE — Telephone Encounter (Signed)
Patient with diagnosis of mitral mechanical valve on WARFARIN for anticoagulation.    Procedure: ENDOSCOPY/COLONOSCOPY Date of procedure: 06/17/2020  CrCl = 77ML/MIN (SCR 0.8 ON AUG/2020). Blood work repeat done today 05/27/2020  Platelet count = 191  Per office protocol, patient can hold WARFARIN for 5 days prior to procedure.  Patient WILL need bridging with Lovenox (enoxaparin) around procedure.   *Noted f/u appointment with coumadin clinic scheduled for 8/24. Will contact patient to change scheduled to earlier date and discuss bridging therapy*

## 2020-05-30 ENCOUNTER — Encounter: Payer: Self-pay | Admitting: Pharmacist Clinician (PhC)/ Clinical Pharmacy Specialist

## 2020-05-30 DIAGNOSIS — Z7901 Long term (current) use of anticoagulants: Secondary | ICD-10-CM

## 2020-05-30 DIAGNOSIS — I099 Rheumatic heart disease, unspecified: Secondary | ICD-10-CM

## 2020-05-30 DIAGNOSIS — I480 Paroxysmal atrial fibrillation: Secondary | ICD-10-CM

## 2020-05-30 DIAGNOSIS — Z5181 Encounter for therapeutic drug level monitoring: Secondary | ICD-10-CM

## 2020-05-30 MED ORDER — ENOXAPARIN SODIUM 80 MG/0.8ML ~~LOC~~ SOLN: 80.0000 mg | Freq: Two times a day (BID) | SUBCUTANEOUS | 0 refills | Status: DC

## 2020-05-30 NOTE — Telephone Encounter (Signed)
   Primary Cardiologist: Gypsy Balsam, MD  Chart reviewed as part of pre-operative protocol coverage. Given past medical history and time since last visit, based on ACC/AHA guidelines, Melody Torres would be at acceptable risk for the planned procedure without further cardiovascular testing.   Patient with diagnosis of mitral mechanical valve on WARFARIN for anticoagulation.    Procedure: ENDOSCOPY/COLONOSCOPY Date of procedure: 06/17/2020  CrCl = 77ML/MIN (SCR 0.8 ON AUG/2020). Blood work repeat done today 05/27/2020  Platelet count = 191  Per office protocol, patient can hold WARFARIN for 5 days prior to procedure.  Patient WILL need bridging with Lovenox (enoxaparin) around procedure.  I will route this recommendation to the requesting party via Epic fax function and remove from pre-op pool.  Please call with questions.  Thomasene Ripple. Lashae Wollenberg NP-C    05/30/2020, 7:59 AM [facility] [address] Office [phone] Fax [phone]

## 2020-05-30 NOTE — Telephone Encounter (Signed)
Called and lmomed the pt to call us back to get scheduled for prepreocedure and holding of coumadin. Will await a call back and will route this nl anticoag

## 2020-05-30 NOTE — Progress Notes (Signed)
error    This encounter was created in error - please disregard.

## 2020-06-03 ENCOUNTER — Encounter: Payer: Self-pay | Admitting: Thoracic Surgery (Cardiothoracic Vascular Surgery)

## 2020-06-03 ENCOUNTER — Ambulatory Visit (INDEPENDENT_AMBULATORY_CARE_PROVIDER_SITE_OTHER): Payer: BC Managed Care – PPO | Admitting: Thoracic Surgery (Cardiothoracic Vascular Surgery)

## 2020-06-03 ENCOUNTER — Other Ambulatory Visit: Payer: Self-pay

## 2020-06-03 VITALS — BP 118/77 | HR 82 | Temp 97.6°F | Resp 20 | Ht 61.5 in | Wt 156.0 lb

## 2020-06-03 DIAGNOSIS — Z8679 Personal history of other diseases of the circulatory system: Secondary | ICD-10-CM

## 2020-06-03 DIAGNOSIS — Z9889 Other specified postprocedural states: Secondary | ICD-10-CM | POA: Diagnosis not present

## 2020-06-03 DIAGNOSIS — Z954 Presence of other heart-valve replacement: Secondary | ICD-10-CM | POA: Diagnosis not present

## 2020-06-03 NOTE — Patient Instructions (Signed)

## 2020-06-03 NOTE — Progress Notes (Addendum)
[address]       [place] [id number]             [phone]     CARDIOTHORACIC SURGERY OFFICE NOTE  Primary Cardiologist is Gypsy Balsamobert Krasowski, MD PCP is Street, [name], MD   HPI:  Patient is a 64 year old female with longstanding rheumatic heart disease who returns to the office today for routine follow-up status post minimally invasive mitral valve replacement using a mechanical prosthetic valve and maze procedure on May 23, 2020 for severe mitral stenosis, mitral regurgitation, chronic diastolic congestive heart failure, and longstanding persistent atrial fibrillation.  She was last seen here in the office on on August 28, 2019 at which time she was doing well and maintaining sinus rhythm.  Since then she has been followed intermittently by Dr. Bing MatterKrasowski.  She has continued to maintain sinus rhythm off all antiarrhythmic medications including Multaq.  Routine follow-up echocardiogram performed May 09, 2020 revealed normal left ventricular function with ejection fraction estimated 50 to 55%.  There was a mechanical prosthetic valve in the mitral position that was functioning normally with no paravalvular leak.  Patient returns to our office for routine follow-up today and reports that she is doing very well from a cardiac standpoint.  She states that overall she feels "much improved" in comparison with how she felt prior to surgery and she is able to do pretty much everything that she wants to do without significant limitation..  She states that a few months ago she had a brief episode where she felt her heart racing for a brief period time but this resolved spontaneously.  Otherwise she feels as though she has been maintaining regular rhythm.  She has not had any issues with long-term anticoagulation using warfarin.  She has recently been scheduled for colonoscopy with possible biopsy following recent Cologuard test that was apparently abnormal.   Current  Outpatient Medications  Medication Sig Dispense Refill  . ACCU-CHEK AVIVA PLUS test strip USE TO check blood sugar TWICE (2) DAILY    . aspirin EC 81 MG EC tablet Take 1 tablet (81 mg total) by mouth daily.    Marland Kitchen. atorvastatin (LIPITOR) 20 MG tablet Take 1 tablet (20 mg total) by mouth daily. 90 tablet 3  . Chromium (CHROMEMATE PO) Take 600 mg by mouth daily.     . clobetasol cream (TEMOVATE) 0.05 % Apply 1 application topically 2 (two) times daily as needed (for lichen sclerosus).     . enoxaparin (LOVENOX) 80 MG/0.8ML injection Inject 0.8 mLs (80 mg total) into the skin every 12 (twelve) hours. 16 mL 0  . loratadine (CLARITIN) 10 MG tablet Take 10 mg by mouth daily.    . Multiple Vitamin (MULTI-VITAMIN DAILY) TABS Take 2 tablets by mouth daily.     Marland Kitchen. omeprazole (PRILOSEC) 20 MG capsule Take 20 mg by mouth daily as needed (for acid reflux or heartburn).     . warfarin (COUMADIN) 5 MG tablet TAKE 2-3 TABLETS BY MOUTH DAILY AS DIRECTED BY COUMADIN CLINIC 180 tablet 0  . Calcium 250 MG CAPS Take 1 capsule by mouth 2 (two) times daily.     No current facility-administered medications for this visit.      Physical Exam:   BP 118/77   Pulse 82   Temp 97.6 F (36.4 C) (Skin)   Resp 20   Ht 5' 1.5" (1.562 m)   Wt 156 lb (70.8 kg)   LMP  (LMP Unknown)  SpO2 97% Comment: RA  BMI 29.00 kg/m   General:  Well-appearing  Chest:   Clear to auscultation  CV:   Regular rate and rhythm with mechanical heart valve sounds  Incisions:  Completely healed  Abdomen:  Soft nontender  Extremities:  Warm and well-perfused  Diagnostic Tests:   2 channel telemetry rhythm strip demonstrates normal sinus rhythm    ECHOCARDIOGRAM REPORT    Patient Name:  Melody Torres Date of Exam: 05/09/2020  Medical Rec #: [id number]     Height:    61.5 in  Accession #:  [id number]     Weight:    156.4 lb  Date of Birth: [birth year]     BSA:     1.712 m  Patient Age:  64 years       BP:      126/82 mmHg  Patient Gender: F         HR:      80 bpm.  Exam Location: [place]   Procedure: 2D Echo   Indications:  Atrial Fibrillation 427.31 / I48.91    History:    Patient has prior history of Echocardiogram examinations,  most         recent 10/17/2019. S/P Minimally invasive maze operation  and         Mitral Valve Disease; Risk Factors:Dyslipidemia.           Mitral Valve: valve is present in the mitral position.    Sonographer:  Louie Boston  Referring Phys: [phone] ROBERT J KRASOWSKI   IMPRESSIONS    1. Left ventricular ejection fraction, by estimation, is 50 to 55%. The  left ventricle has low normal function. The left ventricle has no regional  wall motion abnormalities. There is mild concentric left ventricular  hypertrophy. Left ventricular  diastolic parameters are indeterminate.  2. Right ventricular systolic function is normal. The right ventricular  size is normal. There is mildly elevated pulmonary artery systolic  pressure.  3. Status post 33 carbomedic mechanical valve. No evidence of mitral  stenosis. There is a present in the mitral position.  4. The aortic valve is thickened and moderately calcified. There is  moderate aortic annular calcification. Aortic valve regurgitation is not  visualized. Mild aortic valve sclerosis is present, with no evidence of  aortic valve stenosis.  5. Mild to moderate calcification of the aortic root.  6. The inferior vena cava is normal in size with greater than 50%  respiratory variability, suggesting right atrial pressure of 3 mmHg.   FINDINGS  Left Ventricle: Left ventricular ejection fraction, by estimation, is 50  to 55%. The left ventricle has low normal function. The left ventricle has  no regional wall motion abnormalities. The left ventricular internal  cavity size was normal in size.  There is mild concentric left ventricular  hypertrophy. Left ventricular  diastolic parameters are indeterminate.   Right Ventricle: The right ventricular size is normal. No increase in  right ventricular wall thickness. Right ventricular systolic function is  normal. There is mildly elevated pulmonary artery systolic pressure. The  tricuspid regurgitant velocity is 2.89  m/s, and with an assumed right atrial pressure of 8 mmHg, the estimated  right ventricular systolic pressure is 41.4 mmHg.   Left Atrium: Left atrial size was normal in size.   Right Atrium: Right atrial size was normal in size.   Pericardium: There is no evidence of pericardial effusion. Presence of  pericardial fat pad.   Mitral Valve: Status post 33  carbomedic mechanical valve. The mitral valve  has been repaired/replaced. Normal mobility of the mitral valve leaflets.  Moderate mitral annular calcification. No evidence of mitral valve  regurgitation. There is a present in  the mitral position. No evidence of mitral valve stenosis. MV peak  gradient, 11.9 mmHg. The mean mitral valve gradient is 5.5 mmHg.   Tricuspid Valve: The tricuspid valve is normal in structure. Tricuspid  valve regurgitation is not demonstrated. No evidence of tricuspid  stenosis.   Aortic Valve: The aortic valve is abnormal. . There is mild thickening and  mild calcification of the aortic valve. Aortic valve regurgitation is not  visualized. Mild aortic valve sclerosis is present, with no evidence of  aortic valve stenosis. Moderate  aortic valve annular calcification. There is mild thickening of the aortic  valve. There is mild calcification of the aortic valve. Aortic valve mean  gradient measures 9.0 mmHg. Aortic valve peak gradient measures 17.5 mmHg.  Aortic valve area, by VTI  measures 0.79 cm.   Pulmonic Valve: The pulmonic valve was normal in structure. Pulmonic valve  regurgitation is not visualized. No evidence of pulmonic stenosis.   Aorta: Mild to moderate  calcification of the aortic root. The aortic root  is normal in size and structure.   Venous: The inferior vena cava is normal in size with greater than 50%  respiratory variability, suggesting right atrial pressure of 3 mmHg.   IAS/Shunts: No atrial level shunt detected by color flow Doppler.     LEFT VENTRICLE  PLAX 2D  LVIDd:     5.90 cm  LVIDs:     3.90 cm  LV PW:     1.00 cm  LV IVS:    1.10 cm  LVOT diam:   1.60 cm  LV SV:     35  LV SV Index:  21  LVOT Area:   2.01 cm     RIGHT VENTRICLE      IVC  RV S prime:   9.79 cm/s IVC diam: 1.30 cm  TAPSE (M-mode): 1.4 cm   LEFT ATRIUM       Index    RIGHT ATRIUM      Index  LA diam:    4.30 cm 2.51 cm/m RA Area:   12.90 cm  LA Vol (A2C):  71.4 ml 41.72 ml/m RA Volume:  30.10 ml 17.59 ml/m  LA Vol (A4C):  76.8 ml 44.87 ml/m  LA Biplane Vol: 75.9 ml 44.35 ml/m  AORTIC VALVE  AV Area (Vmax):  0.92 cm  AV Area (Vmean):  0.91 cm  AV Area (VTI):   0.79 cm  AV Vmax:      209.00 cm/s  AV Vmean:     142.000 cm/s  AV VTI:      0.448 m  AV Peak Grad:   17.5 mmHg  AV Mean Grad:   9.0 mmHg  LVOT Vmax:     96.00 cm/s  LVOT Vmean:    64.500 cm/s  LVOT VTI:     0.175 m  LVOT/AV VTI ratio: 0.39    AORTA  Ao Root diam: 3.30 cm  Ao Asc diam: 3.40 cm   MITRAL VALVE        TRICUSPID VALVE  MV Area (PHT): 4.96 cm   TR Peak grad:  33.4 mmHg  MV Peak grad: 11.9 mmHg  TR Vmax:    289.00 cm/s  MV Mean grad: 5.5 mmHg  MV Vmax:    1.72 m/s   SHUNTS  MV Vmean:   102.2 cm/s  Systemic VTI: 0.18 m  MV Decel Time: 153 msec   Systemic Diam: 1.60 cm  MV E velocity: 141.00 cm/s   Kardie Tobb DO  Electronically signed by Thomasene Ripple DO  Signature Date/Time: 05/09/2020/2:00:18 PM      Impression:  Patient is doing very well and maintaining sinus rhythm approximately 1 year status post minimally  invasive mitral valve replacement using mechanical prosthetic valve and maze procedure.    Plan:  We have not recommended any change the patient's current medications.  I have reassured the patient that there are no contraindications to her proceeding with elective colonoscopy as long as appropriate precautions and arrangements are made for interruption of anticoagulation therapy and antibiotic prophylaxis.  The patient has been reminded regarding the importance of dental hygiene and the lifelong need for antibiotic prophylaxis for all dental cleanings and other related invasive procedures. All of her questions have been addressed.  The patient will continue to follow-up regularly with Dr. Bing Matter and return to our office in the future only should specific problems or questions arise.  I spent in excess of 15 minutes during the conduct of this office consultation and >50% of this time involved direct face-to-face encounter with the patient for counseling and/or coordination of their care.   Salvatore Decent. Cornelius Moras, MD 06/03/2020 4:13 PM

## 2020-06-06 ENCOUNTER — Ambulatory Visit (AMBULATORY_SURGERY_CENTER): Payer: Self-pay | Admitting: *Deleted

## 2020-06-06 ENCOUNTER — Other Ambulatory Visit: Payer: Self-pay

## 2020-06-06 ENCOUNTER — Other Ambulatory Visit (INDEPENDENT_AMBULATORY_CARE_PROVIDER_SITE_OTHER): Payer: BC Managed Care – PPO

## 2020-06-06 VITALS — Ht 61.5 in | Wt 153.0 lb

## 2020-06-06 DIAGNOSIS — Z1211 Encounter for screening for malignant neoplasm of colon: Secondary | ICD-10-CM

## 2020-06-06 DIAGNOSIS — Z01818 Encounter for other preprocedural examination: Secondary | ICD-10-CM

## 2020-06-06 DIAGNOSIS — Z1212 Encounter for screening for malignant neoplasm of rectum: Secondary | ICD-10-CM | POA: Diagnosis not present

## 2020-06-06 LAB — COMPREHENSIVE METABOLIC PANEL
ALT: 32 U/L (ref 0–35)
AST: 35 U/L (ref 0–37)
Albumin: 4.2 g/dL (ref 3.5–5.2)
Alkaline Phosphatase: 51 U/L (ref 39–117)
BUN: 19 mg/dL (ref 6–23)
CO2: 33 mEq/L — ABNORMAL HIGH (ref 19–32)
Calcium: 10 mg/dL (ref 8.4–10.5)
Chloride: 103 mEq/L (ref 96–112)
Creatinine, Ser: 0.83 mg/dL (ref 0.40–1.20)
GFR: 68.75 mL/min (ref >60.00)
Glucose, Bld: 99 mg/dL (ref 70–99)
Potassium: 4.1 mEq/L (ref 3.5–5.1)
Sodium: 142 mEq/L (ref 135–145)
Total Bilirubin: 0.5 mg/dL (ref 0.2–1.2)
Total Protein: 7.5 g/dL (ref 6.0–8.3)

## 2020-06-06 LAB — CBC WITH DIFFERENTIAL/PLATELET
Basophils Absolute: 0.1 10*3/uL (ref 0.0–0.1)
Basophils Relative: 1.1 % (ref 0.0–3.0)
Eosinophils Absolute: 0.4 10*3/uL (ref 0.0–0.7)
Eosinophils Relative: 4.7 % (ref 0.0–5.0)
HCT: 42.2 % (ref 36.0–46.0)
Hemoglobin: 14.2 g/dL (ref 12.0–15.0)
Lymphocytes Relative: 27.5 % (ref 12.0–46.0)
Lymphs Abs: 2.1 10*3/uL (ref 0.7–4.0)
MCHC: 33.5 g/dL (ref 30.0–36.0)
MCV: 87.9 fl (ref 78.0–100.0)
Monocytes Absolute: 0.6 10*3/uL (ref 0.1–1.0)
Monocytes Relative: 7.9 % (ref 3.0–12.0)
Neutro Abs: 4.6 10*3/uL (ref 1.4–7.7)
Neutrophils Relative %: 58.8 % (ref 43.0–77.0)
Platelets: 183 10*3/uL (ref 150.0–400.0)
RBC: 4.8 Mil/uL (ref 3.87–5.11)
RDW: 13.6 % (ref 11.5–15.5)
WBC: 7.8 10*3/uL (ref 4.0–10.5)

## 2020-06-06 MED ORDER — SUTAB 1479-225-188 MG PO TABS: 1.0000 | ORAL_TABLET | Freq: Once | ORAL | 0 refills | Status: AC

## 2020-06-06 NOTE — Progress Notes (Signed)

## 2020-06-07 ENCOUNTER — Telehealth: Payer: Self-pay | Admitting: Gastroenterology

## 2020-06-07 MED ORDER — CLENPIQ 10-3.5-12 MG-GM -GM/160ML PO SOLN
1.0000 | Freq: Once | ORAL | 0 refills | Status: AC
Start: 2020-06-07 — End: 2020-06-07

## 2020-06-07 NOTE — Telephone Encounter (Signed)
Spoke with patient this morning regarding holding warfarin five days prior to procedure.  She states that she saw Dr. Cornelius Moras and he states Dr Bing Matter would have to give cardiac clearance for procedure.  I told her that Dr Ledora Bottcher has okayed her  to have procedure holding warfarin five days with Lovenox bridge.  Patients states she has been contacted regarding bridging.  Patient verbalized understanding.

## 2020-06-07 NOTE — Telephone Encounter (Signed)
Patient called states she is not able to swallow the pills for the prep wants to know if she can be given an alternative medication.

## 2020-06-07 NOTE — Telephone Encounter (Signed)
Changed prep to Clenpiq and new instructions sent via MyChart per patient request. Also instructed patient how  to access on-line coupon for Clenpiq if needed.

## 2020-06-07 NOTE — Addendum Note (Signed)
Addended by: Caren Griffins F on: 06/07/2020 11:44 AM   Modules accepted: Orders

## 2020-06-12 MED ORDER — AZITHROMYCIN 500 MG PO TABS
500.0000 mg | ORAL_TABLET | Freq: Once | ORAL | 0 refills | Status: AC
Start: 2020-06-12 — End: 2020-06-12

## 2020-06-12 NOTE — Telephone Encounter (Signed)
Her INR was a little elevated, but with a 5 day hold, even a 4.5 should be cleared from her system.

## 2020-06-12 NOTE — Telephone Encounter (Signed)
Melody Torres is calling in regards to this medication wanting to know what Dr. Bing Matter decided as well as if she needs her INR checked prior. Please advise.

## 2020-06-13 ENCOUNTER — Other Ambulatory Visit (HOSPITAL_COMMUNITY)
Admission: RE | Admit: 2020-06-13 | Discharge: 2020-06-13 | Disposition: A | Discharge status: Home / Self Care | Payer: BC Managed Care – PPO | Source: Ambulatory Visit | Attending: Gastroenterology | Admitting: Gastroenterology

## 2020-06-13 DIAGNOSIS — Z20822 Contact with and (suspected) exposure to covid-19: Secondary | ICD-10-CM | POA: Insufficient documentation

## 2020-06-13 DIAGNOSIS — Z01812 Encounter for preprocedural laboratory examination: Secondary | ICD-10-CM | POA: Diagnosis not present

## 2020-06-13 LAB — SARS CORONAVIRUS 2 (TAT 6-24 HRS): SARS Coronavirus 2: NEGATIVE

## 2020-06-17 ENCOUNTER — Ambulatory Visit (HOSPITAL_COMMUNITY): Payer: BC Managed Care – PPO | Admitting: Certified Registered Nurse Anesthetist

## 2020-06-17 ENCOUNTER — Encounter (HOSPITAL_COMMUNITY): Payer: Self-pay | Admitting: Gastroenterology

## 2020-06-17 ENCOUNTER — Other Ambulatory Visit: Payer: Self-pay

## 2020-06-17 ENCOUNTER — Ambulatory Visit (HOSPITAL_COMMUNITY)
Admission: RE | Admit: 2020-06-17 | Discharge: 2020-06-17 | Disposition: A | Discharge status: Home / Self Care | Payer: BC Managed Care – PPO | Attending: Gastroenterology | Admitting: Gastroenterology

## 2020-06-17 ENCOUNTER — Encounter (HOSPITAL_COMMUNITY)
Admission: RE | Disposition: A | Discharge status: Home / Self Care | Payer: Self-pay | Source: Home / Self Care | Attending: Gastroenterology

## 2020-06-17 DIAGNOSIS — Z952 Presence of prosthetic heart valve: Secondary | ICD-10-CM | POA: Insufficient documentation

## 2020-06-17 DIAGNOSIS — K219 Gastro-esophageal reflux disease without esophagitis: Secondary | ICD-10-CM | POA: Diagnosis not present

## 2020-06-17 DIAGNOSIS — Z1212 Encounter for screening for malignant neoplasm of rectum: Secondary | ICD-10-CM

## 2020-06-17 DIAGNOSIS — K5730 Diverticulosis of large intestine without perforation or abscess without bleeding: Secondary | ICD-10-CM | POA: Insufficient documentation

## 2020-06-17 DIAGNOSIS — E119 Type 2 diabetes mellitus without complications: Secondary | ICD-10-CM | POA: Insufficient documentation

## 2020-06-17 DIAGNOSIS — R195 Other fecal abnormalities: Secondary | ICD-10-CM | POA: Insufficient documentation

## 2020-06-17 DIAGNOSIS — K5900 Constipation, unspecified: Secondary | ICD-10-CM | POA: Insufficient documentation

## 2020-06-17 DIAGNOSIS — K648 Other hemorrhoids: Secondary | ICD-10-CM | POA: Insufficient documentation

## 2020-06-17 DIAGNOSIS — I509 Heart failure, unspecified: Secondary | ICD-10-CM | POA: Insufficient documentation

## 2020-06-17 DIAGNOSIS — I081 Rheumatic disorders of both mitral and tricuspid valves: Secondary | ICD-10-CM | POA: Diagnosis not present

## 2020-06-17 DIAGNOSIS — Z1211 Encounter for screening for malignant neoplasm of colon: Secondary | ICD-10-CM

## 2020-06-17 DIAGNOSIS — Z7901 Long term (current) use of anticoagulants: Secondary | ICD-10-CM | POA: Insufficient documentation

## 2020-06-17 DIAGNOSIS — Z79899 Other long term (current) drug therapy: Secondary | ICD-10-CM | POA: Diagnosis not present

## 2020-06-17 DIAGNOSIS — E785 Hyperlipidemia, unspecified: Secondary | ICD-10-CM | POA: Insufficient documentation

## 2020-06-17 DIAGNOSIS — I480 Paroxysmal atrial fibrillation: Secondary | ICD-10-CM | POA: Insufficient documentation

## 2020-06-17 DIAGNOSIS — Z7982 Long term (current) use of aspirin: Secondary | ICD-10-CM | POA: Diagnosis not present

## 2020-06-17 DIAGNOSIS — E7800 Pure hypercholesterolemia, unspecified: Secondary | ICD-10-CM | POA: Diagnosis not present

## 2020-06-17 HISTORY — PX: COLONOSCOPY WITH PROPOFOL: SHX5780

## 2020-06-17 LAB — GLUCOSE, CAPILLARY: Glucose-Capillary: 106 mg/dL — ABNORMAL HIGH (ref 70–99)

## 2020-06-17 LAB — PROTIME-INR
INR: 1.1 (ref 0.8–1.2)
Prothrombin Time: 13.3 seconds (ref 11.4–15.2)

## 2020-06-17 SURGERY — COLONOSCOPY WITH PROPOFOL
Anesthesia: Monitor Anesthesia Care

## 2020-06-17 MED ORDER — PROPOFOL 10 MG/ML IV BOLUS
INTRAVENOUS | Status: AC
  Filled 2020-06-17: qty 20

## 2020-06-17 MED ORDER — PROPOFOL 500 MG/50ML IV EMUL
INTRAVENOUS | Status: AC
  Filled 2020-06-17: qty 50

## 2020-06-17 MED ORDER — PROPOFOL 10 MG/ML IV BOLUS
INTRAVENOUS | Status: DC | PRN
  Administered 2020-06-17: 20 mg via INTRAVENOUS

## 2020-06-17 MED ORDER — ONDANSETRON HCL 4 MG/2ML IJ SOLN
INTRAMUSCULAR | Status: DC | PRN
  Administered 2020-06-17: 4 mg via INTRAVENOUS

## 2020-06-17 MED ORDER — LIDOCAINE 2% (20 MG/ML) 5 ML SYRINGE
INTRAMUSCULAR | Status: DC | PRN
  Administered 2020-06-17: 70 mg via INTRAVENOUS

## 2020-06-17 MED ORDER — LACTATED RINGERS IV SOLN: INTRAVENOUS | Status: DC

## 2020-06-17 MED ORDER — SODIUM CHLORIDE 0.9 % IV SOLN: INTRAVENOUS | Status: DC

## 2020-06-17 MED ORDER — PROPOFOL 500 MG/50ML IV EMUL
INTRAVENOUS | Status: DC | PRN
  Administered 2020-06-17: 100 ug/kg/min via INTRAVENOUS

## 2020-06-17 SURGICAL SUPPLY — 21 items

## 2020-06-17 NOTE — Anesthesia Postprocedure Evaluation (Signed)
Anesthesia Post Note  Patient: Melody Torres  Procedure(s) Performed: COLONOSCOPY WITH PROPOFOL (N/A )     Patient location during evaluation: Endoscopy Anesthesia Type: MAC Level of consciousness: awake Pain management: pain level controlled Vital Signs Assessment: post-procedure vital signs reviewed and stable Respiratory status: spontaneous breathing Cardiovascular status: stable Postop Assessment: no apparent nausea or vomiting Anesthetic complications: no   No complications documented.  Last Vitals:  Vitals:   06/17/20 1016 06/17/20 1026  BP: (!) 108/57 104/65  Pulse: 73 71  Resp: 13 13  Temp: 36.7 C   SpO2: 100% 97%    Last Pain:  Vitals:   06/17/20 1026  TempSrc:   PainSc: 0-No pain   Pain Goal:                   Huston Foley

## 2020-06-17 NOTE — Op Note (Signed)
[facility] Patient Name: Melody Torres Procedure Date: 06/17/2020 MRN: [id number] Attending MD: Lynann Bolognaajesh Radley Barto , MD Date of Birth: [birth year] CSN: [id number] Age: 64 Admit Type: Outpatient Procedure:                Colonoscopy Indications:              Positive Cologuard test Providers:                Lynann Bolognaajesh Clarke Amburn, MD, Dwain SarnaPatricia Ford, RN, Beryle BeamsJanie Billups,                            Technician, Waymond CeraJosh Jarvela, CRNA Referring MD:              Medicines:                Monitored Anesthesia Care Complications:            No immediate complications. Estimated Blood Loss:     Estimated blood loss: none. Procedure:                Pre-Anesthesia Assessment:                           - Prior to the procedure, a History and Physical                            was performed, and patient medications and                            allergies were reviewed. The patient's tolerance of                            previous anesthesia was also reviewed. The risks                            and benefits of the procedure and the sedation                            options and risks were discussed with the patient.                            All questions were answered, and informed consent                            was obtained. Prior Anticoagulants: The patient has                            taken Coumadin (warfarin), last dose was 5 days                            prior to procedure. ASA Grade Assessment: III - A                            patient with severe systemic disease. After  reviewing the risks and benefits, the patient was                            deemed in satisfactory condition to undergo the                            procedure.                           After obtaining informed consent, the colonoscope                            was passed under direct vision. Throughout the                            procedure, the patient's blood pressure, pulse,  and                            oxygen saturations were monitored continuously. The                            PCF-H190DL ([id number]) Olympus pediatric colonscope                            was introduced through the anus and advanced to the                            2 cm into the ileum. The colonoscopy was performed                            without difficulty. The patient tolerated the                            procedure well. The quality of the bowel                            preparation was good. The terminal ileum, ileocecal                            valve, appendiceal orifice, and rectum were                            photographed. Scope In: 9:56:47 AM Scope Out: 10:09:20 AM Scope Withdrawal Time: 0 hours 9 minutes 13 seconds  Total Procedure Duration: 0 hours 12 minutes 33 seconds  Findings:      A few rare small-mouthed diverticula were found in the sigmoid colon.      Non-bleeding internal hemorrhoids were found during retroflexion. The       hemorrhoids were small.      The terminal ileum appeared normal.      The exam was otherwise without abnormality on direct and retroflexion       views. Impression:               - Minimal sigmoid diverticulosis.                           -  Non-bleeding internal hemorrhoids.                           - Otherwise normal colonoscopy to TI. Moderate Sedation:      Not Applicable - Patient had care per Anesthesia. Recommendation:           - Patient has a contact number available for                            emergencies. The signs and symptoms of potential                            delayed complications were discussed with the                            patient. Return to normal activities tomorrow.                            Written discharge instructions were provided to the                            patient.                           - Resume previous diet.                           - Resume Coumadin (warfarin) at prior dose  today.                            Resume Lovenox as per cardiology from today onwards.                           - Repeat colonoscopy is not recommended due to                            current age (47 years or older) for screening                            purposes.                           - Return to GI clinic PRN.                           - The findings and recommendations were discussed                            with the patient's Sister Melody Torres. Procedure Code(s):        --- Professional ---                           [id number], Colonoscopy, flexible; diagnostic, including                            collection of specimen(s) by brushing  or washing,                            when performed (separate procedure) Diagnosis Code(s):        --- Professional ---                           K64.8, Other hemorrhoids                           R19.5, Other fecal abnormalities                           K57.30, Diverticulosis of large intestine without                            perforation or abscess without bleeding CPT copyright 2019 [employer]. All rights reserved. The codes documented in this report are preliminary and upon coder review may  be revised to meet current compliance requirements. Lynann Bologna, MD 06/17/2020 10:14:19 AM This report has been signed electronically. Number of Addenda: 0

## 2020-06-17 NOTE — Anesthesia Preprocedure Evaluation (Signed)
Anesthesia Evaluation  Patient identified by MRN, date of birth, ID band Patient awake    Reviewed: Allergy & Precautions, NPO status , Patient's Chart, lab work & pertinent test results  Airway Mallampati: I       Dental no notable dental hx. (+) Teeth Intact   Pulmonary neg pulmonary ROS,    Pulmonary exam normal        Cardiovascular Normal cardiovascular exam+ Valvular Problems/Murmurs MR   S/P MVR   Neuro/Psych PSYCHIATRIC DISORDERS Anxiety Depression negative neurological ROS     GI/Hepatic Neg liver ROS, GERD  Medicated and Controlled,  Endo/Other  negative endocrine ROS  Renal/GU negative Renal ROS  negative genitourinary   Musculoskeletal negative musculoskeletal ROS (+)   Abdominal Normal abdominal exam  (+)   Peds  Hematology   Anesthesia Other Findings  Park Liter, MD  05/13/2020 8:43 AM EDT   Echocardiogram showed ejection fraction 50 to 55%, prosthetic valve seems to be functioning properly overall looks good      Reproductive/Obstetrics                             Anesthesia Physical Anesthesia Plan  ASA: II  Anesthesia Plan: MAC   Post-op Pain Management:    Induction:   PONV Risk Score and Plan: Propofol infusion  Airway Management Planned: Natural Airway, Simple Face Mask and Nasal Cannula  Additional Equipment: None  Intra-op Plan:   Post-operative Plan:   Informed Consent: I have reviewed the patients History and Physical, chart, labs and discussed the procedure including the risks, benefits and alternatives for the proposed anesthesia with the patient or authorized representative who has indicated his/her understanding and acceptance.       Plan Discussed with:   Anesthesia Plan Comments:         Anesthesia Quick Evaluation

## 2020-06-17 NOTE — Discharge Instructions (Signed)

## 2020-06-17 NOTE — H&P (Signed)
Positive Cologuard Cleared by cardiology For colonoscopy today Check PT before No need for Lovenox cardiology RG      Signed       Show:Clear all [x] Manual[x] Template[] Copied  Added by: [x] , MD  [] Hover for details    Chief Complaint:   Referring Provider:  Street, , *      ASSESSMENT AND PLAN;   #1. Colorectal cancer screening  #2.  Constipation.    #3. S/P MVR (mech valve) on coumadin, A Fib s/p MAZE, HLD, DM2, CHF  Plan:  -Cologuard test. -Increase milk intake. -Call if with any problems. -She does understand if Cologuard test is pos, she would need colonoscopy with heparin/Lovenox bridging.  We would need cardiology help regarding that.    HPI:    Melody Torres is a 64 y.o. female  S/P MVR (mech valve) on coumadin, A Fib s/p MAZE, HLD, DM2, CHF Had constipation with fosamax, took stool softners, metamucil Off fosamax, better now. Constipation is even better if she drinks milk and has high-fiber diet. She does drink plenty of water.  No upper GI symptoms including nausea, vomiting, heartburn, regurgitation, odynophagia or dysphagia.  No fever chills or night sweats.  No recent weight loss.  She has been closely followed by Dr. Stephanie Coup from cardiology standpoint.  Past GI procedures -Colon (PCF) 05/2008 neg except div. -EGD 04/2011: Schatzki's ring s/p dilatation 50 fr, small HH, incidental gastric polyps, DUs. Neg CLO, neg SB Bx, gastric polyps -fundic gland polyps.     Past Medical History:  Diagnosis Date  . Anxiety and depression   . CHF (congestive heart failure) (HCC) 2017  . Chronic anticoagulation   . Diabetes mellitus without complication (HCC)    type 2  . GERD (gastroesophageal reflux disease)   . Hyperlipidemia   . Hyperlipidemia    no meds, diet controlled  . IBS (irritable bowel syndrome)   . Osteoporosis   . Paroxysmal atrial fibrillation (HCC) 01/20/2016   Overview:  Chads  score 2 anticoagulated with warfarin  . Rheumatic heart disease   . Rheumatic mitral regurgitation 01/20/2016   Overview:  Mild to moderate  . S/P Minimally invasive maze operation for atrial fibrillation 05/24/2019   Complete bilateral atrial lesion set using cryothermy and bipolar radiofrequency ablation with clipping of LA appendage via right mini-thoracotomy approach  . S/P minimally invasive mitral valve replacement with mechanical valve 05/24/2019   33 mm Sorin Carbomedics bileaflet mechanical valve via right mini thoracotomy approach  . Severe mitral valve stenosis 01/20/2016  . Tricuspid regurgitation          Past Surgical History:  Procedure Laterality Date  . CLIPPING OF ATRIAL APPENDAGE N/A 05/24/2019   Procedure: Clipping Of Atrial Appendage Using AtriCure PRO2 clip size 47mm;  Surgeon: 01/22/2016, MD;  Location: [facility] OR;  Service: Open Heart Surgery;  Laterality: N/A;  . COLONOSCOPY  06/14/2008   Mild sigmoid diverticulosis. Otherwise normal colonoscopy  . ESOPHAGOGASTRODUODENOSCOPY  05/18/2011   Schatzki ring status post esophageal dilatation. Small hiatal hernia. Incidential gastic polyps (status post polypectomy x2). Duodenal ulcers.   [facility] MINIMALLY INVASIVE MAZE PROCEDURE N/A 05/24/2019   Procedure: MINIMALLY INVASIVE MAZE PROCEDURE;  Surgeon: 05/20/2011, MD;  Location: [facility] OR;  Service: Open Heart Surgery;  Laterality: N/A;  . MITRAL VALVE REPLACEMENT Right 05/24/2019   Procedure: MINIMALLY INVASIVE MITRAL VALVE (MV) REPLACEMENT Using Carbomedics Optiform Heart Valve Size 37mm;  Surgeon: [facility], MD;  Location: [facility] OR;  Service: Open  Heart Surgery;  Laterality: Right;  . RIGHT/LEFT HEART CATH AND CORONARY ANGIOGRAPHY N/A 04/07/2019   Procedure: RIGHT/LEFT HEART CATH AND CORONARY ANGIOGRAPHY;  Surgeon: Marykay Lex, MD;  Location: [facility] INVASIVE CV LAB;  Service: Cardiovascular;  Laterality: N/A;  . TEE WITHOUT CARDIOVERSION N/A 01/06/2019    Procedure: TRANSESOPHAGEAL ECHOCARDIOGRAM (TEE);  Surgeon: Lars Masson, MD;  Location: [facility] ENDOSCOPY;  Service: Cardiovascular;  Laterality: N/A;  . TEE WITHOUT CARDIOVERSION N/A 05/24/2019   Procedure: TRANSESOPHAGEAL ECHOCARDIOGRAM (TEE);  Surgeon: Purcell Nails, MD;  Location: [facility] OR;  Service: Open Heart Surgery;  Laterality: N/A;  . WISDOM TOOTH EXTRACTION           Family History  Problem Relation Age of Onset  . Bladder Cancer Mother   . Heart attack Mother   . Hypothyroidism Mother   . Heart disease Father   . Stroke Father   . Diabetes Father   . Colon cancer Neg Hx   . Esophageal cancer Neg Hx     Social History       Tobacco Use  . Smoking status: Never Smoker  . Smokeless tobacco: Never Used  Vaping Use  . Vaping Use: Never used  Substance Use Topics  . Alcohol use: No  . Drug use: No          Current Outpatient Medications  Medication Sig Dispense Refill  . ACCU-CHEK AVIVA PLUS test strip USE TO check blood sugar TWICE (2) DAILY    . aspirin EC 81 MG EC tablet Take 1 tablet (81 mg total) by mouth daily.    Marland Kitchen atorvastatin (LIPITOR) 20 MG tablet Take 1 tablet (20 mg total) by mouth daily. 90 tablet 3  . Calcium 250 MG CAPS Take 1 capsule by mouth 2 (two) times daily.    . Chromium (CHROMEMATE PO) Take 600 mg by mouth daily.     . clobetasol cream (TEMOVATE) 0.05 % Apply 1 application topically 2 (two) times daily as needed (for lichen sclerosus).     Marland Kitchen loratadine (CLARITIN) 10 MG tablet Take 10 mg by mouth daily.    . Multiple Vitamin (MULTI-VITAMIN DAILY) TABS Take 2 tablets by mouth daily.     Marland Kitchen omeprazole (PRILOSEC) 20 MG capsule Take 20 mg by mouth daily as needed (for acid reflux or heartburn).     . warfarin (COUMADIN) 5 MG tablet TAKE 2-3 TABLETS BY MOUTH DAILY AS DIRECTED by coumadin clinic 180 tablet 0   No current facility-administered medications for this visit.         Allergies  Allergen Reactions    . Adhesive [Tape] Other (See Comments)    Skin irritation  . Penicillins Rash and Other (See Comments)    Did it involve swelling of the face/tongue/throat, SOB, or low BP? No Did it involve sudden or severe rash/hives, skin peeling, or any reaction on the inside of your mouth or nose? No Did you need to seek medical attention at a hospital or doctor's office? No When did it last happen?childhood allergy If all above answers are "NO", may proceed with cephalosporin use.     Review of Systems:  neg     Physical Exam:    BP 126/82   Pulse 79   Temp (!) 96.6 F (35.9 C)   Ht 5' 1.5" (1.562 m)   Wt 156 lb 6 oz (70.9 kg)   LMP  (LMP Unknown)   BMI 29.07 kg/m     Wt Readings from Last 3  Encounters:  04/15/20 156 lb 6 oz (70.9 kg)  04/09/20 156 lb 3.2 oz (70.9 kg)  11/14/19 143 lb (64.9 kg)   Constitutional:  Well-developed, in no acute distress. Psychiatric: Normal mood and affect. Behavior is normal. HEENT: Pupils normal.  Conjunctivae are normal. No scleral icterus. Neck supple.  Cardiovascular: Normal rate, regular rhythm. No edema.  Mechanical click Pulmonary/chest: Effort normal and breath sounds normal. No wheezing, rales or rhonchi. Abdominal: Soft, nondistended. Nontender. Bowel sounds active throughout. There are no masses palpable. No hepatomegaly. Rectal:  defered Neurological: Alert and oriented to person place and time. Skin: Skin is warm and dry. No rashes noted.    Edman Circle, MD 04/15/2020, 3:11 PM  Cc: Street, Stephanie Coup, *          Electronically signed by Lynann Bologna, MD at 04/15/2020 4:40 PM  Office Visit on 04/15/2020   Office Visit on 04/15/2020     Detailed Report    Note viewed by patient

## 2020-06-17 NOTE — Transfer of Care (Signed)
Immediate Anesthesia Transfer of Care Note  Patient: Melody Torres  Procedure(s) Performed: COLONOSCOPY WITH PROPOFOL (N/A )  Patient Location: Endoscopy Unit  Anesthesia Type:MAC  Level of Consciousness: drowsy and patient cooperative  Airway & Oxygen Therapy: Patient Spontanous Breathing and Patient connected to face mask oxygen  Post-op Assessment: Report given to RN and Post -op Vital signs reviewed and stable  Post vital signs: Reviewed and stable  Last Vitals:  Vitals Value Taken Time  BP 108/57 06/17/20 1015  Temp    Pulse 76 06/17/20 1017  Resp 14 06/17/20 1017  SpO2 100 % 06/17/20 1017  Vitals shown include unvalidated device data.  Last Pain:  Vitals:   06/17/20 0735  TempSrc: Oral  PainSc: 0-No pain         Complications: No complications documented.

## 2020-06-18 ENCOUNTER — Encounter (HOSPITAL_COMMUNITY): Payer: Self-pay | Admitting: Gastroenterology

## 2020-06-25 ENCOUNTER — Other Ambulatory Visit: Payer: Self-pay

## 2020-06-25 ENCOUNTER — Ambulatory Visit (INDEPENDENT_AMBULATORY_CARE_PROVIDER_SITE_OTHER): Payer: BC Managed Care – PPO

## 2020-06-25 DIAGNOSIS — I099 Rheumatic heart disease, unspecified: Secondary | ICD-10-CM | POA: Diagnosis not present

## 2020-06-25 DIAGNOSIS — Z7901 Long term (current) use of anticoagulants: Secondary | ICD-10-CM | POA: Diagnosis not present

## 2020-06-25 DIAGNOSIS — Z5181 Encounter for therapeutic drug level monitoring: Secondary | ICD-10-CM

## 2020-06-25 DIAGNOSIS — I480 Paroxysmal atrial fibrillation: Secondary | ICD-10-CM

## 2020-06-25 LAB — POCT INR: INR: 2.3 (ref 2.0–3.0)

## 2020-06-25 NOTE — Patient Instructions (Signed)
Take 2.5 tablets today and then continue warfarin 2 tablets daily except 3 tablets every Monday.   Recheck in 2 weeks.   Call  office with any medication changes or additions [phone].

## 2020-06-28 DIAGNOSIS — Z20828 Contact with and (suspected) exposure to other viral communicable diseases: Secondary | ICD-10-CM | POA: Diagnosis not present

## 2020-06-28 DIAGNOSIS — Z20822 Contact with and (suspected) exposure to covid-19: Secondary | ICD-10-CM | POA: Diagnosis not present

## 2020-07-09 ENCOUNTER — Other Ambulatory Visit: Payer: Self-pay

## 2020-07-09 ENCOUNTER — Ambulatory Visit (INDEPENDENT_AMBULATORY_CARE_PROVIDER_SITE_OTHER): Payer: BC Managed Care – PPO

## 2020-07-09 DIAGNOSIS — I480 Paroxysmal atrial fibrillation: Secondary | ICD-10-CM | POA: Diagnosis not present

## 2020-07-09 DIAGNOSIS — Z7901 Long term (current) use of anticoagulants: Secondary | ICD-10-CM

## 2020-07-09 DIAGNOSIS — Z5181 Encounter for therapeutic drug level monitoring: Secondary | ICD-10-CM | POA: Diagnosis not present

## 2020-07-09 DIAGNOSIS — I099 Rheumatic heart disease, unspecified: Secondary | ICD-10-CM

## 2020-07-09 LAB — POCT INR: INR: 2.8 (ref 2.0–3.0)

## 2020-07-09 NOTE — Patient Instructions (Signed)
continue warfarin 2 tablets daily except 3 tablets every Monday.   Recheck in 4 weeks.   Call  office with any medication changes or additions [phone].

## 2020-07-16 DIAGNOSIS — M791 Myalgia, unspecified site: Secondary | ICD-10-CM | POA: Diagnosis not present

## 2020-07-16 DIAGNOSIS — E785 Hyperlipidemia, unspecified: Secondary | ICD-10-CM | POA: Diagnosis not present

## 2020-07-16 DIAGNOSIS — Z1159 Encounter for screening for other viral diseases: Secondary | ICD-10-CM | POA: Diagnosis not present

## 2020-07-16 DIAGNOSIS — R310 Gross hematuria: Secondary | ICD-10-CM | POA: Diagnosis not present

## 2020-07-16 DIAGNOSIS — Z79899 Other long term (current) drug therapy: Secondary | ICD-10-CM | POA: Diagnosis not present

## 2020-07-16 DIAGNOSIS — Z23 Encounter for immunization: Secondary | ICD-10-CM | POA: Diagnosis not present

## 2020-07-16 DIAGNOSIS — E1169 Type 2 diabetes mellitus with other specified complication: Secondary | ICD-10-CM | POA: Diagnosis not present

## 2020-07-16 DIAGNOSIS — T466X5A Adverse effect of antihyperlipidemic and antiarteriosclerotic drugs, initial encounter: Secondary | ICD-10-CM | POA: Diagnosis not present

## 2020-07-23 ENCOUNTER — Telehealth: Payer: Self-pay | Admitting: Cardiology

## 2020-07-23 NOTE — Telephone Encounter (Signed)
Patient is called to let you know that she was place on an antibiotic for 5 days.

## 2020-07-23 NOTE — Telephone Encounter (Signed)
Returned a call to pt and they stated that they are taking cipro I will route to michael dapp rn of the coumadin clinic to determine if there is a need to recheck any sooner.

## 2020-07-30 ENCOUNTER — Other Ambulatory Visit: Payer: Self-pay

## 2020-07-30 ENCOUNTER — Ambulatory Visit (INDEPENDENT_AMBULATORY_CARE_PROVIDER_SITE_OTHER): Payer: BC Managed Care – PPO

## 2020-07-30 DIAGNOSIS — I480 Paroxysmal atrial fibrillation: Secondary | ICD-10-CM | POA: Diagnosis not present

## 2020-07-30 DIAGNOSIS — Z7901 Long term (current) use of anticoagulants: Secondary | ICD-10-CM

## 2020-07-30 DIAGNOSIS — Z5181 Encounter for therapeutic drug level monitoring: Secondary | ICD-10-CM | POA: Diagnosis not present

## 2020-07-30 DIAGNOSIS — I099 Rheumatic heart disease, unspecified: Secondary | ICD-10-CM

## 2020-07-30 LAB — POCT INR: INR: 2.3 (ref 2.0–3.0)

## 2020-07-30 NOTE — Patient Instructions (Signed)
Take 3 tablets today and then continue warfarin 2 tablets daily except 3 tablets every Monday.   Recheck in 4 weeks.   Call  office with any medication changes or additions [phone].

## 2020-08-07 DIAGNOSIS — H31113 Age-related choroidal atrophy, bilateral: Secondary | ICD-10-CM | POA: Diagnosis not present

## 2020-08-22 DIAGNOSIS — Z23 Encounter for immunization: Secondary | ICD-10-CM | POA: Diagnosis not present

## 2020-08-27 ENCOUNTER — Ambulatory Visit (INDEPENDENT_AMBULATORY_CARE_PROVIDER_SITE_OTHER): Payer: BC Managed Care – PPO

## 2020-08-27 ENCOUNTER — Other Ambulatory Visit: Payer: Self-pay

## 2020-08-27 DIAGNOSIS — Z7901 Long term (current) use of anticoagulants: Secondary | ICD-10-CM

## 2020-08-27 DIAGNOSIS — Z5181 Encounter for therapeutic drug level monitoring: Secondary | ICD-10-CM

## 2020-08-27 DIAGNOSIS — I099 Rheumatic heart disease, unspecified: Secondary | ICD-10-CM

## 2020-08-27 DIAGNOSIS — I480 Paroxysmal atrial fibrillation: Secondary | ICD-10-CM | POA: Diagnosis not present

## 2020-08-27 LAB — POCT INR: INR: 4.1 — AB (ref 2.0–3.0)

## 2020-08-27 NOTE — Patient Instructions (Signed)
Take only 1 tablet tonight and then  continue warfarin 2 tablets daily except 3 tablets every Monday.   Recheck in 6 weeks.   Call  office with any medication changes or additions [phone].

## 2020-08-28 ENCOUNTER — Ambulatory Visit (HOSPITAL_COMMUNITY): Payer: BC Managed Care – PPO | Admitting: Nurse Practitioner

## 2020-08-28 ENCOUNTER — Encounter (HOSPITAL_COMMUNITY): Payer: Self-pay | Admitting: Nurse Practitioner

## 2020-08-28 ENCOUNTER — Ambulatory Visit (HOSPITAL_COMMUNITY)
Admission: RE | Admit: 2020-08-28 | Discharge: 2020-08-28 | Disposition: A | Discharge status: Home / Self Care | Payer: BC Managed Care – PPO | Source: Ambulatory Visit | Attending: Nurse Practitioner | Admitting: Nurse Practitioner

## 2020-08-28 VITALS — BP 120/76 | HR 75 | Ht 61.0 in | Wt 163.0 lb

## 2020-08-28 DIAGNOSIS — D6869 Other thrombophilia: Secondary | ICD-10-CM

## 2020-08-28 DIAGNOSIS — I480 Paroxysmal atrial fibrillation: Secondary | ICD-10-CM

## 2020-08-28 DIAGNOSIS — Z7901 Long term (current) use of anticoagulants: Secondary | ICD-10-CM | POA: Diagnosis not present

## 2020-08-28 DIAGNOSIS — Z952 Presence of prosthetic heart valve: Secondary | ICD-10-CM | POA: Insufficient documentation

## 2020-08-28 DIAGNOSIS — Z954 Presence of other heart-valve replacement: Secondary | ICD-10-CM | POA: Diagnosis not present

## 2020-08-28 DIAGNOSIS — I4891 Unspecified atrial fibrillation: Secondary | ICD-10-CM | POA: Diagnosis not present

## 2020-08-28 NOTE — Progress Notes (Signed)
Primary Care Physician: Street, Stephanie Coup, MD Referring Physician: Dr. Lou Cal Melody Torres is a 64 y.o. female with a h/o  longstanding rheumatic heart disease who returns to the office today for routine follow-up status post minimally invasive mitral valve replacement using a mechanical prosthetic valve and maze procedure on May 23, 2020 for severe mitral stenosis, mitral regurgitation, chronic diastolic congestive heart failure, and longstanding persistent atrial fibrillation.  She is now in the hospital for long term surveillance of Maze procedure to maintain SR. She reports that she is doing well staying in SR.She is off all antiarrhythmics.  She will feel short burst  of  rapid beats in succession occassionally but will stop on their own. She  feels much improved since valve surgery. Continues on warfarin  for mechanical valve. Denies any shortness of breath.   Today, she denies symptoms of palpitations, chest pain, shortness of breath, orthopnea, PND, lower extremity edema, dizziness, presyncope, syncope, or neurologic sequela. The patient is tolerating medications without difficulties and is otherwise without complaint today.   Past Medical History:  Diagnosis Date   Anxiety and depression    CHF (congestive heart failure) (HCC) 2017   Chronic anticoagulation    GERD (gastroesophageal reflux disease)    Hyperlipidemia    Hyperlipidemia    no meds, diet controlled   IBS (irritable bowel syndrome)    Osteoporosis    Paroxysmal atrial fibrillation (HCC) 01/20/2016   Overview:  Chads score 2 anticoagulated with warfarin   Rheumatic heart disease    Rheumatic mitral regurgitation 01/20/2016   Overview:  Mild to moderate   S/P Minimally invasive maze operation for atrial fibrillation 05/24/2019   Complete bilateral atrial lesion set using cryothermy and bipolar radiofrequency ablation with clipping of LA appendage via right mini-thoracotomy approach   S/P  minimally invasive mitral valve replacement with mechanical valve 05/24/2019   33 mm Sorin Carbomedics bileaflet mechanical valve via right mini thoracotomy approach   Severe mitral valve stenosis 01/20/2016   Tricuspid regurgitation    Past Surgical History:  Procedure Laterality Date   CLIPPING OF ATRIAL APPENDAGE N/A 05/24/2019   Procedure: Clipping Of Atrial Appendage Using AtriCure PRO2 clip size 46mm;  Surgeon: Purcell Nails, MD;  Location: [facility] OR;  Service: Open Heart Surgery;  Laterality: N/A;   COLONOSCOPY  06/14/2008   Mild sigmoid diverticulosis. Otherwise normal colonoscopy   COLONOSCOPY WITH PROPOFOL N/A 06/17/2020   Procedure: COLONOSCOPY WITH PROPOFOL;  Surgeon: Lynann Bologna, MD;  Location: [facility] ENDOSCOPY;  Service: Endoscopy;  Laterality: N/A;   ESOPHAGOGASTRODUODENOSCOPY  05/18/2011   Schatzki ring status post esophageal dilatation. Small hiatal hernia. Incidential gastic polyps (status post polypectomy x2). Duodenal ulcers.    MINIMALLY INVASIVE MAZE PROCEDURE N/A 05/24/2019   Procedure: MINIMALLY INVASIVE MAZE PROCEDURE;  Surgeon: Purcell Nails, MD;  Location: [facility];  Service: Open Heart Surgery;  Laterality: N/A;   MITRAL VALVE REPLACEMENT Right 05/24/2019   Procedure: MINIMALLY INVASIVE MITRAL VALVE (MV) REPLACEMENT Using Carbomedics Optiform Heart Valve Size 8mm;  Surgeon: Purcell Nails, MD;  Location: [facility] OR;  Service: Open Heart Surgery;  Laterality: Right;   RIGHT/LEFT HEART CATH AND CORONARY ANGIOGRAPHY N/A 04/07/2019   Procedure: RIGHT/LEFT HEART CATH AND CORONARY ANGIOGRAPHY;  Surgeon: Marykay Lex, MD;  Location: [facility] INVASIVE CV LAB;  Service: Cardiovascular;  Laterality: N/A;   TEE WITHOUT CARDIOVERSION N/A 01/06/2019   Procedure: TRANSESOPHAGEAL ECHOCARDIOGRAM (TEE);  Surgeon: Lars Masson, MD;  Location: [facility] ENDOSCOPY;  Service:  Cardiovascular;  Laterality: N/A;   TEE WITHOUT CARDIOVERSION N/A 05/24/2019   Procedure: TRANSESOPHAGEAL  ECHOCARDIOGRAM (TEE);  Surgeon: Purcell Nails, MD;  Location: [facility] OR;  Service: Open Heart Surgery;  Laterality: N/A;   WISDOM TOOTH EXTRACTION      Current Outpatient Medications  Medication Sig Dispense Refill   ACCU-CHEK AVIVA PLUS test strip USE TO check blood sugar TWICE (2) DAILY     acetaminophen (TYLENOL) 500 MG tablet Take 500 mg by mouth as needed for moderate pain or headache.      aspirin EC 81 MG EC tablet Take 1 tablet (81 mg total) by mouth daily.     clobetasol cream (TEMOVATE) 0.05 % Apply 1 application topically 2 (two) times daily as needed (for lichen sclerosus).      loratadine (CLARITIN) 10 MG tablet Take 10 mg by mouth every other day.      metroNIDAZOLE (METROGEL) 0.75 % gel Apply 1 application topically at bedtime.     omeprazole (PRILOSEC) 20 MG capsule Take 20 mg by mouth daily as needed (for acid reflux or heartburn).      warfarin (COUMADIN) 5 MG tablet TAKE 2-3 TABLETS BY MOUTH DAILY AS DIRECTED BY COUMADIN CLINIC (Patient taking differently: Take 10-15 mg by mouth See admin instructions. Take 10 mg in the evening on on Sun, Tues, Wed, Thur, Fri, and Sat. Take 15 mg in the evening on Mon.) 180 tablet 0   atorvastatin (LIPITOR) 20 MG tablet Take 1 tablet (20 mg total) by mouth daily. (Patient taking differently: Take 20 mg by mouth every evening. ) 90 tablet 3   Chromium (CHROMEMATE PO) Take 600 mg by mouth daily.  (Patient not taking: Reported on 08/28/2020)     fluconazole (DIFLUCAN) 150 MG tablet  (Patient not taking: Reported on 08/28/2020)     No current facility-administered medications for this encounter.    Allergies  Allergen Reactions   Adhesive [Tape] Other (See Comments)    Skin irritation   Penicillins Rash and Other (See Comments)    Did it involve swelling of the face/tongue/throat, SOB, or low BP? No Did it involve sudden or severe rash/hives, skin peeling, or any reaction on the inside of your mouth or nose? No Did you need to  seek medical attention at a hospital or doctor's office? No When did it last happen?childhood allergy If all above answers are "NO", may proceed with cephalosporin use.     Social History   Socioeconomic History   Marital status: Widowed    Spouse name: Not on file   Number of children: Not on file   Years of education: Not on file   Highest education level: Not on file  Occupational History   Not on file  Tobacco Use   Smoking status: Never Smoker   Smokeless tobacco: Never Used  Vaping Use   Vaping Use: Never used  Substance and Sexual Activity   Alcohol use: No   Drug use: No   Sexual activity: Not on file  Other Topics Concern   Not on file  Social History Narrative   Not on file   Social Determinants of Health   Financial Resource Strain:    Difficulty of Paying Living Expenses: Not on file  Food Insecurity:    Worried About Running Out of Food in the Last Year: Not on file   Ran Out of Food in the Last Year: Not on file  Transportation Needs:    Lack of Transportation (Medical): Not  on file   Lack of Transportation (Non-Medical): Not on file  Physical Activity:    Days of Exercise per Week: Not on file   Minutes of Exercise per Session: Not on file  Stress:    Feeling of Stress : Not on file  Social Connections:    Frequency of Communication with Friends and Family: Not on file   Frequency of Social Gatherings with Friends and Family: Not on file   Attends Religious Services: Not on file   Active Member of Clubs or Organizations: Not on file   Attends Banker Meetings: Not on file   Marital Status: Not on file  Intimate Partner Violence:    Fear of Current or Ex-Partner: Not on file   Emotionally Abused: Not on file   Physically Abused: Not on file   Sexually Abused: Not on file    Family History  Problem Relation Age of Onset   Bladder Cancer Mother    Heart attack Mother    Hypothyroidism  Mother    Heart disease Father    Stroke Father    Diabetes Father    Colon cancer Neg Hx    Esophageal cancer Neg Hx    Stomach cancer Neg Hx    Rectal cancer Neg Hx     ROS- All systems are reviewed and negative except as per the HPI above  Physical Exam: Vitals:   08/28/20 1451  BP: 120/76  Pulse: 75  Weight: 73.9 kg  Height: 5\' 1"  (1.549 m)   Wt Readings from Last 3 Encounters:  08/28/20 73.9 kg  06/17/20 69.4 kg  06/06/20 69.4 kg    Labs: Lab Results  Component Value Date   NA 142 06/06/2020   K 4.1 06/06/2020   CL 103 06/06/2020   CO2 33 (H) 06/06/2020   GLUCOSE 99 06/06/2020   BUN 19 06/06/2020   CREATININE 0.83 06/06/2020   CALCIUM 10.0 06/06/2020   MG 2.4 05/25/2019   Lab Results  Component Value Date   INR 4.1 (A) 08/27/2020   Lab Results  Component Value Date   CHOL 203 (H) 03/08/2020   HDL 46 03/08/2020   LDLCALC 127 (H) 03/08/2020   TRIG 168 (H) 03/08/2020     GEN- The patient is well appearing, alert and oriented x 3 today.   Head- normocephalic, atraumatic Eyes-  Sclera clear, conjunctiva pink Ears- hearing intact Oropharynx- clear Neck- supple, no JVP Lymph- no cervical lymphadenopathy Lungs- Clear to ausculation bilaterally, normal work of breathing Heart- Regular rate and rhythm, no murmurs, rubs or gallops, PMI not laterally displaced GI- soft, NT, ND, + BS Extremities- no clubbing, cyanosis, or edema MS- no significant deformity or atrophy Skin- no rash or lesion Psych- euthymic mood, full affect Neuro- strength and sensation are intact  EKG-NSR at 75 bpm, pr int 138 ms, qrs int 88 ms, qtc 453 ms     Assessment and Plan: 1.  Status post minimally invasive mitral valve replacement using a mechanical prosthetic valve and maze procedure on May 23, 2020 Off antiarrythmic's Staying in SR  Improved since surgery  Continue on warfarin for mechanical valve  \  F/u in one year   May 25, 2020 C. Lupita Leash Afib  [facility] [address] [place], [place] [phone]

## 2020-09-13 ENCOUNTER — Other Ambulatory Visit: Payer: Self-pay | Admitting: Cardiology

## 2020-10-07 ENCOUNTER — Other Ambulatory Visit: Payer: Self-pay

## 2020-10-07 DIAGNOSIS — Z7901 Long term (current) use of anticoagulants: Secondary | ICD-10-CM | POA: Insufficient documentation

## 2020-10-07 DIAGNOSIS — E785 Hyperlipidemia, unspecified: Secondary | ICD-10-CM | POA: Insufficient documentation

## 2020-10-07 DIAGNOSIS — F32A Depression, unspecified: Secondary | ICD-10-CM | POA: Insufficient documentation

## 2020-10-07 DIAGNOSIS — K219 Gastro-esophageal reflux disease without esophagitis: Secondary | ICD-10-CM | POA: Insufficient documentation

## 2020-10-07 DIAGNOSIS — F419 Anxiety disorder, unspecified: Secondary | ICD-10-CM | POA: Insufficient documentation

## 2020-10-07 DIAGNOSIS — K589 Irritable bowel syndrome without diarrhea: Secondary | ICD-10-CM | POA: Insufficient documentation

## 2020-10-07 DIAGNOSIS — M810 Age-related osteoporosis without current pathological fracture: Secondary | ICD-10-CM | POA: Insufficient documentation

## 2020-10-08 ENCOUNTER — Encounter: Payer: Self-pay | Admitting: Cardiology

## 2020-10-08 ENCOUNTER — Ambulatory Visit (INDEPENDENT_AMBULATORY_CARE_PROVIDER_SITE_OTHER): Payer: BC Managed Care – PPO

## 2020-10-08 ENCOUNTER — Other Ambulatory Visit: Payer: Self-pay

## 2020-10-08 ENCOUNTER — Ambulatory Visit (INDEPENDENT_AMBULATORY_CARE_PROVIDER_SITE_OTHER): Payer: BC Managed Care – PPO | Admitting: Cardiology

## 2020-10-08 VITALS — BP 124/84 | HR 76 | Ht 61.5 in | Wt 163.0 lb

## 2020-10-08 DIAGNOSIS — Z7901 Long term (current) use of anticoagulants: Secondary | ICD-10-CM

## 2020-10-08 DIAGNOSIS — Z5181 Encounter for therapeutic drug level monitoring: Secondary | ICD-10-CM | POA: Diagnosis not present

## 2020-10-08 DIAGNOSIS — I099 Rheumatic heart disease, unspecified: Secondary | ICD-10-CM

## 2020-10-08 DIAGNOSIS — Z954 Presence of other heart-valve replacement: Secondary | ICD-10-CM | POA: Diagnosis not present

## 2020-10-08 DIAGNOSIS — I059 Rheumatic mitral valve disease, unspecified: Secondary | ICD-10-CM

## 2020-10-08 DIAGNOSIS — Z8679 Personal history of other diseases of the circulatory system: Secondary | ICD-10-CM

## 2020-10-08 DIAGNOSIS — I480 Paroxysmal atrial fibrillation: Secondary | ICD-10-CM

## 2020-10-08 DIAGNOSIS — Z9889 Other specified postprocedural states: Secondary | ICD-10-CM | POA: Diagnosis not present

## 2020-10-08 LAB — POCT INR: INR: 2.5 (ref 2.0–3.0)

## 2020-10-08 NOTE — Patient Instructions (Signed)
continue warfarin 2 tablets daily except 3 tablets every Monday.   Recheck in 6 weeks.   Call  office with any medication changes or additions [phone].    

## 2020-10-08 NOTE — Progress Notes (Signed)
Cardiology Office Note:    Date:  10/08/2020   ID:  Melody Torres, DOB [birth year], MRN [id number]  PCP:  Street, Stephanie Coup, MD  Cardiologist:  Gypsy Balsam, MD    Referring MD: Street, Stephanie Coup, *   Chief Complaint  Patient presents with  . Follow-up  I am doing well  History of Present Illness:    Melody Torres is a 64 y.o. female with past medical history significant for rheumatic fever, status post mitral valve replacement via minimal approach with [facility] Jude prosthesis, also Maze procedure.  That was done in December.  Comes today to my office for follow-up.  Overall doing very well.  Denies have any chest pain tightness squeezing pressure burning chest she got much more energy there is no shortness of breath does not have any irregularity of her heart rate overall she is very satisfied with the way she feels.  Past Medical History:  Diagnosis Date  . Anticoagulation adequate with anticoagulant therapy 01/20/2016  . Anxiety and depression   . CHF (congestive heart failure) (HCC) 2017  . Chronic anticoagulation   . Compression fracture of thoracic spine, non-traumatic (HCC) 09/08/2019  . Encounter for screening colonoscopy   . Encounter for therapeutic drug monitoring 04/30/2017  . GERD (gastroesophageal reflux disease)   . Hypercholesterolemia 12/05/2018  . Hyperlipidemia   . Hyperlipidemia    no meds, diet controlled  . IBS (irritable bowel syndrome)   . Long term (current) use of anticoagulants [Z79.01] 04/30/2017  . Mitral valve disease, rheumatic 12/05/2018   Formatting of this note might be different from the original. TTE 12/19: Left ventricle: The cavity size was normal. There was mild  concentric hypertrophy. Systolic function was normal. The  estimated ejection fraction was in the range of 55% to 60%. Wall  motion was normal; there were no regional wall motion  abnormalities. - Aortic valve: Valve area (VTI): 1.67 cm^2. Valve area (Vmax):  1.  .  Obesity with body mass index 30 or greater 11/14/2019  . Osteoporosis   . Pain in thoracic spine 08/22/2019  . Paroxysmal atrial fibrillation (HCC) 01/20/2016   Overview:  Chads score 2 anticoagulated with warfarin  . Positive colorectal cancer screening using Cologuard test   . Rheumatic heart disease   . Rheumatic mitral regurgitation 01/20/2016   Overview:  Mild to moderate  . S/P Minimally invasive maze operation for atrial fibrillation 05/24/2019   Complete bilateral atrial lesion set using cryothermy and bipolar radiofrequency ablation with clipping of LA appendage via right mini-thoracotomy approach  . S/P minimally invasive mitral valve replacement with mechanical valve 05/24/2019   33 mm Sorin Carbomedics bileaflet mechanical valve via right mini thoracotomy approach  . Severe mitral valve stenosis 01/20/2016  . Tricuspid regurgitation     Past Surgical History:  Procedure Laterality Date  . CLIPPING OF ATRIAL APPENDAGE N/A 05/24/2019   Procedure: Clipping Of Atrial Appendage Using AtriCure PRO2 clip size 74mm;  Surgeon: Purcell Nails, MD;  Location: [facility] OR;  Service: Open Heart Surgery;  Laterality: N/A;  . COLONOSCOPY  06/14/2008   Mild sigmoid diverticulosis. Otherwise normal colonoscopy  . COLONOSCOPY WITH PROPOFOL N/A 06/17/2020   Procedure: COLONOSCOPY WITH PROPOFOL;  Surgeon: Lynann Bologna, MD;  Location: [facility] ENDOSCOPY;  Service: Endoscopy;  Laterality: N/A;  . ESOPHAGOGASTRODUODENOSCOPY  05/18/2011   Schatzki ring status post esophageal dilatation. Small hiatal hernia. Incidential gastic polyps (status post polypectomy x2). Duodenal ulcers.   Marland Kitchen MINIMALLY INVASIVE MAZE PROCEDURE N/A 05/24/2019  Procedure: MINIMALLY INVASIVE MAZE PROCEDURE;  Surgeon: Purcell Nails, MD;  Location: [facility] OR;  Service: Open Heart Surgery;  Laterality: N/A;  . MITRAL VALVE REPLACEMENT Right 05/24/2019   Procedure: MINIMALLY INVASIVE MITRAL VALVE (MV) REPLACEMENT Using Carbomedics Optiform Heart Valve  Size 77mm;  Surgeon: Purcell Nails, MD;  Location: [facility] OR;  Service: Open Heart Surgery;  Laterality: Right;  . RIGHT/LEFT HEART CATH AND CORONARY ANGIOGRAPHY N/A 04/07/2019   Procedure: RIGHT/LEFT HEART CATH AND CORONARY ANGIOGRAPHY;  Surgeon: Marykay Lex, MD;  Location: [facility] INVASIVE CV LAB;  Service: Cardiovascular;  Laterality: N/A;  . TEE WITHOUT CARDIOVERSION N/A 01/06/2019   Procedure: TRANSESOPHAGEAL ECHOCARDIOGRAM (TEE);  Surgeon: Lars Masson, MD;  Location: [facility] ENDOSCOPY;  Service: Cardiovascular;  Laterality: N/A;  . TEE WITHOUT CARDIOVERSION N/A 05/24/2019   Procedure: TRANSESOPHAGEAL ECHOCARDIOGRAM (TEE);  Surgeon: Purcell Nails, MD;  Location: [facility] OR;  Service: Open Heart Surgery;  Laterality: N/A;  . WISDOM TOOTH EXTRACTION      Current Medications: Current Meds  Medication Sig  . acetaminophen (TYLENOL) 500 MG tablet Take 500 mg by mouth as needed for moderate pain or headache.   Marland Kitchen aspirin EC 81 MG EC tablet Take 1 tablet (81 mg total) by mouth daily.  Marland Kitchen atorvastatin (LIPITOR) 20 MG tablet Take 1 tablet (20 mg total) by mouth daily. (Patient taking differently: Take 20 mg by mouth every evening.)  . clobetasol cream (TEMOVATE) 0.05 % Apply 1 application topically 2 (two) times daily as needed (for lichen sclerosus).   Marland Kitchen loratadine (CLARITIN) 10 MG tablet Take 10 mg by mouth every other day.   . metroNIDAZOLE (METROGEL) 0.75 % gel Apply 1 application topically at bedtime.  . Multiple Vitamin (MULTIVITAMIN) tablet Take 1 tablet by mouth daily.  Marland Kitchen omeprazole (PRILOSEC) 20 MG capsule Take 20 mg by mouth daily as needed (for acid reflux or heartburn).   . warfarin (COUMADIN) 5 MG tablet TAKE 2-3 TABLETS BY MOUTH DAILY AS DIRECTED BY COUMADIN CLINIC     Allergies:   Adhesive [tape] and Penicillins   Social History   Socioeconomic History  . Marital status: Widowed    Spouse name: Not on file  . Number of children: Not on file  . Years of education: Not on file  .  Highest education level: Not on file  Occupational History  . Not on file  Tobacco Use  . Smoking status: Never Smoker  . Smokeless tobacco: Never Used  Vaping Use  . Vaping Use: Never used  Substance and Sexual Activity  . Alcohol use: No  . Drug use: No  . Sexual activity: Not on file  Other Topics Concern  . Not on file  Social History Narrative  . Not on file   Social Determinants of Health   Financial Resource Strain: Not on file  Food Insecurity: Not on file  Transportation Needs: Not on file  Physical Activity: Not on file  Stress: Not on file  Social Connections: Not on file     Family History: The patient's family history includes Bladder Cancer in her mother; Diabetes in her father; Heart attack in her mother; Heart disease in her father; Hypothyroidism in her mother; Stroke in her father. There is no history of Colon cancer, Esophageal cancer, Stomach cancer, or Rectal cancer. ROS:   Please see the history of present illness.    All 14 point review of systems negative except as described per history of present illness  EKGs/Labs/Other Studies Reviewed:  Recent Labs: 06/06/2020: ALT 32; BUN 19; Creatinine, Ser 0.83; Hemoglobin 14.2; Platelets 183.0; Potassium 4.1; Sodium 142  Recent Lipid Panel    Component Value Date/Time   CHOL 203 (H) 03/08/2020 0856   TRIG 168 (H) 03/08/2020 0856   HDL 46 03/08/2020 0856   CHOLHDL 4.4 03/08/2020 0856   LDLCALC 127 (H) 03/08/2020 0856    Physical Exam:    VS:  BP 124/84 (BP Location: Left Arm, Patient Position: Sitting)   Pulse 76   Ht 5' 1.5" (1.562 m)   Wt 163 lb (73.9 kg)   LMP  (LMP Unknown)   SpO2 97%   BMI 30.30 kg/m     Wt Readings from Last 3 Encounters:  10/08/20 163 lb (73.9 kg)  08/28/20 163 lb (73.9 kg)  06/17/20 153 lb (69.4 kg)     GEN:  Well nourished, well developed in no acute distress HEENT: Normal NECK: No JVD; No carotid bruits LYMPHATICS: No lymphadenopathy CARDIAC: RRR, crisp  mechanical valve sounds, no murmurs, no rubs, no gallops RESPIRATORY:  Clear to auscultation without rales, wheezing or rhonchi  ABDOMEN: Soft, non-tender, non-distended MUSCULOSKELETAL:  No edema; No deformity  SKIN: Warm and dry LOWER EXTREMITIES: no swelling NEUROLOGIC:  Alert and oriented x 3 PSYCHIATRIC:  Normal affect   ASSESSMENT:    1. S/P Minimally invasive maze operation for atrial fibrillation   2. Long term (current) use of anticoagulants [Z79.01]   3. S/P minimally invasive mitral valve replacement with mechanical valve    4. Mitral valve disease, rheumatic    PLAN:    In order of problems listed above:  1. Status post minimally invasive maze procedure with mitral valve replacement.  Mechanical prosthesis doing well asymptomatic very satisfied with the care she got.  We did talk about need to use endocarditis prophylaxis for any dental work, we did talk about need to use Coumadin which is very much aware of.  Her target INR is 3. 2. Long-term anticoagulant use plan as described above we will continue with Coumadin.  She understand why newer agent cannot be used. 3. Paroxysmal atrial fibrillation, she did have Maze procedure maintaining sinus rhythm will continue anticoagulation. 4. Dyslipidemia: She is on Lipitor which I will continue.  I did review her K PN I have data from 07/16/2020 which showing LDL of 61 HDL 35.  We will continue present management.   Medication Adjustments/Labs and Tests Ordered: Current medicines are reviewed at length with the patient today.  Concerns regarding medicines are outlined above.  No orders of the defined types were placed in this encounter.  Medication changes: No orders of the defined types were placed in this encounter.   Signed, Georgeanna Lea, MD, [facility] 10/08/2020 11:27 AM    [facility]

## 2020-10-08 NOTE — Patient Instructions (Signed)

## 2020-10-10 DIAGNOSIS — Z124 Encounter for screening for malignant neoplasm of cervix: Secondary | ICD-10-CM | POA: Diagnosis not present

## 2020-10-10 DIAGNOSIS — Z6828 Body mass index (BMI) 28.0-28.9, adult: Secondary | ICD-10-CM | POA: Diagnosis not present

## 2020-10-10 DIAGNOSIS — Z01419 Encounter for gynecological examination (general) (routine) without abnormal findings: Secondary | ICD-10-CM | POA: Diagnosis not present

## 2020-10-14 DIAGNOSIS — H1013 Acute atopic conjunctivitis, bilateral: Secondary | ICD-10-CM | POA: Diagnosis not present

## 2020-11-03 DIAGNOSIS — Z20822 Contact with and (suspected) exposure to covid-19: Secondary | ICD-10-CM | POA: Diagnosis not present

## 2020-11-19 ENCOUNTER — Other Ambulatory Visit: Payer: Self-pay

## 2020-11-19 ENCOUNTER — Ambulatory Visit (INDEPENDENT_AMBULATORY_CARE_PROVIDER_SITE_OTHER): Payer: BC Managed Care – PPO

## 2020-11-19 DIAGNOSIS — I480 Paroxysmal atrial fibrillation: Secondary | ICD-10-CM

## 2020-11-19 DIAGNOSIS — Z5181 Encounter for therapeutic drug level monitoring: Secondary | ICD-10-CM | POA: Diagnosis not present

## 2020-11-19 DIAGNOSIS — I099 Rheumatic heart disease, unspecified: Secondary | ICD-10-CM

## 2020-11-19 DIAGNOSIS — Z1231 Encounter for screening mammogram for malignant neoplasm of breast: Secondary | ICD-10-CM | POA: Diagnosis not present

## 2020-11-19 DIAGNOSIS — Z7901 Long term (current) use of anticoagulants: Secondary | ICD-10-CM | POA: Diagnosis not present

## 2020-11-19 LAB — POCT INR: INR: 3.7 — AB (ref 2.0–3.0)

## 2020-11-19 NOTE — Patient Instructions (Signed)
continue warfarin 2 tablets daily except 3 tablets every Monday.   Recheck in 6 weeks.   Call  office with any medication changes or additions [phone].

## 2020-11-26 ENCOUNTER — Other Ambulatory Visit: Payer: Self-pay | Admitting: Cardiology

## 2020-12-16 DIAGNOSIS — E1169 Type 2 diabetes mellitus with other specified complication: Secondary | ICD-10-CM | POA: Diagnosis not present

## 2020-12-16 DIAGNOSIS — T466X5A Adverse effect of antihyperlipidemic and antiarteriosclerotic drugs, initial encounter: Secondary | ICD-10-CM | POA: Diagnosis not present

## 2020-12-16 DIAGNOSIS — G720 Drug-induced myopathy: Secondary | ICD-10-CM | POA: Diagnosis not present

## 2020-12-16 DIAGNOSIS — E785 Hyperlipidemia, unspecified: Secondary | ICD-10-CM | POA: Diagnosis not present

## 2020-12-31 ENCOUNTER — Ambulatory Visit (INDEPENDENT_AMBULATORY_CARE_PROVIDER_SITE_OTHER): Payer: BC Managed Care – PPO

## 2020-12-31 ENCOUNTER — Other Ambulatory Visit: Payer: Self-pay

## 2020-12-31 DIAGNOSIS — Z6828 Body mass index (BMI) 28.0-28.9, adult: Secondary | ICD-10-CM | POA: Diagnosis not present

## 2020-12-31 DIAGNOSIS — Z7901 Long term (current) use of anticoagulants: Secondary | ICD-10-CM | POA: Diagnosis not present

## 2020-12-31 DIAGNOSIS — I099 Rheumatic heart disease, unspecified: Secondary | ICD-10-CM

## 2020-12-31 DIAGNOSIS — S0086XA Insect bite (nonvenomous) of other part of head, initial encounter: Secondary | ICD-10-CM | POA: Diagnosis not present

## 2020-12-31 DIAGNOSIS — Z5181 Encounter for therapeutic drug level monitoring: Secondary | ICD-10-CM

## 2020-12-31 DIAGNOSIS — I480 Paroxysmal atrial fibrillation: Secondary | ICD-10-CM | POA: Diagnosis not present

## 2020-12-31 DIAGNOSIS — W57XXXA Bitten or stung by nonvenomous insect and other nonvenomous arthropods, initial encounter: Secondary | ICD-10-CM | POA: Diagnosis not present

## 2020-12-31 LAB — POCT INR: INR: 4.5 — AB (ref 2.0–3.0)

## 2020-12-31 NOTE — Patient Instructions (Signed)
Take only 1 tonight and then Reduce to  2 tablets daily  Recheck in 4 weeks.   Call  office with any medication changes or additions [phone].

## 2021-01-28 ENCOUNTER — Other Ambulatory Visit: Payer: Self-pay

## 2021-01-28 ENCOUNTER — Ambulatory Visit (INDEPENDENT_AMBULATORY_CARE_PROVIDER_SITE_OTHER): Payer: BC Managed Care – PPO

## 2021-01-28 DIAGNOSIS — I099 Rheumatic heart disease, unspecified: Secondary | ICD-10-CM

## 2021-01-28 DIAGNOSIS — I480 Paroxysmal atrial fibrillation: Secondary | ICD-10-CM | POA: Diagnosis not present

## 2021-01-28 DIAGNOSIS — Z5181 Encounter for therapeutic drug level monitoring: Secondary | ICD-10-CM | POA: Diagnosis not present

## 2021-01-28 DIAGNOSIS — Z7901 Long term (current) use of anticoagulants: Secondary | ICD-10-CM

## 2021-01-28 LAB — POCT INR: INR: 4.7 — AB (ref 2.0–3.0)

## 2021-01-28 NOTE — Patient Instructions (Signed)
Take only 1 tonight and then Reduce to 2 tablets daily except 1 tablet on Wednesday  Recheck in 3 weeks.   Call  office with any medication changes or additions [phone].

## 2021-02-18 ENCOUNTER — Ambulatory Visit (INDEPENDENT_AMBULATORY_CARE_PROVIDER_SITE_OTHER): Payer: BC Managed Care – PPO

## 2021-02-18 ENCOUNTER — Other Ambulatory Visit: Payer: Self-pay

## 2021-02-18 DIAGNOSIS — I480 Paroxysmal atrial fibrillation: Secondary | ICD-10-CM | POA: Diagnosis not present

## 2021-02-18 DIAGNOSIS — Z5181 Encounter for therapeutic drug level monitoring: Secondary | ICD-10-CM | POA: Diagnosis not present

## 2021-02-18 DIAGNOSIS — Z7901 Long term (current) use of anticoagulants: Secondary | ICD-10-CM

## 2021-02-18 DIAGNOSIS — I099 Rheumatic heart disease, unspecified: Secondary | ICD-10-CM | POA: Diagnosis not present

## 2021-02-18 LAB — POCT INR: INR: 3.7 — AB (ref 2.0–3.0)

## 2021-02-18 NOTE — Patient Instructions (Signed)
Continue taking 2 tablets daily except 1 tablet on Wednesday  Recheck in 3 weeks.   Call  office with any medication changes or additions [phone].

## 2021-02-28 ENCOUNTER — Other Ambulatory Visit: Payer: Self-pay | Admitting: Cardiology

## 2021-03-04 ENCOUNTER — Ambulatory Visit (INDEPENDENT_AMBULATORY_CARE_PROVIDER_SITE_OTHER): Payer: Medicare Other

## 2021-03-04 ENCOUNTER — Other Ambulatory Visit: Payer: Self-pay

## 2021-03-04 DIAGNOSIS — Z7901 Long term (current) use of anticoagulants: Secondary | ICD-10-CM

## 2021-03-04 DIAGNOSIS — Z5181 Encounter for therapeutic drug level monitoring: Secondary | ICD-10-CM

## 2021-03-04 DIAGNOSIS — I480 Paroxysmal atrial fibrillation: Secondary | ICD-10-CM

## 2021-03-04 DIAGNOSIS — I099 Rheumatic heart disease, unspecified: Secondary | ICD-10-CM | POA: Diagnosis not present

## 2021-03-04 LAB — POCT INR: INR: 4 — AB (ref 2.0–3.0)

## 2021-03-04 NOTE — Patient Instructions (Signed)
Hold today only and then Continue taking 2 tablets daily except 1 tablet on Wednesday  Recheck in 5 weeks.   Call  office with any medication changes or additions [phone].

## 2021-04-04 ENCOUNTER — Other Ambulatory Visit: Payer: Self-pay

## 2021-04-08 ENCOUNTER — Ambulatory Visit (INDEPENDENT_AMBULATORY_CARE_PROVIDER_SITE_OTHER): Payer: Medicare Other

## 2021-04-08 ENCOUNTER — Other Ambulatory Visit: Payer: Self-pay

## 2021-04-08 DIAGNOSIS — I480 Paroxysmal atrial fibrillation: Secondary | ICD-10-CM | POA: Diagnosis not present

## 2021-04-08 DIAGNOSIS — I099 Rheumatic heart disease, unspecified: Secondary | ICD-10-CM | POA: Diagnosis not present

## 2021-04-08 DIAGNOSIS — Z5181 Encounter for therapeutic drug level monitoring: Secondary | ICD-10-CM | POA: Diagnosis not present

## 2021-04-08 DIAGNOSIS — Z7901 Long term (current) use of anticoagulants: Secondary | ICD-10-CM | POA: Diagnosis not present

## 2021-04-08 LAB — POCT INR: INR: 2.6 (ref 2.0–3.0)

## 2021-04-08 NOTE — Patient Instructions (Signed)
Continue taking 2 tablets daily except 1 tablet on Wednesday  Recheck in 3 weeks.   Call  office with any medication changes or additions [phone].

## 2021-04-24 ENCOUNTER — Other Ambulatory Visit: Payer: Self-pay

## 2021-04-29 ENCOUNTER — Other Ambulatory Visit: Payer: Self-pay

## 2021-04-29 ENCOUNTER — Encounter: Payer: Self-pay | Admitting: Cardiology

## 2021-04-29 ENCOUNTER — Ambulatory Visit (INDEPENDENT_AMBULATORY_CARE_PROVIDER_SITE_OTHER): Payer: Medicare Other

## 2021-04-29 ENCOUNTER — Ambulatory Visit (INDEPENDENT_AMBULATORY_CARE_PROVIDER_SITE_OTHER): Payer: Medicare Other | Admitting: Cardiology

## 2021-04-29 VITALS — BP 108/62 | HR 90 | Ht 61.5 in | Wt 167.0 lb

## 2021-04-29 DIAGNOSIS — Z9889 Other specified postprocedural states: Secondary | ICD-10-CM

## 2021-04-29 DIAGNOSIS — Z954 Presence of other heart-valve replacement: Secondary | ICD-10-CM

## 2021-04-29 DIAGNOSIS — I480 Paroxysmal atrial fibrillation: Secondary | ICD-10-CM

## 2021-04-29 DIAGNOSIS — Z5181 Encounter for therapeutic drug level monitoring: Secondary | ICD-10-CM | POA: Diagnosis not present

## 2021-04-29 DIAGNOSIS — I099 Rheumatic heart disease, unspecified: Secondary | ICD-10-CM

## 2021-04-29 DIAGNOSIS — E7800 Pure hypercholesterolemia, unspecified: Secondary | ICD-10-CM | POA: Diagnosis not present

## 2021-04-29 DIAGNOSIS — Z7901 Long term (current) use of anticoagulants: Secondary | ICD-10-CM | POA: Diagnosis not present

## 2021-04-29 DIAGNOSIS — Z8679 Personal history of other diseases of the circulatory system: Secondary | ICD-10-CM

## 2021-04-29 DIAGNOSIS — I5032 Chronic diastolic (congestive) heart failure: Secondary | ICD-10-CM | POA: Diagnosis not present

## 2021-04-29 LAB — POCT INR: INR: 3.2 — AB (ref 2.0–3.0)

## 2021-04-29 NOTE — Patient Instructions (Signed)
Continue taking 2 tablets daily except 1 tablet on Wednesday  Recheck in 6 weeks.   Call  office with any medication changes or additions [phone].

## 2021-04-29 NOTE — Progress Notes (Signed)
Cardiology Office Note:    Date:  04/29/2021   ID:  Melody Torres, DOB 11/21/1955, MRN [id number]  PCP:  Street, Stephanie Coup, MD  Cardiologist:  Gypsy Balsam, MD    Referring MD: Street, Stephanie Coup, *   Chief Complaint  Patient presents with   Follow-up  Doing well  History of Present Illness:    Melody Torres is a 65 y.o. female with past medical history significant for rheumatic fever, severe mitral stenosis, status post mitral valve replacement with minimal approach with [facility] Jude prosthesis, also status post maze procedure done at the same session.  She also got clipping of left atrial appendage. Is coming today to my office for follow-up.  Overall she is doing very well.  She denies having any chest pain, tightness palpitation dizziness overall she is recovered nicely from surgery and doing overall very well.  Past Medical History:  Diagnosis Date   Anticoagulation adequate with anticoagulant therapy 01/20/2016   Anxiety and depression    CHF (congestive heart failure) (HCC) 2017   Chronic anticoagulation    Compression fracture of thoracic spine, non-traumatic (HCC) 09/08/2019   Encounter for screening colonoscopy    Encounter for therapeutic drug monitoring 04/30/2017   GERD (gastroesophageal reflux disease)    Hypercholesterolemia 12/05/2018   Hyperlipidemia    Hyperlipidemia    no meds, diet controlled   IBS (irritable bowel syndrome)    Long term (current) use of anticoagulants [Z79.01] 04/30/2017   Mitral valve disease, rheumatic 12/05/2018   Formatting of this note might be different from the original. TTE 12/19: Left ventricle: The cavity size was normal. There was mild   concentric hypertrophy. Systolic function was normal. The   estimated ejection fraction was in the range of 55% to 60%. Wall   motion was normal; there were no regional wall motion   abnormalities. - Aortic valve: Valve area (VTI): 1.67 cm^2. Valve area (Vmax):   1.   Obesity with body  mass index 30 or greater 11/14/2019   Osteoporosis    Pain in thoracic spine 08/22/2019   Paroxysmal atrial fibrillation (HCC) 01/20/2016   Overview:  Chads score 2 anticoagulated with warfarin   Positive colorectal cancer screening using Cologuard test    Rheumatic heart disease    Rheumatic mitral regurgitation 01/20/2016   Overview:  Mild to moderate   S/P Minimally invasive maze operation for atrial fibrillation 05/24/2019   Complete bilateral atrial lesion set using cryothermy and bipolar radiofrequency ablation with clipping of LA appendage via right mini-thoracotomy approach   S/P minimally invasive mitral valve replacement with mechanical valve 05/24/2019   33 mm Sorin Carbomedics bileaflet mechanical valve via right mini thoracotomy approach   Severe mitral valve stenosis 01/20/2016   Tricuspid regurgitation     Past Surgical History:  Procedure Laterality Date   CLIPPING OF ATRIAL APPENDAGE N/A 05/24/2019   Procedure: Clipping Of Atrial Appendage Using AtriCure PRO2 clip size 35mm;  Surgeon: Purcell Nails, MD;  Location: [facility] OR;  Service: Open Heart Surgery;  Laterality: N/A;   COLONOSCOPY  06/14/2008   Mild sigmoid diverticulosis. Otherwise normal colonoscopy   COLONOSCOPY WITH PROPOFOL N/A 06/17/2020   Procedure: COLONOSCOPY WITH PROPOFOL;  Surgeon: Lynann Bologna, MD;  Location: [facility] ENDOSCOPY;  Service: Endoscopy;  Laterality: N/A;   ESOPHAGOGASTRODUODENOSCOPY  05/18/2011   Schatzki ring status post esophageal dilatation. Small hiatal hernia. Incidential gastic polyps (status post polypectomy x2). Duodenal ulcers.    MINIMALLY INVASIVE MAZE PROCEDURE N/A 05/24/2019  Procedure: MINIMALLY INVASIVE MAZE PROCEDURE;  Surgeon: Purcell Nails, MD;  Location: [facility] OR;  Service: Open Heart Surgery;  Laterality: N/A;   MITRAL VALVE REPLACEMENT Right 05/24/2019   Procedure: MINIMALLY INVASIVE MITRAL VALVE (MV) REPLACEMENT Using Carbomedics Optiform Heart Valve Size 9mm;  Surgeon: Purcell Nails, MD;  Location: [facility] OR;  Service: Open Heart Surgery;  Laterality: Right;   RIGHT/LEFT HEART CATH AND CORONARY ANGIOGRAPHY N/A 04/07/2019   Procedure: RIGHT/LEFT HEART CATH AND CORONARY ANGIOGRAPHY;  Surgeon: Marykay Lex, MD;  Location: [facility] INVASIVE CV LAB;  Service: Cardiovascular;  Laterality: N/A;   TEE WITHOUT CARDIOVERSION N/A 01/06/2019   Procedure: TRANSESOPHAGEAL ECHOCARDIOGRAM (TEE);  Surgeon: Lars Masson, MD;  Location: [facility] ENDOSCOPY;  Service: Cardiovascular;  Laterality: N/A;   TEE WITHOUT CARDIOVERSION N/A 05/24/2019   Procedure: TRANSESOPHAGEAL ECHOCARDIOGRAM (TEE);  Surgeon: Purcell Nails, MD;  Location: [facility] OR;  Service: Open Heart Surgery;  Laterality: N/A;   WISDOM TOOTH EXTRACTION      Current Medications: Current Meds  Medication Sig   acetaminophen (TYLENOL) 500 MG tablet Take 500 mg by mouth as needed for moderate pain or headache.    aspirin EC 81 MG EC tablet Take 1 tablet (81 mg total) by mouth daily.   clobetasol cream (TEMOVATE) 0.05 % Apply 1 application topically 2 (two) times daily as needed (for lichen sclerosus).    loratadine (CLARITIN) 10 MG tablet Take 10 mg by mouth every other day.    metroNIDAZOLE (METROGEL) 0.75 % gel Apply 1 application topically at bedtime.   Multiple Vitamin (MULTIVITAMIN) tablet Take 1 tablet by mouth daily. Unknown strenght   omeprazole (PRILOSEC) 20 MG capsule Take 20 mg by mouth daily as needed (for acid reflux or heartburn).    UNABLE TO FIND Take 600 mg by mouth daily. Med Name: Chromate Pure Vitamin   warfarin (COUMADIN) 5 MG tablet TAKE 2-3 TABLETS BY MOUTH DAILY AS DIRECTED BY COUMADIN CLINIC (Patient taking differently: Take 5 mg by mouth as directed. TAKE 2-3 TABLETS BY MOUTH DAILY AS DIRECTED by coumadin clinic)     Allergies:   Adhesive [tape] and Penicillins   Social History   Socioeconomic History   Marital status: Widowed    Spouse name: Not on file   Number of children: Not on file   Years  of education: Not on file   Highest education level: Not on file  Occupational History   Not on file  Tobacco Use   Smoking status: Never   Smokeless tobacco: Never  Vaping Use   Vaping Use: Never used  Substance and Sexual Activity   Alcohol use: No   Drug use: No   Sexual activity: Not on file  Other Topics Concern   Not on file  Social History Narrative   Not on file   Social Determinants of Health   Financial Resource Strain: Not on file  Food Insecurity: Not on file  Transportation Needs: Not on file  Physical Activity: Not on file  Stress: Not on file  Social Connections: Not on file     Family History: The patient's family history includes Bladder Cancer in her mother; Diabetes in her father; Heart attack in her mother; Heart disease in her father; Hypothyroidism in her mother; Sjogren's syndrome in her daughter; Stroke in her father. There is no history of Colon cancer, Esophageal cancer, Stomach cancer, or Rectal cancer. ROS:   Please see the history of present illness.    All 14 point review  of systems negative except as described per history of present illness  EKGs/Labs/Other Studies Reviewed:      Recent Labs: 06/06/2020: ALT 32; BUN 19; Creatinine, Ser 0.83; Hemoglobin 14.2; Platelets 183.0; Potassium 4.1; Sodium 142  Recent Lipid Panel    Component Value Date/Time   CHOL 203 (H) 03/08/2020 0856   TRIG 168 (H) 03/08/2020 0856   HDL 46 03/08/2020 0856   CHOLHDL 4.4 03/08/2020 0856   LDLCALC 127 (H) 03/08/2020 0856    Physical Exam:    VS:  BP 108/62 (BP Location: Right Arm, Patient Position: Sitting)   Pulse 90   Ht 5' 1.5" (1.562 m)   Wt 167 lb (75.8 kg)   LMP  (LMP Unknown)   SpO2 97%   BMI 31.04 kg/m     Wt Readings from Last 3 Encounters:  04/29/21 167 lb (75.8 kg)  10/08/20 163 lb (73.9 kg)  08/28/20 163 lb (73.9 kg)     GEN:  Well nourished, well developed in no acute distress HEENT: Normal NECK: No JVD; No carotid  bruits LYMPHATICS: No lymphadenopathy CARDIAC: RRR, no murmurs, crisp mechanical valve sounds no rubs, no gallops RESPIRATORY:  Clear to auscultation without rales, wheezing or rhonchi  ABDOMEN: Soft, non-tender, non-distended MUSCULOSKELETAL:  No edema; No deformity  SKIN: Warm and dry LOWER EXTREMITIES: no swelling NEUROLOGIC:  Alert and oriented x 3 PSYCHIATRIC:  Normal affect   ASSESSMENT:    1. Paroxysmal atrial fibrillation (HCC)   2. S/P minimally invasive mitral valve replacement with mechanical valve    3. Chronic diastolic congestive heart failure (HCC)   4. Hypercholesterolemia   5. S/P Minimally invasive maze operation for atrial fibrillation    PLAN:    In order of problems listed above:  Paroxysmal atrial fibrillation denies have any palpitations, she is anticoagulant because of mechanical valve which we will continue Status post mitral valve replacement with [facility] Jude prosthesis doing well from that point review asymptomatic.  Happy with the care she got at [facility] Chronic diastolic congestive heart failure, compensated stable continue present management. Dyslipidemia: I have HDL at 38 from March of this year however I do not see any latest LDL.  We will contact primary care physician to get a copy of it Overall think she is doing very well.  We will continue present management.   Medication Adjustments/Labs and Tests Ordered: Current medicines are reviewed at length with the patient today.  Concerns regarding medicines are outlined above.  Orders Placed This Encounter  Procedures   EKG 12-Lead   Medication changes: No orders of the defined types were placed in this encounter.   Signed, Georgeanna Lea, MD, [facility] 04/29/2021 10:43 AM    [facility]

## 2021-04-29 NOTE — Patient Instructions (Signed)

## 2021-05-01 ENCOUNTER — Ambulatory Visit: Payer: BC Managed Care – PPO | Admitting: Cardiology

## 2021-06-17 ENCOUNTER — Ambulatory Visit (INDEPENDENT_AMBULATORY_CARE_PROVIDER_SITE_OTHER): Payer: Medicare Other

## 2021-06-17 ENCOUNTER — Other Ambulatory Visit: Payer: Self-pay

## 2021-06-17 DIAGNOSIS — Z5181 Encounter for therapeutic drug level monitoring: Secondary | ICD-10-CM | POA: Diagnosis not present

## 2021-06-17 DIAGNOSIS — I099 Rheumatic heart disease, unspecified: Secondary | ICD-10-CM | POA: Diagnosis not present

## 2021-06-17 DIAGNOSIS — I480 Paroxysmal atrial fibrillation: Secondary | ICD-10-CM

## 2021-06-17 DIAGNOSIS — Z7901 Long term (current) use of anticoagulants: Secondary | ICD-10-CM | POA: Diagnosis not present

## 2021-06-17 LAB — POCT INR: INR: 3.5 — AB (ref 2.0–3.0)

## 2021-06-17 NOTE — Patient Instructions (Signed)
Description   Continue taking 2 tablets daily except 1 tablet on Wednesday  Recheck in 6 weeks. Call  office with any medication changes or additions [phone].

## 2021-07-04 ENCOUNTER — Other Ambulatory Visit: Payer: Self-pay | Admitting: Cardiology

## 2021-08-05 ENCOUNTER — Other Ambulatory Visit: Payer: Self-pay

## 2021-08-05 ENCOUNTER — Ambulatory Visit (INDEPENDENT_AMBULATORY_CARE_PROVIDER_SITE_OTHER): Payer: Medicare Other

## 2021-08-05 DIAGNOSIS — Z5181 Encounter for therapeutic drug level monitoring: Secondary | ICD-10-CM | POA: Diagnosis not present

## 2021-08-05 DIAGNOSIS — I099 Rheumatic heart disease, unspecified: Secondary | ICD-10-CM | POA: Diagnosis not present

## 2021-08-05 DIAGNOSIS — Z7901 Long term (current) use of anticoagulants: Secondary | ICD-10-CM

## 2021-08-05 DIAGNOSIS — I480 Paroxysmal atrial fibrillation: Secondary | ICD-10-CM | POA: Diagnosis not present

## 2021-08-05 LAB — POCT INR: INR: 1.6 — AB (ref 2.0–3.0)

## 2021-08-05 NOTE — Patient Instructions (Signed)
Description   Take 2.5 tablets today and 1.5 tablets tomorrow and then continue taking 2 tablets daily except 1 tablet on Wednesday  Recheck in 1 week. Call office with any medication changes or additions [phone].

## 2021-08-12 ENCOUNTER — Ambulatory Visit (INDEPENDENT_AMBULATORY_CARE_PROVIDER_SITE_OTHER): Payer: Medicare Other

## 2021-08-12 ENCOUNTER — Other Ambulatory Visit: Payer: Self-pay

## 2021-08-12 DIAGNOSIS — Z5181 Encounter for therapeutic drug level monitoring: Secondary | ICD-10-CM | POA: Diagnosis not present

## 2021-08-12 DIAGNOSIS — I480 Paroxysmal atrial fibrillation: Secondary | ICD-10-CM | POA: Diagnosis not present

## 2021-08-12 DIAGNOSIS — Z7901 Long term (current) use of anticoagulants: Secondary | ICD-10-CM

## 2021-08-12 DIAGNOSIS — I099 Rheumatic heart disease, unspecified: Secondary | ICD-10-CM

## 2021-08-12 LAB — POCT INR: INR: 1.8 — AB (ref 2.0–3.0)

## 2021-08-12 NOTE — Patient Instructions (Signed)
Description   Take 2.5 tablets today and  then START taking 2 tablets daily. Recheck in 1 week. Call office with any medication changes or additions [phone].

## 2021-08-19 ENCOUNTER — Other Ambulatory Visit: Payer: Self-pay

## 2021-08-19 ENCOUNTER — Ambulatory Visit (INDEPENDENT_AMBULATORY_CARE_PROVIDER_SITE_OTHER): Payer: Medicare Other

## 2021-08-19 DIAGNOSIS — I099 Rheumatic heart disease, unspecified: Secondary | ICD-10-CM

## 2021-08-19 DIAGNOSIS — Z5181 Encounter for therapeutic drug level monitoring: Secondary | ICD-10-CM | POA: Diagnosis not present

## 2021-08-19 DIAGNOSIS — Z7901 Long term (current) use of anticoagulants: Secondary | ICD-10-CM

## 2021-08-19 DIAGNOSIS — I480 Paroxysmal atrial fibrillation: Secondary | ICD-10-CM

## 2021-08-19 LAB — POCT INR: INR: 1.7 — AB (ref 2.0–3.0)

## 2021-08-19 NOTE — Patient Instructions (Signed)
Description   Take 3 tablets today and  then START taking 2 tablets daily except 2.5 tablets on Sundays and Thursdays. Recheck in 1 week. Call office with any medication changes or additions [phone].

## 2021-08-26 ENCOUNTER — Other Ambulatory Visit: Payer: Self-pay

## 2021-08-26 ENCOUNTER — Ambulatory Visit (INDEPENDENT_AMBULATORY_CARE_PROVIDER_SITE_OTHER): Payer: Medicare Other

## 2021-08-26 DIAGNOSIS — Z5181 Encounter for therapeutic drug level monitoring: Secondary | ICD-10-CM

## 2021-08-26 DIAGNOSIS — I099 Rheumatic heart disease, unspecified: Secondary | ICD-10-CM

## 2021-08-26 DIAGNOSIS — Z7901 Long term (current) use of anticoagulants: Secondary | ICD-10-CM

## 2021-08-26 DIAGNOSIS — I480 Paroxysmal atrial fibrillation: Secondary | ICD-10-CM

## 2021-08-26 LAB — POCT INR: INR: 3 (ref 2.0–3.0)

## 2021-08-26 NOTE — Patient Instructions (Signed)
Description   Continue taking 2 tablets daily except 2.5 tablets on Sundays and Thursdays. Recheck in 2 weeks. Call office with any medication changes or additions [phone].

## 2021-09-09 ENCOUNTER — Other Ambulatory Visit: Payer: Self-pay

## 2021-09-09 ENCOUNTER — Ambulatory Visit (INDEPENDENT_AMBULATORY_CARE_PROVIDER_SITE_OTHER): Payer: Medicare Other

## 2021-09-09 DIAGNOSIS — Z7901 Long term (current) use of anticoagulants: Secondary | ICD-10-CM | POA: Diagnosis not present

## 2021-09-09 DIAGNOSIS — Z5181 Encounter for therapeutic drug level monitoring: Secondary | ICD-10-CM | POA: Diagnosis not present

## 2021-09-09 DIAGNOSIS — I480 Paroxysmal atrial fibrillation: Secondary | ICD-10-CM | POA: Diagnosis not present

## 2021-09-09 DIAGNOSIS — I099 Rheumatic heart disease, unspecified: Secondary | ICD-10-CM

## 2021-09-09 LAB — POCT INR: INR: 2.4 (ref 2.0–3.0)

## 2021-09-09 NOTE — Patient Instructions (Signed)
Description   Take 2.5 tablets today and continue taking 2 tablets daily except 2.5 tablets on Sundays and Thursdays. Recheck in 3 weeks. Call office with any medication changes or additions [phone].

## 2021-09-20 DIAGNOSIS — L900 Lichen sclerosus et atrophicus: Secondary | ICD-10-CM | POA: Insufficient documentation

## 2021-09-30 ENCOUNTER — Ambulatory Visit (INDEPENDENT_AMBULATORY_CARE_PROVIDER_SITE_OTHER): Payer: Medicare Other

## 2021-09-30 ENCOUNTER — Other Ambulatory Visit: Payer: Self-pay

## 2021-09-30 DIAGNOSIS — I099 Rheumatic heart disease, unspecified: Secondary | ICD-10-CM

## 2021-09-30 DIAGNOSIS — Z7901 Long term (current) use of anticoagulants: Secondary | ICD-10-CM

## 2021-09-30 DIAGNOSIS — Z5181 Encounter for therapeutic drug level monitoring: Secondary | ICD-10-CM

## 2021-09-30 DIAGNOSIS — I480 Paroxysmal atrial fibrillation: Secondary | ICD-10-CM

## 2021-09-30 LAB — POCT INR: INR: 3.4 — AB (ref 2.0–3.0)

## 2021-09-30 NOTE — Patient Instructions (Signed)
Description   Continue taking 2 tablets daily except 2.5 tablets on Sundays and Thursdays. Recheck in 4 weeks. Call office with any medication changes or additions [phone].

## 2021-10-28 ENCOUNTER — Other Ambulatory Visit: Payer: Self-pay

## 2021-10-28 ENCOUNTER — Ambulatory Visit (INDEPENDENT_AMBULATORY_CARE_PROVIDER_SITE_OTHER): Payer: Medicare Other

## 2021-10-28 DIAGNOSIS — Z7901 Long term (current) use of anticoagulants: Secondary | ICD-10-CM | POA: Diagnosis not present

## 2021-10-28 DIAGNOSIS — I480 Paroxysmal atrial fibrillation: Secondary | ICD-10-CM | POA: Diagnosis not present

## 2021-10-28 DIAGNOSIS — Z5181 Encounter for therapeutic drug level monitoring: Secondary | ICD-10-CM | POA: Diagnosis not present

## 2021-10-28 DIAGNOSIS — I099 Rheumatic heart disease, unspecified: Secondary | ICD-10-CM | POA: Diagnosis not present

## 2021-10-28 LAB — POCT INR: INR: 3.3 — AB (ref 2.0–3.0)

## 2021-10-28 NOTE — Patient Instructions (Signed)
Description   °Continue taking 2 tablets daily except 2.5 tablets on Sundays and Thursdays. Recheck in 6 weeks. Call office with any medication changes or additions [phone].  ° °  °   °

## 2021-11-19 ENCOUNTER — Other Ambulatory Visit: Payer: Self-pay

## 2021-11-20 ENCOUNTER — Other Ambulatory Visit: Payer: Self-pay

## 2021-11-20 ENCOUNTER — Ambulatory Visit (INDEPENDENT_AMBULATORY_CARE_PROVIDER_SITE_OTHER): Payer: Medicare Other | Admitting: Cardiology

## 2021-11-20 ENCOUNTER — Encounter: Payer: Self-pay | Admitting: Cardiology

## 2021-11-20 VITALS — BP 112/72 | HR 79 | Ht 61.0 in | Wt 157.2 lb

## 2021-11-20 DIAGNOSIS — Z7901 Long term (current) use of anticoagulants: Secondary | ICD-10-CM | POA: Diagnosis not present

## 2021-11-20 DIAGNOSIS — I099 Rheumatic heart disease, unspecified: Secondary | ICD-10-CM

## 2021-11-20 DIAGNOSIS — I480 Paroxysmal atrial fibrillation: Secondary | ICD-10-CM

## 2021-11-20 DIAGNOSIS — Z8679 Personal history of other diseases of the circulatory system: Secondary | ICD-10-CM

## 2021-11-20 DIAGNOSIS — E782 Mixed hyperlipidemia: Secondary | ICD-10-CM

## 2021-11-20 DIAGNOSIS — Z9889 Other specified postprocedural states: Secondary | ICD-10-CM | POA: Diagnosis not present

## 2021-11-20 DIAGNOSIS — Z954 Presence of other heart-valve replacement: Secondary | ICD-10-CM | POA: Diagnosis not present

## 2021-11-20 NOTE — Progress Notes (Signed)
Cardiology Office Note:    Date:  11/20/2021   ID:  Melody Torres, DOB October 21, 1956, MRN [id number]  PCP:  Street, Stephanie Coup, MD  Cardiologist:  Gypsy Balsam, MD    Referring MD: Street, Stephanie Coup, *   Chief Complaint  Patient presents with   Follow-up  I am doing well  History of Present Illness:    Melody Torres is a 66 y.o. female patient 66 year old lady with past medical history significant for rheumatic fever, which resulted in severe mitral stenosis on 05/24/2019 she did have minimally invasive mitral valve replacement with mechanical valve 33 mm Sorin carbomedics done via minimal approach thoracotomy.  At the same time she did have Maze procedure with left atrial appendage clipping.  Her past medical history is also significant for hypercholesterolemia, long-term anticoagulation, paroxysmal atrial fibrillation. She comes to my office for follow-up.  Overall she is doing very well.  Very happy with the results of her procedure still keep dancing 3 times a week with no difficulties.  There is no shortness of breath no palpitation dizziness swelling of lower extremities.  Overall doing well  Past Medical History:  Diagnosis Date   Anticoagulation adequate with anticoagulant therapy 01/20/2016   Anxiety and depression    CHF (congestive heart failure) (HCC) 2017   Chronic anticoagulation    Compression fracture of thoracic spine, non-traumatic (HCC) 09/08/2019   Encounter for screening colonoscopy    Encounter for therapeutic drug monitoring 04/30/2017   GERD (gastroesophageal reflux disease)    Hypercholesterolemia 12/05/2018   Hyperlipidemia    Hyperlipidemia    no meds, diet controlled   IBS (irritable bowel syndrome)    Long term (current) use of anticoagulants [Z79.01] 04/30/2017   Mitral valve disease, rheumatic 12/05/2018   Formatting of this note might be different from the original. TTE 12/19: Left ventricle: The cavity size was normal. There was mild    concentric hypertrophy. Systolic function was normal. The   estimated ejection fraction was in the range of 55% to 60%. Wall   motion was normal; there were no regional wall motion   abnormalities. - Aortic valve: Valve area (VTI): 1.67 cm^2. Valve area (Vmax):   1.   Obesity with body mass index 30 or greater 11/14/2019   Osteoporosis    Pain in thoracic spine 08/22/2019   Paroxysmal atrial fibrillation (HCC) 01/20/2016   Overview:  Chads score 2 anticoagulated with warfarin   Positive colorectal cancer screening using Cologuard test    Rheumatic heart disease    Rheumatic mitral regurgitation 01/20/2016   Overview:  Mild to moderate   S/P Minimally invasive maze operation for atrial fibrillation 05/24/2019   Complete bilateral atrial lesion set using cryothermy and bipolar radiofrequency ablation with clipping of LA appendage via right mini-thoracotomy approach   S/P minimally invasive mitral valve replacement with mechanical valve 05/24/2019   33 mm Sorin Carbomedics bileaflet mechanical valve via right mini thoracotomy approach   Severe mitral valve stenosis 01/20/2016   Tricuspid regurgitation     Past Surgical History:  Procedure Laterality Date   CLIPPING OF ATRIAL APPENDAGE N/A 05/24/2019   Procedure: Clipping Of Atrial Appendage Using AtriCure PRO2 clip size 12mm;  Surgeon: Purcell Nails, MD;  Location: [facility] OR;  Service: Open Heart Surgery;  Laterality: N/A;   COLONOSCOPY  06/14/2008   Mild sigmoid diverticulosis. Otherwise normal colonoscopy   COLONOSCOPY WITH PROPOFOL N/A 06/17/2020   Procedure: COLONOSCOPY WITH PROPOFOL;  Surgeon: Lynann Bologna, MD;  Location: [facility]  ENDOSCOPY;  Service: Endoscopy;  Laterality: N/A;   ESOPHAGOGASTRODUODENOSCOPY  05/18/2011   Schatzki ring status post esophageal dilatation. Small hiatal hernia. Incidential gastic polyps (status post polypectomy x2). Duodenal ulcers.    MINIMALLY INVASIVE MAZE PROCEDURE N/A 05/24/2019   Procedure: MINIMALLY INVASIVE MAZE  PROCEDURE;  Surgeon: Purcell Nails, MD;  Location: [facility] OR;  Service: Open Heart Surgery;  Laterality: N/A;   MITRAL VALVE REPLACEMENT Right 05/24/2019   Procedure: MINIMALLY INVASIVE MITRAL VALVE (MV) REPLACEMENT Using Carbomedics Optiform Heart Valve Size 65mm;  Surgeon: Purcell Nails, MD;  Location: [facility] OR;  Service: Open Heart Surgery;  Laterality: Right;   RIGHT/LEFT HEART CATH AND CORONARY ANGIOGRAPHY N/A 04/07/2019   Procedure: RIGHT/LEFT HEART CATH AND CORONARY ANGIOGRAPHY;  Surgeon: Marykay Lex, MD;  Location: [facility] INVASIVE CV LAB;  Service: Cardiovascular;  Laterality: N/A;   TEE WITHOUT CARDIOVERSION N/A 01/06/2019   Procedure: TRANSESOPHAGEAL ECHOCARDIOGRAM (TEE);  Surgeon: Lars Masson, MD;  Location: [facility] ENDOSCOPY;  Service: Cardiovascular;  Laterality: N/A;   TEE WITHOUT CARDIOVERSION N/A 05/24/2019   Procedure: TRANSESOPHAGEAL ECHOCARDIOGRAM (TEE);  Surgeon: Purcell Nails, MD;  Location: [facility] OR;  Service: Open Heart Surgery;  Laterality: N/A;   WISDOM TOOTH EXTRACTION      Current Medications: Current Meds  Medication Sig   acetaminophen (TYLENOL) 500 MG tablet Take 500 mg by mouth as needed for moderate pain or headache.    aspirin EC 81 MG EC tablet Take 1 tablet (81 mg total) by mouth daily.   clobetasol cream (TEMOVATE) 0.05 % Apply 1 application topically 2 (two) times daily as needed (for lichen sclerosus).    loratadine (CLARITIN) 10 MG tablet Take 10 mg by mouth every other day.    Multiple Vitamin (MULTIVITAMIN) tablet Take 1 tablet by mouth daily. Unknown strenght   omeprazole (PRILOSEC) 20 MG capsule Take 20 mg by mouth daily as needed (for acid reflux or heartburn).    UNABLE TO FIND Take 600 mg by mouth daily. Med Name: Chromate Pure Vitamin/ Unknown strength   warfarin (COUMADIN) 5 MG tablet TAKE 1 to 2 TABLETS BY MOUTH DAILY AS DIRECTED by coumadin clinic (Patient taking differently: Take 5-10 mg by mouth one time only at 4 PM. TAKE 1 to 2 TABLETS BY  MOUTH DAILY AS DIRECTED by coumadin clinic)     Allergies:   Adhesive [tape] and Penicillins   Social History   Socioeconomic History   Marital status: Widowed    Spouse name: Not on file   Number of children: Not on file   Years of education: Not on file   Highest education level: Not on file  Occupational History   Not on file  Tobacco Use   Smoking status: Never   Smokeless tobacco: Never  Vaping Use   Vaping Use: Never used  Substance and Sexual Activity   Alcohol use: No   Drug use: No   Sexual activity: Not on file  Other Topics Concern   Not on file  Social History Narrative   Not on file   Social Determinants of Health   Financial Resource Strain: Not on file  Food Insecurity: Not on file  Transportation Needs: Not on file  Physical Activity: Not on file  Stress: Not on file  Social Connections: Not on file     Family History: The patient's family history includes Bladder Cancer in her mother; Diabetes in her father; Heart attack in her mother; Heart disease in her father; Hypothyroidism in her mother;  Sjogren's syndrome in her daughter; Stroke in her father. There is no history of Colon cancer, Esophageal cancer, Stomach cancer, or Rectal cancer. ROS:   Please see the history of present illness.    All 14 point review of systems negative except as described per history of present illness  EKGs/Labs/Other Studies Reviewed:      Recent Labs: No results found for requested labs within last 8760 hours.  Recent Lipid Panel    Component Value Date/Time   CHOL 203 (H) 03/08/2020 0856   TRIG 168 (H) 03/08/2020 0856   HDL 46 03/08/2020 0856   CHOLHDL 4.4 03/08/2020 0856   LDLCALC 127 (H) 03/08/2020 0856    Physical Exam:    VS:  BP 112/72 (BP Location: Left Arm, Patient Position: Sitting)    Pulse 79    Ht 5\' 1"  (1.549 m)    Wt 157 lb 3.2 oz (71.3 kg)    LMP  (LMP Unknown)    SpO2 97%    BMI 29.70 kg/m     Wt Readings from Last 3 Encounters:   11/20/21 157 lb 3.2 oz (71.3 kg)  04/29/21 167 lb (75.8 kg)  10/08/20 163 lb (73.9 kg)     GEN:  Well nourished, well developed in no acute distress HEENT: Normal NECK: No JVD; No carotid bruits LYMPHATICS: No lymphadenopathy CARDIAC: RRR, crisp mechanical valve sounds, there is systolic murmur grade 1/6 to 2/6 best heard right upper portion of sternum and left-sided border, no rubs, no gallops RESPIRATORY:  Clear to auscultation without rales, wheezing or rhonchi  ABDOMEN: Soft, non-tender, non-distended MUSCULOSKELETAL:  No edema; No deformity  SKIN: Warm and dry LOWER EXTREMITIES: no swelling NEUROLOGIC:  Alert and oriented x 3 PSYCHIATRIC:  Normal affect   ASSESSMENT:    1. S/P Minimally invasive maze operation for atrial fibrillation   2. Mixed hyperlipidemia   3. Anticoagulation adequate with anticoagulant therapy   4. S/P minimally invasive mitral valve replacement with mechanical valve    5. Rheumatic heart disease   6. Paroxysmal atrial fibrillation (HCC)    PLAN:    In order of problems listed above:  Status post mitral valve replacement.  She will be scheduled to have echocardiogram to look at the valves another reason for that is the fact that her ejection fraction was 40 to 55% which is low limits of normal in 2020, therefore I will ask her to have echocardiogram done. Anticoagulation.  She is being on Coumadin need to be she did have mitral valve stenosis which is rheumatic.  Now she does have a mechanical valve.  We will continue anticoagulation she understand that.  We also talked about need to take antibiotics before any dental work Because mechanical valve. Rheumatic fever doing well from that point review no new problems. Paroxysmal atrial fibrillation: Maintaining sinus rhythm.  Continue anticoagulation no antiplatelet. Dyslipidemia I will ask her to have fasting profile done When she comes for INR check.   Medication Adjustments/Labs and Tests  Ordered: Current medicines are reviewed at length with the patient today.  Concerns regarding medicines are outlined above.  No orders of the defined types were placed in this encounter.  Medication changes: No orders of the defined types were placed in this encounter.   Signed, Georgeanna Leaobert J. Melanie Openshaw, MD, [facility] 11/20/2021 1:07 PM    [facility]

## 2021-11-20 NOTE — Patient Instructions (Signed)
Medication Instructions:  Your physician recommends that you continue on your current medications as directed. Please refer to the Current Medication list given to you today.  *If you need a refill on your cardiac medications before your next appointment, please call your pharmacy*   Lab Work: Your physician recommends that you return for lab work in: Labs: Lipids If you have labs (blood work) drawn today and your tests are completely normal, you will receive your results only by: MyChart Message (if you have MyChart) OR A paper copy in the mail If you have any lab test that is abnormal or we need to change your treatment, we will call you to review the results.   Testing/Procedures: Your physician has requested that you have an echocardiogram. Echocardiography is a painless test that uses sound waves to create images of your heart. It provides your doctor with information about the size and shape of your heart and how well your hearts chambers and valves are working. This procedure takes approximately one hour. There are no restrictions for this procedure.    Follow-Up: At [facility], you and your health needs are our priority.  As part of our continuing mission to provide you with exceptional heart care, we have created designated Provider Care Teams.  These Care Teams include your primary Cardiologist (physician) and Advanced Practice Providers (APPs -  Physician Assistants and Nurse Practitioners) who all work together to provide you with the care you need, when you need it.  We recommend signing up for the patient portal called "MyChart".  Sign up information is provided on this After Visit Summary.  MyChart is used to connect with patients for Virtual Visits (Telemedicine).  Patients are able to view lab/test results, encounter notes, upcoming appointments, etc.  Non-urgent messages can be sent to your provider as well.   To learn more about what you can do with MyChart, go to  [email or web address].    Your next appointment:   6 month(s)  The format for your next appointment:   In Person  Provider:   Gypsy Balsam, MD    Other Instructions None

## 2021-11-24 ENCOUNTER — Other Ambulatory Visit: Payer: Self-pay

## 2021-11-24 ENCOUNTER — Ambulatory Visit (INDEPENDENT_AMBULATORY_CARE_PROVIDER_SITE_OTHER): Payer: Medicare Other

## 2021-11-24 DIAGNOSIS — E782 Mixed hyperlipidemia: Secondary | ICD-10-CM

## 2021-11-24 DIAGNOSIS — I480 Paroxysmal atrial fibrillation: Secondary | ICD-10-CM

## 2021-11-24 DIAGNOSIS — Z8679 Personal history of other diseases of the circulatory system: Secondary | ICD-10-CM

## 2021-11-24 DIAGNOSIS — Z954 Presence of other heart-valve replacement: Secondary | ICD-10-CM

## 2021-11-24 DIAGNOSIS — Z7901 Long term (current) use of anticoagulants: Secondary | ICD-10-CM | POA: Diagnosis not present

## 2021-11-24 DIAGNOSIS — I099 Rheumatic heart disease, unspecified: Secondary | ICD-10-CM

## 2021-11-24 DIAGNOSIS — Z9889 Other specified postprocedural states: Secondary | ICD-10-CM

## 2021-11-24 LAB — ECHOCARDIOGRAM COMPLETE
Area-P 1/2: 4.06 cm2
MV VTI: 2.06 cm2
S' Lateral: 3 cm

## 2021-11-26 ENCOUNTER — Telehealth: Payer: Self-pay

## 2021-11-26 NOTE — Telephone Encounter (Signed)
Patient notified of results.

## 2021-11-26 NOTE — Telephone Encounter (Signed)
-----   Message from Georgeanna Lea, MD sent at 11/26/2021  1:25 PM EST ----- Echocardiogram looks good with good function of the valve.

## 2021-12-09 ENCOUNTER — Ambulatory Visit (INDEPENDENT_AMBULATORY_CARE_PROVIDER_SITE_OTHER): Payer: Medicare Other

## 2021-12-09 ENCOUNTER — Other Ambulatory Visit: Payer: Self-pay

## 2021-12-09 DIAGNOSIS — I099 Rheumatic heart disease, unspecified: Secondary | ICD-10-CM

## 2021-12-09 DIAGNOSIS — Z7901 Long term (current) use of anticoagulants: Secondary | ICD-10-CM | POA: Diagnosis not present

## 2021-12-09 DIAGNOSIS — I480 Paroxysmal atrial fibrillation: Secondary | ICD-10-CM | POA: Diagnosis not present

## 2021-12-09 DIAGNOSIS — Z5181 Encounter for therapeutic drug level monitoring: Secondary | ICD-10-CM | POA: Diagnosis not present

## 2021-12-09 LAB — POCT INR: INR: 3.3 — AB (ref 2.0–3.0)

## 2021-12-09 NOTE — Patient Instructions (Signed)
Description   Continue taking 2 tablets daily except 2.5 tablets on Sundays and Thursdays. Recheck in 6 weeks. Call office with any medication changes or additions [phone].

## 2021-12-10 LAB — LIPID PANEL
Chol/HDL Ratio: 5.8 ratio — ABNORMAL HIGH (ref 0.0–4.4)
Cholesterol, Total: 244 mg/dL — ABNORMAL HIGH (ref 100–199)
HDL: 42 mg/dL (ref >39)
LDL Chol Calc (NIH): 165 mg/dL — ABNORMAL HIGH (ref 0–99)
Triglycerides: 197 mg/dL — ABNORMAL HIGH (ref 0–149)
VLDL Cholesterol Cal: 37 mg/dL (ref 5–40)

## 2021-12-16 ENCOUNTER — Telehealth: Payer: Self-pay | Admitting: Cardiology

## 2021-12-16 NOTE — Telephone Encounter (Signed)
Patient is returning call to discuss lab results. 

## 2021-12-17 NOTE — Telephone Encounter (Signed)
Patient informed of her results

## 2021-12-26 ENCOUNTER — Telehealth: Payer: Self-pay

## 2021-12-26 ENCOUNTER — Encounter: Payer: Self-pay | Admitting: Cardiology

## 2021-12-26 ENCOUNTER — Other Ambulatory Visit: Payer: Self-pay

## 2021-12-26 DIAGNOSIS — E7800 Pure hypercholesterolemia, unspecified: Secondary | ICD-10-CM

## 2021-12-26 MED ORDER — ROSUVASTATIN CALCIUM 10 MG PO TABS: 10.0000 mg | ORAL_TABLET | Freq: Every day | ORAL | 3 refills | Status: DC

## 2021-12-31 ENCOUNTER — Telehealth: Payer: Self-pay

## 2022-01-02 ENCOUNTER — Other Ambulatory Visit: Payer: Self-pay | Admitting: Cardiology

## 2022-01-12 ENCOUNTER — Encounter: Payer: Self-pay | Admitting: Cardiology

## 2022-01-12 MED ORDER — ROSUVASTATIN CALCIUM 10 MG PO TABS: 10.0000 mg | ORAL_TABLET | Freq: Every day | ORAL | 3 refills | Status: DC

## 2022-01-20 ENCOUNTER — Other Ambulatory Visit: Payer: Self-pay

## 2022-01-20 ENCOUNTER — Ambulatory Visit (INDEPENDENT_AMBULATORY_CARE_PROVIDER_SITE_OTHER): Payer: Medicare Other

## 2022-01-20 DIAGNOSIS — I099 Rheumatic heart disease, unspecified: Secondary | ICD-10-CM

## 2022-01-20 DIAGNOSIS — Z7901 Long term (current) use of anticoagulants: Secondary | ICD-10-CM

## 2022-01-20 DIAGNOSIS — Z5181 Encounter for therapeutic drug level monitoring: Secondary | ICD-10-CM | POA: Diagnosis not present

## 2022-01-20 DIAGNOSIS — I480 Paroxysmal atrial fibrillation: Secondary | ICD-10-CM

## 2022-01-20 LAB — POCT INR: INR: 3.7 — AB (ref 2.0–3.0)

## 2022-01-20 NOTE — Patient Instructions (Signed)
Description   ?Eat greens today and continue taking 2 tablets daily except 2.5 tablets on Sundays and Thursdays. Recheck in 6 weeks. Call office with any medication changes or additions [phone].  ? ?  ?   ?

## 2022-03-03 ENCOUNTER — Ambulatory Visit (INDEPENDENT_AMBULATORY_CARE_PROVIDER_SITE_OTHER): Payer: Medicare Other

## 2022-03-03 DIAGNOSIS — I099 Rheumatic heart disease, unspecified: Secondary | ICD-10-CM

## 2022-03-03 DIAGNOSIS — I480 Paroxysmal atrial fibrillation: Secondary | ICD-10-CM

## 2022-03-03 DIAGNOSIS — Z5181 Encounter for therapeutic drug level monitoring: Secondary | ICD-10-CM

## 2022-03-03 DIAGNOSIS — Z7901 Long term (current) use of anticoagulants: Secondary | ICD-10-CM

## 2022-03-03 LAB — POCT INR: INR: 4.1 — AB (ref 2.0–3.0)

## 2022-03-03 NOTE — Patient Instructions (Signed)
Description   ?Hold today's dose and START taking 2 tablets daily except 2.5 tablets on Sundays.  ?Recheck in 3 weeks.  ?Call office with any medication changes or additions [phone].  ? ?  ?   ?

## 2022-03-24 ENCOUNTER — Ambulatory Visit (INDEPENDENT_AMBULATORY_CARE_PROVIDER_SITE_OTHER): Payer: Medicare Other

## 2022-03-24 DIAGNOSIS — Z7901 Long term (current) use of anticoagulants: Secondary | ICD-10-CM | POA: Diagnosis not present

## 2022-03-24 DIAGNOSIS — I099 Rheumatic heart disease, unspecified: Secondary | ICD-10-CM

## 2022-03-24 DIAGNOSIS — Z5181 Encounter for therapeutic drug level monitoring: Secondary | ICD-10-CM

## 2022-03-24 DIAGNOSIS — I480 Paroxysmal atrial fibrillation: Secondary | ICD-10-CM | POA: Diagnosis not present

## 2022-03-24 LAB — POCT INR: INR: 2.7 (ref 2.0–3.0)

## 2022-03-24 NOTE — Patient Instructions (Addendum)
Description   Continue taking 2 tablets daily except 2.5 tablets on Sundays.  Recheck in 4 weeks.  Call office with any medication changes or additions [phone].

## 2022-04-21 ENCOUNTER — Ambulatory Visit (INDEPENDENT_AMBULATORY_CARE_PROVIDER_SITE_OTHER): Payer: Medicare Other

## 2022-04-21 ENCOUNTER — Encounter: Payer: Self-pay | Admitting: Cardiology

## 2022-04-21 ENCOUNTER — Ambulatory Visit (INDEPENDENT_AMBULATORY_CARE_PROVIDER_SITE_OTHER): Payer: Medicare Other | Admitting: Cardiology

## 2022-04-21 VITALS — BP 120/68 | HR 74 | Ht 61.0 in | Wt 165.0 lb

## 2022-04-21 DIAGNOSIS — Z8679 Personal history of other diseases of the circulatory system: Secondary | ICD-10-CM

## 2022-04-21 DIAGNOSIS — I480 Paroxysmal atrial fibrillation: Secondary | ICD-10-CM

## 2022-04-21 DIAGNOSIS — I099 Rheumatic heart disease, unspecified: Secondary | ICD-10-CM

## 2022-04-21 DIAGNOSIS — Z5181 Encounter for therapeutic drug level monitoring: Secondary | ICD-10-CM

## 2022-04-21 DIAGNOSIS — Z7901 Long term (current) use of anticoagulants: Secondary | ICD-10-CM

## 2022-04-21 DIAGNOSIS — Z954 Presence of other heart-valve replacement: Secondary | ICD-10-CM

## 2022-04-21 DIAGNOSIS — Z9889 Other specified postprocedural states: Secondary | ICD-10-CM

## 2022-04-21 LAB — POCT INR: INR: 3.5 — AB (ref 2.0–3.0)

## 2022-04-21 NOTE — Progress Notes (Signed)
Cardiology Office Note:    Date:  04/21/2022   ID:  Melody Torres, DOB 04/17/1956, MRN [id number]  PCP:  Street, Stephanie Coup, MD  Cardiologist:  Gypsy Balsam, MD    Referring MD: Street, Stephanie Coup, *   Chief Complaint  Patient presents with   Follow-up  Doing fine  History of Present Illness:    Melody Torres is a 66 y.o. female  patient 66 year old lady with past medical history significant for rheumatic fever, which resulted in severe mitral stenosis on 05/24/2019 she did have minimally invasive mitral valve replacement with mechanical valve 33 mm Sorin carbomedics done via minimal approach thoracotomy.  At the same time she did have Maze procedure with left atrial appendage clipping.  Her past medical history is also significant for hypercholesterolemia, long-term anticoagulation, paroxysmal atrial fibrillation. Comes today to my office for follow-up.  Overall doing very well.  Still dancing 3 times a week even more have no difficulty doing this complaining of having some cramps in her feet, circulation looks very good today lower extremities good pulses, also describes some numbing sensation happening the left scapular area sometimes when she is sitting.  Otherwise no problem  Past Medical History:  Diagnosis Date   Anticoagulation adequate with anticoagulant therapy 01/20/2016   Anxiety and depression    CHF (congestive heart failure) (HCC) 2017   Chronic anticoagulation    Compression fracture of thoracic spine, non-traumatic (HCC) 09/08/2019   Encounter for screening colonoscopy    Encounter for therapeutic drug monitoring 04/30/2017   GERD (gastroesophageal reflux disease)    Hypercholesterolemia 12/05/2018   Hyperlipidemia    Hyperlipidemia    no meds, diet controlled   IBS (irritable bowel syndrome)    Long term (current) use of anticoagulants [Z79.01] 04/30/2017   Mitral valve disease, rheumatic 12/05/2018   Formatting of this note might be different from  the original. TTE 12/19: Left ventricle: The cavity size was normal. There was mild   concentric hypertrophy. Systolic function was normal. The   estimated ejection fraction was in the range of 55% to 60%. Wall   motion was normal; there were no regional wall motion   abnormalities. - Aortic valve: Valve area (VTI): 1.67 cm^2. Valve area (Vmax):   1.   Obesity with body mass index 30 or greater 11/14/2019   Osteoporosis    Pain in thoracic spine 08/22/2019   Paroxysmal atrial fibrillation (HCC) 01/20/2016   Overview:  Chads score 2 anticoagulated with warfarin   Positive colorectal cancer screening using Cologuard test    Rheumatic heart disease    Rheumatic mitral regurgitation 01/20/2016   Overview:  Mild to moderate   S/P Minimally invasive maze operation for atrial fibrillation 05/24/2019   Complete bilateral atrial lesion set using cryothermy and bipolar radiofrequency ablation with clipping of LA appendage via right mini-thoracotomy approach   S/P minimally invasive mitral valve replacement with mechanical valve 05/24/2019   33 mm Sorin Carbomedics bileaflet mechanical valve via right mini thoracotomy approach   Severe mitral valve stenosis 01/20/2016   Tricuspid regurgitation     Past Surgical History:  Procedure Laterality Date   CLIPPING OF ATRIAL APPENDAGE N/A 05/24/2019   Procedure: Clipping Of Atrial Appendage Using AtriCure PRO2 clip size 79mm;  Surgeon: Purcell Nails, MD;  Location: [facility] OR;  Service: Open Heart Surgery;  Laterality: N/A;   COLONOSCOPY  06/14/2008   Mild sigmoid diverticulosis. Otherwise normal colonoscopy   COLONOSCOPY WITH PROPOFOL N/A 06/17/2020   Procedure: COLONOSCOPY  WITH PROPOFOL;  Surgeon: Lynann Bologna, MD;  Location: [facility] ENDOSCOPY;  Service: Endoscopy;  Laterality: N/A;   ESOPHAGOGASTRODUODENOSCOPY  05/18/2011   Schatzki ring status post esophageal dilatation. Small hiatal hernia. Incidential gastic polyps (status post polypectomy x2). Duodenal ulcers.     MINIMALLY INVASIVE MAZE PROCEDURE N/A 05/24/2019   Procedure: MINIMALLY INVASIVE MAZE PROCEDURE;  Surgeon: Purcell Nails, MD;  Location: [facility] OR;  Service: Open Heart Surgery;  Laterality: N/A;   MITRAL VALVE REPLACEMENT Right 05/24/2019   Procedure: MINIMALLY INVASIVE MITRAL VALVE (MV) REPLACEMENT Using Carbomedics Optiform Heart Valve Size 64mm;  Surgeon: Purcell Nails, MD;  Location: [facility] OR;  Service: Open Heart Surgery;  Laterality: Right;   RIGHT/LEFT HEART CATH AND CORONARY ANGIOGRAPHY N/A 04/07/2019   Procedure: RIGHT/LEFT HEART CATH AND CORONARY ANGIOGRAPHY;  Surgeon: Marykay Lex, MD;  Location: [facility] INVASIVE CV LAB;  Service: Cardiovascular;  Laterality: N/A;   TEE WITHOUT CARDIOVERSION N/A 01/06/2019   Procedure: TRANSESOPHAGEAL ECHOCARDIOGRAM (TEE);  Surgeon: Lars Masson, MD;  Location: [facility] ENDOSCOPY;  Service: Cardiovascular;  Laterality: N/A;   TEE WITHOUT CARDIOVERSION N/A 05/24/2019   Procedure: TRANSESOPHAGEAL ECHOCARDIOGRAM (TEE);  Surgeon: Purcell Nails, MD;  Location: [facility] OR;  Service: Open Heart Surgery;  Laterality: N/A;   WISDOM TOOTH EXTRACTION      Current Medications: Current Meds  Medication Sig   acetaminophen (TYLENOL) 500 MG tablet Take 500 mg by mouth as needed for moderate pain or headache.    aspirin EC 81 MG EC tablet Take 1 tablet (81 mg total) by mouth daily.   clobetasol cream (TEMOVATE) 0.05 % Apply 1 application topically 2 (two) times daily as needed (for lichen sclerosus).    loratadine (CLARITIN) 10 MG tablet Take 10 mg by mouth every other day.    Multiple Vitamin (MULTIVITAMIN) tablet Take 1 tablet by mouth daily. Unknown strenght   rosuvastatin (CRESTOR) 10 MG tablet Take 1 tablet (10 mg total) by mouth daily.   UNABLE TO FIND Take 600 mg by mouth daily. Med Name: Chromate Pure Vitamin/ Unknown strength   warfarin (COUMADIN) 5 MG tablet TAKE 1 TO 2 TABLETS BY MOUTH DAILY AS DIRECTED BY COUMADIN CLINIC     Allergies:   Adhesive [tape] and  Penicillins   Social History   Socioeconomic History   Marital status: Widowed    Spouse name: Not on file   Number of children: Not on file   Years of education: Not on file   Highest education level: Not on file  Occupational History   Not on file  Tobacco Use   Smoking status: Never    Passive exposure: Past   Smokeless tobacco: Never  Vaping Use   Vaping Use: Never used  Substance and Sexual Activity   Alcohol use: No   Drug use: No   Sexual activity: Not on file  Other Topics Concern   Not on file  Social History Narrative   Not on file   Social Determinants of Health   Financial Resource Strain: Not on file  Food Insecurity: Not on file  Transportation Needs: Not on file  Physical Activity: Not on file  Stress: Not on file  Social Connections: Not on file     Family History: The patient's family history includes Bladder Cancer in her mother; Diabetes in her father; Heart attack in her mother; Heart disease in her father; Hypothyroidism in her mother; Sjogren's syndrome in her daughter; Stroke in her father. There is no history of Colon cancer,  Esophageal cancer, Stomach cancer, or Rectal cancer. ROS:   Please see the history of present illness.    All 14 point review of systems negative except as described per history of present illness  EKGs/Labs/Other Studies Reviewed:      Recent Labs: No results found for requested labs within last 365 days.  Recent Lipid Panel    Component Value Date/Time   CHOL 244 (H) 12/09/2021 1019   TRIG 197 (H) 12/09/2021 1019   HDL 42 12/09/2021 1019   CHOLHDL 5.8 (H) 12/09/2021 1019   LDLCALC 165 (H) 12/09/2021 1019    Physical Exam:    VS:  BP 120/68 (BP Location: Right Arm, Patient Position: Sitting, Cuff Size: Normal)   Pulse 74   Ht 5\' 1"  (1.549 m)   Wt 165 lb (74.8 kg)   LMP  (LMP Unknown)   SpO2 98%   BMI 31.18 kg/m     Wt Readings from Last 3 Encounters:  04/21/22 165 lb (74.8 kg)  11/20/21 157 lb 3.2  oz (71.3 kg)  04/29/21 167 lb (75.8 kg)     GEN:  Well nourished, well developed in no acute distress HEENT: Normal NECK: No JVD; No carotid bruits LYMPHATICS: No lymphadenopathy CARDIAC: RRR, crisp mechanical valve sounds present, systolic murmur grade 1/6 best heard left border of sternum , no rubs, no gallops RESPIRATORY:  Clear to auscultation without rales, wheezing or rhonchi  ABDOMEN: Soft, non-tender, non-distended MUSCULOSKELETAL:  No edema; No deformity  SKIN: Warm and dry LOWER EXTREMITIES: no swelling NEUROLOGIC:  Alert and oriented x 3 PSYCHIATRIC:  Normal affect   ASSESSMENT:    1. S/P minimally invasive mitral valve replacement with mechanical valve    2. Rheumatic heart disease   3. Paroxysmal atrial fibrillation (HCC)   4. Anticoagulation adequate with anticoagulant therapy   5. S/P Minimally invasive maze operation for atrial fibrillation    PLAN:    In order of problems listed above:  Status post minimally invasive valve replacement doing well from that point review echocardiogram checking January looks good.  We will continue present management. Paroxysmal atrial fibrillation, maintaining sinus rhythm, describes one episode of palpitations lasting for short.  Time couple months ago otherwise doing well. Anticoagulated Coumadin will continue. Status post maze procedure.  Noted    Medication Adjustments/Labs and Tests Ordered: Current medicines are reviewed at length with the patient today.  Concerns regarding medicines are outlined above.  No orders of the defined types were placed in this encounter.  Medication changes: No orders of the defined types were placed in this encounter.   Signed, February, MD, [facility] 04/21/2022 10:17 AM    [facility]

## 2022-05-26 ENCOUNTER — Ambulatory Visit (INDEPENDENT_AMBULATORY_CARE_PROVIDER_SITE_OTHER): Payer: Medicare Other

## 2022-05-26 DIAGNOSIS — I480 Paroxysmal atrial fibrillation: Secondary | ICD-10-CM | POA: Diagnosis not present

## 2022-05-26 DIAGNOSIS — Z7901 Long term (current) use of anticoagulants: Secondary | ICD-10-CM

## 2022-05-26 DIAGNOSIS — Z5181 Encounter for therapeutic drug level monitoring: Secondary | ICD-10-CM | POA: Diagnosis not present

## 2022-05-26 DIAGNOSIS — I099 Rheumatic heart disease, unspecified: Secondary | ICD-10-CM

## 2022-05-26 LAB — POCT INR: INR: 3 (ref 2.0–3.0)

## 2022-05-26 NOTE — Patient Instructions (Signed)
Description   Continue taking 2 tablets daily except 2.5 tablets on Sundays.  Recheck in 6 weeks.  Call office with any medication changes or additions [phone].

## 2022-06-30 ENCOUNTER — Ambulatory Visit: Payer: Medicare Other | Attending: Cardiology

## 2022-06-30 DIAGNOSIS — Z5181 Encounter for therapeutic drug level monitoring: Secondary | ICD-10-CM

## 2022-06-30 DIAGNOSIS — I480 Paroxysmal atrial fibrillation: Secondary | ICD-10-CM

## 2022-06-30 DIAGNOSIS — Z7901 Long term (current) use of anticoagulants: Secondary | ICD-10-CM | POA: Diagnosis not present

## 2022-06-30 DIAGNOSIS — I099 Rheumatic heart disease, unspecified: Secondary | ICD-10-CM | POA: Diagnosis not present

## 2022-06-30 LAB — POCT INR: INR: 3.2 — AB (ref 2.0–3.0)

## 2022-06-30 NOTE — Patient Instructions (Signed)
Description   Continue taking 2 tablets daily except 2.5 tablets on Sundays.  Recheck in 6 weeks.  Call office with any medication changes or additions [phone].

## 2022-07-30 ENCOUNTER — Other Ambulatory Visit: Payer: Self-pay | Admitting: Cardiology

## 2022-07-30 DIAGNOSIS — I480 Paroxysmal atrial fibrillation: Secondary | ICD-10-CM

## 2022-08-04 ENCOUNTER — Telehealth: Payer: Self-pay | Admitting: Cardiology

## 2022-08-04 NOTE — Telephone Encounter (Signed)
New Message:    Patient says she have Covid. She says she is scheduled for her Coumadin on next Tuesday(08-11-22). Her question is does she need to have her Coumadin drawn today or sooner than next week?

## 2022-08-04 NOTE — Telephone Encounter (Signed)
Returned pt's call concerning COVID and Coumadin Clinic appt.  Pt stated she was prescribed Paxlovid by Urgent Care yesterday. Confirmed with Karren Cobble, PharmD, this medication is okay to take with Warfarin with minimally affects to INR.   Rescheduled pt's coumadin clinic appt wit 08/18/22. Pt verbalized understanding.

## 2022-08-18 ENCOUNTER — Ambulatory Visit: Payer: Medicare Other | Attending: Cardiology

## 2022-08-18 DIAGNOSIS — I480 Paroxysmal atrial fibrillation: Secondary | ICD-10-CM

## 2022-08-18 DIAGNOSIS — I099 Rheumatic heart disease, unspecified: Secondary | ICD-10-CM

## 2022-08-18 DIAGNOSIS — Z5181 Encounter for therapeutic drug level monitoring: Secondary | ICD-10-CM | POA: Diagnosis not present

## 2022-08-18 DIAGNOSIS — Z7901 Long term (current) use of anticoagulants: Secondary | ICD-10-CM

## 2022-08-18 LAB — POCT INR: INR: 4.5 — AB (ref 2.0–3.0)

## 2022-08-18 NOTE — Patient Instructions (Signed)
Description   HOLD today's dose and only take 1 tablet tomorrow. Then, continue taking 2 tablets daily except 2.5 tablets on Sundays.  Recheck in 2 weeks.  Call office with any medication changes or additions [phone]

## 2022-09-01 ENCOUNTER — Ambulatory Visit: Payer: Medicare Other | Attending: Cardiology

## 2022-09-01 DIAGNOSIS — Z5181 Encounter for therapeutic drug level monitoring: Secondary | ICD-10-CM | POA: Diagnosis not present

## 2022-09-01 DIAGNOSIS — I099 Rheumatic heart disease, unspecified: Secondary | ICD-10-CM | POA: Diagnosis not present

## 2022-09-01 DIAGNOSIS — I480 Paroxysmal atrial fibrillation: Secondary | ICD-10-CM | POA: Diagnosis not present

## 2022-09-01 DIAGNOSIS — Z7901 Long term (current) use of anticoagulants: Secondary | ICD-10-CM | POA: Diagnosis not present

## 2022-09-01 LAB — POCT INR: INR: 4.9 — AB (ref 2.0–3.0)

## 2022-09-01 NOTE — Patient Instructions (Addendum)
Description   HOLD today's dose and eat greens. Then, START taking 2 tablets daily except 1.5 tablets on Wednesdays and Fridays.  Recheck in 2 weeks.  Call office with any medication changes or additions [phone]

## 2022-09-15 ENCOUNTER — Ambulatory Visit: Payer: Medicare Other | Attending: Cardiology

## 2022-09-15 DIAGNOSIS — Z5181 Encounter for therapeutic drug level monitoring: Secondary | ICD-10-CM

## 2022-09-15 DIAGNOSIS — I099 Rheumatic heart disease, unspecified: Secondary | ICD-10-CM

## 2022-09-15 DIAGNOSIS — Z7901 Long term (current) use of anticoagulants: Secondary | ICD-10-CM | POA: Diagnosis not present

## 2022-09-15 DIAGNOSIS — I480 Paroxysmal atrial fibrillation: Secondary | ICD-10-CM

## 2022-09-15 LAB — POCT INR: INR: 2.2 (ref 2.0–3.0)

## 2022-09-15 NOTE — Patient Instructions (Signed)
Description   Take 2 tablets today. Then, START taking 2 tablets daily except 1.5 tablets on Fridays.  Recheck in 2 weeks.  Call office with any medication changes or additions [phone]

## 2022-09-29 ENCOUNTER — Ambulatory Visit: Payer: Medicare Other | Attending: Cardiology

## 2022-09-29 DIAGNOSIS — Z7901 Long term (current) use of anticoagulants: Secondary | ICD-10-CM

## 2022-09-29 DIAGNOSIS — I099 Rheumatic heart disease, unspecified: Secondary | ICD-10-CM | POA: Diagnosis not present

## 2022-09-29 DIAGNOSIS — Z5181 Encounter for therapeutic drug level monitoring: Secondary | ICD-10-CM

## 2022-09-29 DIAGNOSIS — I480 Paroxysmal atrial fibrillation: Secondary | ICD-10-CM

## 2022-09-29 LAB — POCT INR: INR: 3.7 — AB (ref 2.0–3.0)

## 2022-09-29 NOTE — Patient Instructions (Signed)
Description   Only take 1 tablet today and then continue taking 2 tablets daily except 1.5 tablets on Fridays.  Stay consistent with greens each week (2-3 per week)  Recheck in 2 weeks.  Call office with any medication changes or additions [phone]

## 2022-10-13 ENCOUNTER — Ambulatory Visit: Payer: Medicare Other | Attending: Cardiology

## 2022-10-13 DIAGNOSIS — Z5181 Encounter for therapeutic drug level monitoring: Secondary | ICD-10-CM

## 2022-10-13 DIAGNOSIS — I099 Rheumatic heart disease, unspecified: Secondary | ICD-10-CM | POA: Diagnosis not present

## 2022-10-13 DIAGNOSIS — Z7901 Long term (current) use of anticoagulants: Secondary | ICD-10-CM | POA: Diagnosis not present

## 2022-10-13 DIAGNOSIS — I480 Paroxysmal atrial fibrillation: Secondary | ICD-10-CM

## 2022-10-13 LAB — POCT INR: INR: 3.6 — AB (ref 2.0–3.0)

## 2022-10-13 NOTE — Patient Instructions (Signed)
Description   Eat a serving of greens today and continue taking 2 tablets daily except 1.5 tablets on Fridays.  Stay consistent with greens each week (2-3 per week)  Recheck in 3 weeks.  Call office with any medication changes or additions [phone]

## 2022-11-03 ENCOUNTER — Ambulatory Visit: Payer: Medicare Other | Attending: Cardiology

## 2022-11-03 DIAGNOSIS — I099 Rheumatic heart disease, unspecified: Secondary | ICD-10-CM

## 2022-11-03 DIAGNOSIS — I480 Paroxysmal atrial fibrillation: Secondary | ICD-10-CM

## 2022-11-03 DIAGNOSIS — Z5181 Encounter for therapeutic drug level monitoring: Secondary | ICD-10-CM | POA: Diagnosis not present

## 2022-11-03 DIAGNOSIS — Z7901 Long term (current) use of anticoagulants: Secondary | ICD-10-CM

## 2022-11-03 LAB — POCT INR: INR: 3.3 — AB (ref 2.0–3.0)

## 2022-11-03 NOTE — Patient Instructions (Addendum)
Description   Take 2.5 tablets today  and continue taking 2 tablets daily except 1.5 tablets on Fridays.  Stay consistent with greens each week (2-3 per week)  Recheck in 3 weeks.  Call office with any medication changes or additions [phone]

## 2022-11-24 ENCOUNTER — Encounter: Payer: Self-pay | Admitting: Cardiology

## 2022-11-24 ENCOUNTER — Ambulatory Visit (INDEPENDENT_AMBULATORY_CARE_PROVIDER_SITE_OTHER): Payer: Medicare Other

## 2022-11-24 ENCOUNTER — Ambulatory Visit: Payer: Medicare Other | Attending: Cardiology | Admitting: Cardiology

## 2022-11-24 VITALS — BP 120/64 | HR 92 | Ht 61.5 in | Wt 173.0 lb

## 2022-11-24 DIAGNOSIS — I480 Paroxysmal atrial fibrillation: Secondary | ICD-10-CM

## 2022-11-24 DIAGNOSIS — I051 Rheumatic mitral insufficiency: Secondary | ICD-10-CM | POA: Diagnosis not present

## 2022-11-24 DIAGNOSIS — Z9889 Other specified postprocedural states: Secondary | ICD-10-CM | POA: Diagnosis not present

## 2022-11-24 DIAGNOSIS — Z7901 Long term (current) use of anticoagulants: Secondary | ICD-10-CM | POA: Insufficient documentation

## 2022-11-24 DIAGNOSIS — Z8679 Personal history of other diseases of the circulatory system: Secondary | ICD-10-CM | POA: Diagnosis present

## 2022-11-24 DIAGNOSIS — E782 Mixed hyperlipidemia: Secondary | ICD-10-CM | POA: Insufficient documentation

## 2022-11-24 DIAGNOSIS — I099 Rheumatic heart disease, unspecified: Secondary | ICD-10-CM | POA: Insufficient documentation

## 2022-11-24 DIAGNOSIS — R0609 Other forms of dyspnea: Secondary | ICD-10-CM | POA: Insufficient documentation

## 2022-11-24 DIAGNOSIS — Z5181 Encounter for therapeutic drug level monitoring: Secondary | ICD-10-CM

## 2022-11-24 DIAGNOSIS — Z954 Presence of other heart-valve replacement: Secondary | ICD-10-CM | POA: Insufficient documentation

## 2022-11-24 DIAGNOSIS — I5032 Chronic diastolic (congestive) heart failure: Secondary | ICD-10-CM | POA: Insufficient documentation

## 2022-11-24 LAB — POCT INR: INR: 4.3 — AB (ref 2.0–3.0)

## 2022-11-24 MED ORDER — PRAVASTATIN SODIUM 20 MG PO TABS: 20.0000 mg | ORAL_TABLET | Freq: Every evening | ORAL | 3 refills | Status: DC

## 2022-11-24 NOTE — Patient Instructions (Signed)
Medication Instructions:   START: Pravastatin 20mg  1 tablet every day   Lab Work: Your physician recommends that you return for lab work in: 6 weeks You need to have labs done when you are fasting.  You can come Monday through Friday 8:30 am to 12:00 pm and 1:15 to 4:30. You do not need to make an appointment as the order has already been placed. The labs you are going to have done are AST, ALT  Lipids.    Testing/Procedures: Your physician has requested that you have an echocardiogram. Echocardiography is a painless test that uses sound waves to create images of your heart. It provides your doctor with information about the size and shape of your heart and how well your heart's chambers and valves are working. This procedure takes approximately one hour. There are no restrictions for this procedure. Please do NOT wear cologne, perfume, aftershave, or lotions (deodorant is allowed). Please arrive 15 minutes prior to your appointment time.    Follow-Up: At [facility], you and your health needs are our priority.  As part of our continuing mission to provide you with exceptional heart care, we have created designated Provider Care Teams.  These Care Teams include your primary Cardiologist (physician) and Advanced Practice Providers (APPs -  Physician Assistants and Nurse Practitioners) who all work together to provide you with the care you need, when you need it.  We recommend signing up for the patient portal called "MyChart".  Sign up information is provided on this After Visit Summary.  MyChart is used to connect with patients for Virtual Visits (Telemedicine).  Patients are able to view lab/test results, encounter notes, upcoming appointments, etc.  Non-urgent messages can be sent to your provider as well.   To learn more about what you can do with MyChart, go to [email or web address].    Your next appointment:   6 month(s)  The format for your next appointment:   In  Person  Provider:   Jenne Campus, MD    Other Instructions NA

## 2022-11-24 NOTE — Progress Notes (Signed)
Cardiology Office Note:    Date:  11/24/2022   ID:  Melody Torres, DOB 1956-01-27, MRN [id number]  PCP:  Street, Sharon Mt, MD  Cardiologist:  Jenne Campus, MD    Referring MD: Street, Sharon Mt, *   Chief Complaint  Patient presents with   Follow-up    No concern     History of Present Illness:    Melody Torres is a 67 y.o. female with past medical history significant for rheumatic fever, she end up having mitral valve stenosis which was severe, 05/24/2019 she did have minimally invasive mitral valve replacement done with mechanical valve 33 mm Sorin CarboMedics line was done for minimal approach with thoracotomy.  At the same time she had maze procedure done as well as left atrial appendage clipping.  Since that time she is doing very well.  She denies have any chest pain tightness squeezing pressure burning chest, she is still very active, 3 times a week she denies dates that she does not do dancing she will go to senior center to do some classes she enjoyed this tremendously and she is jokingly tell me after 45 minutes dancing she will have some shortness of breath no palpitations no swelling of lower extremities  Past Medical History:  Diagnosis Date   Anticoagulation adequate with anticoagulant therapy 01/20/2016   Anxiety and depression    CHF (congestive heart failure) ([place]) 2017   Chronic anticoagulation    Compression fracture of thoracic spine, non-traumatic ([place]) 09/08/2019   Encounter for screening colonoscopy    Encounter for therapeutic drug monitoring 04/30/2017   GERD (gastroesophageal reflux disease)    Hypercholesterolemia 12/05/2018   Hyperlipidemia    Hyperlipidemia    no meds, diet controlled   IBS (irritable bowel syndrome)    Long term (current) use of anticoagulants [Z79.01] 04/30/2017   Mitral valve disease, rheumatic 12/05/2018   Formatting of this note might be different from the original. TTE 12/19: Left ventricle: The cavity size was  normal. There was mild   concentric hypertrophy. Systolic function was normal. The   estimated ejection fraction was in the range of 55% to 60%. Wall   motion was normal; there were no regional wall motion   abnormalities. - Aortic valve: Valve area (VTI): 1.67 cm^2. Valve area (Vmax):   1.   Obesity with body mass index 30 or greater 11/14/2019   Osteoporosis    Pain in thoracic spine 08/22/2019   Paroxysmal atrial fibrillation ([place]) 01/20/2016   Overview:  Chads score 2 anticoagulated with warfarin   Positive colorectal cancer screening using Cologuard test    Rheumatic heart disease    Rheumatic mitral regurgitation 01/20/2016   Overview:  Mild to moderate   S/P Minimally invasive maze operation for atrial fibrillation 05/24/2019   Complete bilateral atrial lesion set using cryothermy and bipolar radiofrequency ablation with clipping of LA appendage via right mini-thoracotomy approach   S/P minimally invasive mitral valve replacement with mechanical valve 05/24/2019   33 mm Sorin Carbomedics bileaflet mechanical valve via right mini thoracotomy approach   Severe mitral valve stenosis 01/20/2016   Tricuspid regurgitation     Past Surgical History:  Procedure Laterality Date   CLIPPING OF ATRIAL APPENDAGE N/A 05/24/2019   Procedure: Clipping Of Atrial Appendage Using AtriCure PRO2 clip size 43mm;  Surgeon: Rexene Alberts, MD;  Location: [place];  Service: Open Heart Surgery;  Laterality: N/A;   COLONOSCOPY  06/14/2008   Mild sigmoid diverticulosis. Otherwise normal colonoscopy  COLONOSCOPY WITH PROPOFOL N/A 06/17/2020   Procedure: COLONOSCOPY WITH PROPOFOL;  Surgeon: Jackquline Denmark, MD;  Location: [facility];  Service: Endoscopy;  Laterality: N/A;   ESOPHAGOGASTRODUODENOSCOPY  05/18/2011   Schatzki ring status post esophageal dilatation. Small hiatal hernia. Incidential gastic polyps (status post polypectomy x2). Duodenal ulcers.    MINIMALLY INVASIVE MAZE PROCEDURE N/A 05/24/2019   Procedure:  MINIMALLY INVASIVE MAZE PROCEDURE;  Surgeon: Rexene Alberts, MD;  Location: [place];  Service: Open Heart Surgery;  Laterality: N/A;   MITRAL VALVE REPLACEMENT Right 05/24/2019   Procedure: MINIMALLY INVASIVE MITRAL VALVE (MV) REPLACEMENT Using Carbomedics Optiform Heart Valve Size 59mm;  Surgeon: Rexene Alberts, MD;  Location: [place];  Service: Open Heart Surgery;  Laterality: Right;   RIGHT/LEFT HEART CATH AND CORONARY ANGIOGRAPHY N/A 04/07/2019   Procedure: RIGHT/LEFT HEART CATH AND CORONARY ANGIOGRAPHY;  Surgeon: Leonie Man, MD;  Location: [place] [facility];  Service: Cardiovascular;  Laterality: N/A;   TEE WITHOUT CARDIOVERSION N/A 01/06/2019   Procedure: TRANSESOPHAGEAL ECHOCARDIOGRAM (TEE);  Surgeon: Dorothy Spark, MD;  Location: [place];  Service: Cardiovascular;  Laterality: N/A;   TEE WITHOUT CARDIOVERSION N/A 05/24/2019   Procedure: TRANSESOPHAGEAL ECHOCARDIOGRAM (TEE);  Surgeon: Rexene Alberts, MD;  Location: [place];  Service: Open Heart Surgery;  Laterality: N/A;   WISDOM TOOTH EXTRACTION      Current Medications: Current Meds  Medication Sig   acetaminophen (TYLENOL) 500 MG tablet Take 500 mg by mouth as needed for moderate pain or headache.    aspirin EC 81 MG EC tablet Take 1 tablet (81 mg total) by mouth daily.   clobetasol cream (TEMOVATE) 4.09 % Apply 1 application topically 2 (two) times daily as needed (for lichen sclerosus).    loratadine (CLARITIN) 10 MG tablet Take 10 mg by mouth every other day.    Multiple Vitamin (MULTIVITAMIN) tablet Take 1 tablet by mouth daily. Unknown strenght   UNABLE TO FIND Take 600 mg by mouth daily. Med Name: Chromate Pure Vitamin/ Unknown strength   warfarin (COUMADIN) 5 MG tablet TAKE 2 to 2 AND 1/2 TABLETS BY MOUTH DAILY AS DIRECTED by Anticoagulation Clinic (Patient taking differently: Take 5 mg by mouth See admin instructions. TAKE 2 to 2 AND 1/2 TABLETS BY MOUTH DAILY AS DIRECTED by Anticoagulation Clinic)    [DISCONTINUED] rosuvastatin (CRESTOR) 10 MG tablet Take 1 tablet (10 mg total) by mouth daily.     Allergies:   Adhesive [tape], Rosuvastatin, and Penicillins   Social History   Socioeconomic History   Marital status: Widowed    Spouse name: Not on file   Number of children: Not on file   Years of education: Not on file   Highest education level: Not on file  Occupational History   Not on file  Tobacco Use   Smoking status: Never    Passive exposure: Past   Smokeless tobacco: Never  Vaping Use   Vaping Use: Never used  Substance and Sexual Activity   Alcohol use: No   Drug use: No   Sexual activity: Not on file  Other Topics Concern   Not on file  Social History Narrative   Not on file   Social Determinants of Health   Financial Resource Strain: Not on file  Food Insecurity: Not on file  Transportation Needs: Not on file  Physical Activity: Not on file  Stress: Not on file  Social Connections: Not on file     Family History: The patient's family history includes  Bladder Cancer in her mother; Diabetes in her father; Heart attack in her mother; Heart disease in her father; Hypothyroidism in her mother; Sjogren's syndrome in her daughter; Stroke in her father. There is no history of Colon cancer, Esophageal cancer, Stomach cancer, or Rectal cancer. ROS:   Please see the history of present illness.    All 14 point review of systems negative except as described per history of present illness  EKGs/Labs/Other Studies Reviewed:      Recent Labs: No results found for requested labs within last 365 days.  Recent Lipid Panel    Component Value Date/Time   CHOL 244 (H) 12/09/2021 1019   TRIG 197 (H) 12/09/2021 1019   HDL 42 12/09/2021 1019   CHOLHDL 5.8 (H) 12/09/2021 1019   LDLCALC 165 (H) 12/09/2021 1019    Physical Exam:    VS:  BP (!) 88/46 (BP Location: Left Arm, Patient Position: Sitting)   Pulse 92   Ht 5' 1.5" (1.562 m)   Wt 173 lb (78.5 kg)   LMP   (LMP Unknown)   SpO2 94%   BMI 32.16 kg/m     Wt Readings from Last 3 Encounters:  11/24/22 173 lb (78.5 kg)  04/21/22 165 lb (74.8 kg)  11/20/21 157 lb 3.2 oz (71.3 kg)     GEN:  Well nourished, well developed in no acute distress HEENT: Normal NECK: No JVD; No carotid bruits LYMPHATICS: No lymphadenopathy CARDIAC: RRR, no murmurs, crisp mechanical valve sounds no rubs, no gallops RESPIRATORY:  Clear to auscultation without rales, wheezing or rhonchi  ABDOMEN: Soft, non-tender, non-distended MUSCULOSKELETAL:  No edema; No deformity  SKIN: Warm and dry LOWER EXTREMITIES: no swelling NEUROLOGIC:  Alert and oriented x 3 PSYCHIATRIC:  Normal affect   ASSESSMENT:    1. S/P Minimally invasive maze operation for atrial fibrillation   2. S/P minimally invasive mitral valve replacement with mechanical valve    3. Rheumatic mitral regurgitation   4. Paroxysmal atrial fibrillation ([facility])   5. Chronic diastolic congestive heart failure ([place])   6. Mixed hyperlipidemia    PLAN:    In order of problems listed above:  Status post minimally invasive mitral valve replacement.  Doing well from that point review.  Will get echocardiogram to assess the valve. Status post minimally invasive maze procedure for atrial fibrillation.  Maintaining sinus rhythm, anticoagulated with Coumadin which I will continue. Rheumatic mitral valve disease status post surgery as described above. Chronic diastolic congestive heart failure, hemodynamically compensated we will continue present management. Dyslipidemia I did review her K PN which show me LDL 165, HDL 42.  She is on Crestor but was unable to tolerate it and stopped it because of pain in her hips.  Will put her on 20 mg of rosuvastatin.   Medication Adjustments/Labs and Tests Ordered: Current medicines are reviewed at length with the patient today.  Concerns regarding medicines are outlined above.  No orders of the defined types were placed in this  encounter.  Medication changes: No orders of the defined types were placed in this encounter.   Signed, Park Liter, MD, [facility] 11/24/2022 8:28 AM    [place]

## 2022-11-24 NOTE — Patient Instructions (Signed)
Description   HOLD today's dose and START taking 2 tablets daily except 1.5 tablets on Mondays and Fridays.  Stay consistent with greens each week (2-3 per week)  Recheck in 2 weeks.  Call office with any medication changes or additions [phone]

## 2022-11-24 NOTE — Addendum Note (Signed)
Addended by: Jacobo Forest D on: 11/24/2022 08:35 AM   Modules accepted: Orders

## 2022-11-25 ENCOUNTER — Ambulatory Visit: Payer: Medicare Other | Attending: Cardiology

## 2022-11-25 DIAGNOSIS — R0609 Other forms of dyspnea: Secondary | ICD-10-CM | POA: Diagnosis not present

## 2022-11-25 LAB — ECHOCARDIOGRAM COMPLETE
Area-P 1/2: 2.87 cm2
Calc EF: 48.2 %
MV VTI: 1.63 cm2
S' Lateral: 3.1 cm
Single Plane A2C EF: 46.6 %
Single Plane A4C EF: 50.1 %

## 2022-11-30 ENCOUNTER — Telehealth: Payer: Self-pay

## 2022-11-30 NOTE — Telephone Encounter (Signed)
Results reviewed with pt as per Dr. Krasowski's note.  Pt verbalized understanding and had no additional questions. Routed to PCP  

## 2022-12-03 ENCOUNTER — Encounter (HOSPITAL_COMMUNITY): Payer: Self-pay | Admitting: *Deleted

## 2022-12-08 ENCOUNTER — Ambulatory Visit: Payer: Medicare Other | Attending: Cardiology

## 2022-12-08 DIAGNOSIS — Z7901 Long term (current) use of anticoagulants: Secondary | ICD-10-CM | POA: Diagnosis present

## 2022-12-08 DIAGNOSIS — Z5181 Encounter for therapeutic drug level monitoring: Secondary | ICD-10-CM | POA: Diagnosis present

## 2022-12-08 DIAGNOSIS — I480 Paroxysmal atrial fibrillation: Secondary | ICD-10-CM | POA: Diagnosis present

## 2022-12-08 DIAGNOSIS — I099 Rheumatic heart disease, unspecified: Secondary | ICD-10-CM | POA: Diagnosis present

## 2022-12-08 LAB — POCT INR: INR: 3.3 — AB (ref 2.0–3.0)

## 2022-12-08 MED ORDER — PRAVASTATIN SODIUM 20 MG PO TABS: 20.0000 mg | ORAL_TABLET | Freq: Every evening | ORAL | 3 refills | Status: DC

## 2022-12-08 NOTE — Patient Instructions (Signed)
Description   Continue taking 2 tablets daily except 1.5 tablets on Mondays and Fridays.  Stay consistent with greens each week (2-3 per week)  Recheck in 3 weeks.  Call office with any medication changes or additions [phone]

## 2022-12-08 NOTE — Addendum Note (Signed)
Addended by: Jacobo Forest D on: 12/08/2022 10:39 AM   Modules accepted: Orders

## 2022-12-29 ENCOUNTER — Ambulatory Visit: Payer: Medicare Other | Attending: Cardiology

## 2022-12-29 DIAGNOSIS — I099 Rheumatic heart disease, unspecified: Secondary | ICD-10-CM | POA: Diagnosis present

## 2022-12-29 DIAGNOSIS — I480 Paroxysmal atrial fibrillation: Secondary | ICD-10-CM | POA: Insufficient documentation

## 2022-12-29 DIAGNOSIS — Z7901 Long term (current) use of anticoagulants: Secondary | ICD-10-CM

## 2022-12-29 DIAGNOSIS — Z5181 Encounter for therapeutic drug level monitoring: Secondary | ICD-10-CM | POA: Diagnosis present

## 2022-12-29 LAB — POCT INR: INR: 3.1 — AB (ref 2.0–3.0)

## 2022-12-29 NOTE — Patient Instructions (Signed)
Description   Continue taking 2 tablets daily except 1.5 tablets on Mondays and Fridays.  Stay consistent with greens each week (2-3 per week)  Recheck in 4 weeks.  Call office with any medication changes or additions [phone]

## 2023-01-08 ENCOUNTER — Other Ambulatory Visit: Payer: Self-pay | Admitting: Cardiology

## 2023-01-08 DIAGNOSIS — I480 Paroxysmal atrial fibrillation: Secondary | ICD-10-CM

## 2023-01-08 NOTE — Telephone Encounter (Signed)
Prescription refill request received for warfarin Lov: Rachael Fee Next INR check: 4/9 Warfarin tablet strength: 5mg 

## 2023-02-02 ENCOUNTER — Ambulatory Visit: Payer: Medicare Other | Attending: Cardiology

## 2023-02-02 DIAGNOSIS — Z5181 Encounter for therapeutic drug level monitoring: Secondary | ICD-10-CM | POA: Diagnosis present

## 2023-02-02 DIAGNOSIS — I099 Rheumatic heart disease, unspecified: Secondary | ICD-10-CM | POA: Diagnosis present

## 2023-02-02 DIAGNOSIS — I480 Paroxysmal atrial fibrillation: Secondary | ICD-10-CM | POA: Insufficient documentation

## 2023-02-02 DIAGNOSIS — Z7901 Long term (current) use of anticoagulants: Secondary | ICD-10-CM

## 2023-02-02 LAB — POCT INR: INR: 3.2 — AB (ref 2.0–3.0)

## 2023-02-02 NOTE — Patient Instructions (Addendum)
Description   Continue taking 2 tablets daily except 1.5 tablets on Mondays and Fridays.  Stay consistent with greens each week (2-3 per week)  Recheck in 5 weeks.  Call office with any medication changes or additions [phone]

## 2023-03-09 ENCOUNTER — Ambulatory Visit: Payer: Medicare Other | Attending: Cardiology

## 2023-03-09 DIAGNOSIS — I099 Rheumatic heart disease, unspecified: Secondary | ICD-10-CM

## 2023-03-09 DIAGNOSIS — Z7901 Long term (current) use of anticoagulants: Secondary | ICD-10-CM

## 2023-03-09 DIAGNOSIS — I480 Paroxysmal atrial fibrillation: Secondary | ICD-10-CM | POA: Diagnosis present

## 2023-03-09 DIAGNOSIS — Z5181 Encounter for therapeutic drug level monitoring: Secondary | ICD-10-CM | POA: Diagnosis present

## 2023-03-09 LAB — POCT INR: INR: 4.7 — AB (ref 2.0–3.0)

## 2023-03-09 NOTE — Patient Instructions (Signed)
Description   Hold today's dose and eat a serving of greens and then continue taking 2 tablets daily except 1.5 tablets on Mondays and Fridays.  Stay consistent with greens each week (2-3 per week)  Recheck in 2 weeks.  Call office with any medication changes or additions [phone]

## 2023-03-23 ENCOUNTER — Ambulatory Visit: Payer: Medicare Other | Attending: Cardiology

## 2023-03-23 DIAGNOSIS — I480 Paroxysmal atrial fibrillation: Secondary | ICD-10-CM | POA: Insufficient documentation

## 2023-03-23 DIAGNOSIS — I099 Rheumatic heart disease, unspecified: Secondary | ICD-10-CM | POA: Diagnosis present

## 2023-03-23 DIAGNOSIS — Z7901 Long term (current) use of anticoagulants: Secondary | ICD-10-CM | POA: Insufficient documentation

## 2023-03-23 DIAGNOSIS — Z5181 Encounter for therapeutic drug level monitoring: Secondary | ICD-10-CM | POA: Insufficient documentation

## 2023-03-23 LAB — POCT INR: INR: 3.6 — AB (ref 2.0–3.0)

## 2023-03-23 NOTE — Patient Instructions (Signed)
Description   Only take 1.5 tablets today and then continue taking 2 tablets daily except 1.5 tablets on Mondays and Fridays.  Stay consistent with greens each week (2-3 per week)  Recheck in 4 weeks.  Call office with any medication changes or additions [phone]

## 2023-04-13 ENCOUNTER — Other Ambulatory Visit: Payer: Self-pay | Admitting: Cardiology

## 2023-04-13 DIAGNOSIS — I480 Paroxysmal atrial fibrillation: Secondary | ICD-10-CM

## 2023-04-20 ENCOUNTER — Ambulatory Visit: Payer: Medicare Other | Attending: Cardiology

## 2023-04-20 DIAGNOSIS — I099 Rheumatic heart disease, unspecified: Secondary | ICD-10-CM | POA: Insufficient documentation

## 2023-04-20 DIAGNOSIS — Z5181 Encounter for therapeutic drug level monitoring: Secondary | ICD-10-CM | POA: Insufficient documentation

## 2023-04-20 DIAGNOSIS — I480 Paroxysmal atrial fibrillation: Secondary | ICD-10-CM | POA: Diagnosis present

## 2023-04-20 DIAGNOSIS — Z7901 Long term (current) use of anticoagulants: Secondary | ICD-10-CM | POA: Diagnosis present

## 2023-04-20 LAB — POCT INR: INR: 4.2 — AB (ref 2.0–3.0)

## 2023-04-20 NOTE — Patient Instructions (Signed)
Description   HOLD today's dose and then START taking 2 tablets daily except 1.5 tablets on Mondays Wednesday, and Fridays.  Stay consistent with greens each week (2-3 per week)  Recheck in 2 weeks.  Call office with any medication changes or additions [phone]

## 2023-05-04 ENCOUNTER — Ambulatory Visit: Payer: Medicare Other | Attending: Cardiology

## 2023-05-04 DIAGNOSIS — Z7901 Long term (current) use of anticoagulants: Secondary | ICD-10-CM | POA: Diagnosis not present

## 2023-05-04 DIAGNOSIS — I099 Rheumatic heart disease, unspecified: Secondary | ICD-10-CM

## 2023-05-04 DIAGNOSIS — I480 Paroxysmal atrial fibrillation: Secondary | ICD-10-CM | POA: Diagnosis not present

## 2023-05-04 DIAGNOSIS — Z5181 Encounter for therapeutic drug level monitoring: Secondary | ICD-10-CM

## 2023-05-04 LAB — POCT INR: INR: 2.9 (ref 2.0–3.0)

## 2023-05-04 NOTE — Patient Instructions (Signed)
Description   Continue taking 2 tablets daily except 1.5 tablets on Mondays Wednesday, and Fridays.  Stay consistent with greens each week (2-3 per week)  Recheck in 3 weeks.  Call office with any medication changes or additions [phone]

## 2023-05-25 ENCOUNTER — Ambulatory Visit: Payer: Medicare Other | Attending: Cardiology

## 2023-05-25 DIAGNOSIS — Z5181 Encounter for therapeutic drug level monitoring: Secondary | ICD-10-CM | POA: Insufficient documentation

## 2023-05-25 DIAGNOSIS — I480 Paroxysmal atrial fibrillation: Secondary | ICD-10-CM | POA: Diagnosis not present

## 2023-05-25 DIAGNOSIS — Z7901 Long term (current) use of anticoagulants: Secondary | ICD-10-CM | POA: Diagnosis not present

## 2023-05-25 DIAGNOSIS — I099 Rheumatic heart disease, unspecified: Secondary | ICD-10-CM | POA: Insufficient documentation

## 2023-05-25 LAB — POCT INR: INR: 3.4 — AB (ref 2.0–3.0)

## 2023-05-25 NOTE — Patient Instructions (Addendum)
Description   Continue taking 2 tablets daily except 1.5 tablets on Mondays Wednesday, and Fridays.  Stay consistent with greens each week (2-3 per week)  Recheck in 3 weeks.  Call office with any medication changes or additions [phone]

## 2023-06-15 ENCOUNTER — Ambulatory Visit (INDEPENDENT_AMBULATORY_CARE_PROVIDER_SITE_OTHER): Payer: Medicare Other

## 2023-06-15 ENCOUNTER — Ambulatory Visit: Payer: Medicare Other | Attending: Cardiology | Admitting: Cardiology

## 2023-06-15 ENCOUNTER — Encounter: Payer: Self-pay | Admitting: Cardiology

## 2023-06-15 VITALS — BP 106/69 | HR 83 | Ht 61.0 in | Wt 175.0 lb

## 2023-06-15 DIAGNOSIS — I480 Paroxysmal atrial fibrillation: Secondary | ICD-10-CM | POA: Insufficient documentation

## 2023-06-15 DIAGNOSIS — Z7901 Long term (current) use of anticoagulants: Secondary | ICD-10-CM | POA: Insufficient documentation

## 2023-06-15 DIAGNOSIS — Z954 Presence of other heart-valve replacement: Secondary | ICD-10-CM | POA: Insufficient documentation

## 2023-06-15 DIAGNOSIS — Z9889 Other specified postprocedural states: Secondary | ICD-10-CM | POA: Insufficient documentation

## 2023-06-15 DIAGNOSIS — Z5181 Encounter for therapeutic drug level monitoring: Secondary | ICD-10-CM | POA: Diagnosis present

## 2023-06-15 DIAGNOSIS — I099 Rheumatic heart disease, unspecified: Secondary | ICD-10-CM | POA: Diagnosis present

## 2023-06-15 DIAGNOSIS — Z8679 Personal history of other diseases of the circulatory system: Secondary | ICD-10-CM | POA: Diagnosis present

## 2023-06-15 DIAGNOSIS — I059 Rheumatic mitral valve disease, unspecified: Secondary | ICD-10-CM | POA: Insufficient documentation

## 2023-06-15 DIAGNOSIS — I5032 Chronic diastolic (congestive) heart failure: Secondary | ICD-10-CM | POA: Diagnosis present

## 2023-06-15 LAB — POCT INR: INR: 3.9 — AB (ref 2.0–3.0)

## 2023-06-15 NOTE — Patient Instructions (Signed)

## 2023-06-15 NOTE — Progress Notes (Signed)
Cardiology Office Note:    Date:  06/15/2023   ID:  Melody Torres, DOB 02-Aug-1956, MRN [id number]  PCP:  Melody Harrison Melody Coup, MD  Cardiologist:  Gypsy Balsam, MD    Referring MD: Melody Torres, Melody Torres, *   Chief Complaint  Patient presents with   Medication Management    Pravastatin: possible causing leg/feet cramps     History of Present Illness:    Melody Torres is a 67 y.o. female with past medical history significant for rheumatic fever, she end up having mitral valve stenosis which was severe, 05/24/2019 she did have minimally invasive mitral valve replacement done with mechanical valve 33 mm Sorin [employer]bo[employer] line was done for minimal approach with thoracotomy. At the same time she had maze procedure done as well as left atrial appendage clipping. Since that time she is doing very well.  Comes today to months for follow-up.  Overall she is doing well.  Denies of any chest pain tightness squeezing pressure burning chest, she does complain of having some cramps which happens at the evening time at night when she is sleeps.  She thinks related to pravastatin.  Otherwise she still keeps dancing about 4-5 times a week have no difficulty doing  Past Medical History:  Diagnosis Date   Anticoagulation adequate with anticoagulant therapy 01/20/2016   Anxiety and depression    CHF (congestive heart failure) (HCC) 2017   Chronic anticoagulation    Compression fracture of thoracic spine, non-traumatic (HCC) 09/08/2019   Encounter for screening colonoscopy    Encounter for therapeutic drug monitoring 04/30/2017   GERD (gastroesophageal reflux disease)    Hypercholesterolemia 12/05/2018   Hyperlipidemia    Hyperlipidemia    no meds, diet controlled   IBS (irritable bowel syndrome)    Long term (current) use of anticoagulants [Z79.01] 04/30/2017   Mitral valve disease, rheumatic 12/05/2018   Formatting of this note might be different from the original. TTE 12/19: Left ventricle:  The cavity size was normal. There was mild   concentric hypertrophy. Systolic function was normal. The   estimated ejection fraction was in the range of 55% to 60%. Wall   motion was normal; there were no regional wall motion   abnormalities. - Aortic valve: Valve area (VTI): 1.67 cm^2. Valve area (Vmax):   1.   Obesity with body mass index 30 or greater 11/14/2019   Osteoporosis    Pain in thoracic spine 08/22/2019   Paroxysmal atrial fibrillation (HCC) 01/20/2016   Overview:  Chads score 2 anticoagulated with warfarin   Positive colorectal cancer screening using Cologuard test    Rheumatic heart disease    Rheumatic mitral regurgitation 01/20/2016   Overview:  Mild to moderate   S/P Minimally invasive maze operation for atrial fibrillation 05/24/2019   Complete bilateral atrial lesion set using cryothermy and bipolar radiofrequency ablation with clipping of LA appendage via right mini-thoracotomy approach   S/P minimally invasive mitral valve replacement with mechanical valve 05/24/2019   33 mm Sorin Carbomedics bileaflet mechanical valve via right mini thoracotomy approach   Severe mitral valve stenosis 01/20/2016   Tricuspid regurgitation     Past Surgical History:  Procedure Laterality Date   CLIPPING OF ATRIAL APPENDAGE N/A 05/24/2019   Procedure: Clipping Of Atrial Appendage Using AtriCure PRO2 clip size 45mm;  Surgeon: Purcell Nails, MD;  Location: [facility] OR;  Service: Open Heart Surgery;  Laterality: N/A;   COLONOSCOPY  06/14/2008   Mild sigmoid diverticulosis. Otherwise normal colonoscopy   COLONOSCOPY  WITH PROPOFOL N/A 06/17/2020   Procedure: COLONOSCOPY WITH PROPOFOL;  Surgeon: Lynann Bologna, MD;  Location: [facility] ENDOSCOPY;  Service: Endoscopy;  Laterality: N/A;   ESOPHAGOGASTRODUODENOSCOPY  05/18/2011   Schatzki ring status post esophageal dilatation. Small hiatal hernia. Incidential gastic polyps (status post polypectomy x2). Duodenal ulcers.    MINIMALLY INVASIVE MAZE PROCEDURE N/A  05/24/2019   Procedure: MINIMALLY INVASIVE MAZE PROCEDURE;  Surgeon: Purcell Nails, MD;  Location: [facility] OR;  Service: Open Heart Surgery;  Laterality: N/A;   MITRAL VALVE REPLACEMENT Right 05/24/2019   Procedure: MINIMALLY INVASIVE MITRAL VALVE (MV) REPLACEMENT Using Carbomedics Optiform Heart Valve Size 33mm;  Surgeon: Purcell Nails, MD;  Location: [facility] OR;  Service: Open Heart Surgery;  Laterality: Right;   RIGHT/LEFT HEART CATH AND CORONARY ANGIOGRAPHY N/A 04/07/2019   Procedure: RIGHT/LEFT HEART CATH AND CORONARY ANGIOGRAPHY;  Surgeon: Marykay Lex, MD;  Location: [facility] INVASIVE CV LAB;  Service: Cardiovascular;  Laterality: N/A;   TEE WITHOUT CARDIOVERSION N/A 01/06/2019   Procedure: TRANSESOPHAGEAL ECHOCARDIOGRAM (TEE);  Surgeon: Lars Masson, MD;  Location: [facility] ENDOSCOPY;  Service: Cardiovascular;  Laterality: N/A;   TEE WITHOUT CARDIOVERSION N/A 05/24/2019   Procedure: TRANSESOPHAGEAL ECHOCARDIOGRAM (TEE);  Surgeon: Purcell Nails, MD;  Location: [facility] OR;  Service: Open Heart Surgery;  Laterality: N/A;   WISDOM TOOTH EXTRACTION      Current Medications: Current Meds  Medication Sig   acetaminophen (TYLENOL) 500 MG tablet Take 500 mg by mouth as needed for moderate pain or headache.    aspirin EC 81 MG EC tablet Take 1 tablet (81 mg total) by mouth daily.   clobetasol cream (TEMOVATE) 0.05 % Apply 1 application topically 2 (two) times daily as needed (for lichen sclerosus).    loratadine (CLARITIN) 10 MG tablet Take 10 mg by mouth every other day.    Multiple Vitamin (MULTIVITAMIN) tablet Take 1 tablet by mouth daily. Unknown strenght   pravastatin (PRAVACHOL) 20 MG tablet Take 1 tablet (20 mg total) by mouth every evening.   UNABLE TO FIND Take 600 mg by mouth daily. Med Name: Chromate Pure Vitamin/ Unknown strength   warfarin (COUMADIN) 5 MG tablet TAKE 1&1/2 TO 2 TABLETS BY MOUTH DAILY AS DIRECTED BY THE COUMADIN CLINIC (Patient taking differently: Take 5 mg by mouth See admin  instructions. Take 1&1/2 to 2 tablets by mouth daily as directed by the coumadin clinic)     Allergies:   Adhesive [tape], Rosuvastatin, and Penicillins   Social History   Socioeconomic History   Marital status: Widowed    Spouse name: Not on file   Number of children: Not on file   Years of education: Not on file   Highest education level: Not on file  Occupational History   Not on file  Tobacco Use   Smoking status: Never    Passive exposure: Past   Smokeless tobacco: Never  Vaping Use   Vaping status: Never Used  Substance and Sexual Activity   Alcohol use: No   Drug use: No   Sexual activity: Not on file  Other Topics Concern   Not on file  Social History Narrative   Not on file   Social Determinants of Health   Financial Resource Strain: Not on file  Food Insecurity: Not on file  Transportation Needs: Not on file  Physical Activity: Not on file  Stress: Not on file  Social Connections: Not on file     Family History: The patient's family history includes Bladder Cancer in  her mother; Diabetes in her father; Heart attack in her mother; Heart disease in her father; Hypothyroidism in her mother; Sjogren's syndrome in her daughter; Stroke in her father. There is no history of Colon cancer, Esophageal cancer, Stomach cancer, or Rectal cancer. ROS:   Please see the history of present illness.    All 14 point review of systems negative except as described per history of present illness  EKGs/Labs/Other Studies Reviewed:         Recent Labs: No results found for requested labs within last 365 days.  Recent Lipid Panel    Component Value Date/Time   CHOL 244 (H) 12/09/2021 1019   TRIG 197 (H) 12/09/2021 1019   HDL 42 12/09/2021 1019   CHOLHDL 5.8 (H) 12/09/2021 1019   LDLCALC 165 (H) 12/09/2021 1019    Physical Exam:    VS:  BP 106/69 (BP Location: Left Arm, Patient Position: Sitting)   Pulse 83   Ht 5\' 1"  (1.549 m)   Wt 175 lb (79.4 kg)   LMP  (LMP  Unknown)   SpO2 95%   BMI 33.07 kg/m     Wt Readings from Last 3 Encounters:  06/15/23 175 lb (79.4 kg)  11/24/22 173 lb (78.5 kg)  04/21/22 165 lb (74.8 kg)     GEN:  Well nourished, well developed in no acute distress HEENT: Normal NECK: No JVD; No carotid bruits LYMPHATICS: No lymphadenopathy CARDIAC: RRR, soft systolic murmur grade 1/6 best heard left border of the sternum, crisp mechanical valve sounds no rubs, no gallops RESPIRATORY:  Clear to auscultation without rales, wheezing or rhonchi  ABDOMEN: Soft, non-tender, non-distended MUSCULOSKELETAL:  No edema; No deformity  SKIN: Warm and dry LOWER EXTREMITIES: no swelling NEUROLOGIC:  Alert and oriented x 3 PSYCHIATRIC:  Normal affect   ASSESSMENT:    1. Mitral valve disease, rheumatic   2. Chronic diastolic congestive heart failure (HCC)   3. Paroxysmal atrial fibrillation (HCC)   4. Chronic anticoagulation   5. S/P Minimally invasive maze operation for atrial fibrillation   6. S/P minimally invasive mitral valve replacement with mechanical valve     PLAN:    In order of problems listed above:  Status post mitral valve replacement with mechanical prosthesis, functioning properly, she is on Coumadin which I will continue. Paroxysmal atrial fibrillation maintaining sinus rhythm status post Maze procedure status post appendage amputation.  Need to be anticoagulated because of mechanical valve. Chronic diastolic congestive heart failure.  Compensated. Dyslipidemia she is taking small dose of pravastatin but she is convinced that the give her cramps she wants to stop it however I told her to stop for about 2 weeks see if cramps gets better if that is the case ask her to let me know then we will switch to different medication that we will be not statin related. I encouraged her to keep being active.   Medication Adjustments/Labs and Tests Ordered: Current medicines are reviewed at length with the patient today.  Concerns  regarding medicines are outlined above.  No orders of the defined types were placed in this encounter.  Medication changes: No orders of the defined types were placed in this encounter.   Signed, Georgeanna Lea, MD, [facility] 06/15/2023 3:11 PM    [facility]

## 2023-06-15 NOTE — Patient Instructions (Signed)
Description   Only take 1 tablet today and then continue taking 2 tablets daily except 1.5 tablets on Mondays Wednesday, and Fridays.  Stay consistent with greens each week (2-3 per week)  Recheck in 3 weeks.  Call office with any medication changes or additions [phone]

## 2023-07-06 ENCOUNTER — Encounter: Payer: Self-pay | Admitting: Cardiology

## 2023-07-06 ENCOUNTER — Ambulatory Visit: Payer: Medicare Other | Attending: Cardiology

## 2023-07-06 DIAGNOSIS — I480 Paroxysmal atrial fibrillation: Secondary | ICD-10-CM

## 2023-07-06 DIAGNOSIS — I099 Rheumatic heart disease, unspecified: Secondary | ICD-10-CM

## 2023-07-06 DIAGNOSIS — Z7901 Long term (current) use of anticoagulants: Secondary | ICD-10-CM

## 2023-07-06 DIAGNOSIS — Z5181 Encounter for therapeutic drug level monitoring: Secondary | ICD-10-CM | POA: Diagnosis not present

## 2023-07-06 LAB — POCT INR: INR: 3.2 — AB (ref 2.0–3.0)

## 2023-07-06 NOTE — Patient Instructions (Signed)
Description   Continue taking 2 tablets daily except 1.5 tablets on Mondays Wednesday, and Fridays.  Stay consistent with greens each week (2-3 per week)  Recheck in 4 weeks.  Call office with any medication changes or additions [phone]

## 2023-07-07 ENCOUNTER — Other Ambulatory Visit: Payer: Self-pay

## 2023-07-07 ENCOUNTER — Telehealth: Payer: Self-pay

## 2023-07-07 MED ORDER — EZETIMIBE 10 MG PO TABS: 10.0000 mg | ORAL_TABLET | Freq: Every day | ORAL | 3 refills | Status: DC

## 2023-07-07 NOTE — Telephone Encounter (Signed)
Zetia 10mg  sent to [facility] per pt

## 2023-07-11 ENCOUNTER — Other Ambulatory Visit: Payer: Self-pay | Admitting: Cardiology

## 2023-07-11 DIAGNOSIS — I480 Paroxysmal atrial fibrillation: Secondary | ICD-10-CM

## 2023-07-21 ENCOUNTER — Encounter: Payer: Self-pay | Admitting: Cardiology

## 2023-07-22 NOTE — Telephone Encounter (Signed)
Called pt and scheduled Coumadin clinic appt in [place] on Tuesday, 07/27/23 at 9:45am.

## 2023-07-27 ENCOUNTER — Ambulatory Visit: Payer: Medicare Other | Attending: Cardiology

## 2023-07-27 DIAGNOSIS — I480 Paroxysmal atrial fibrillation: Secondary | ICD-10-CM | POA: Insufficient documentation

## 2023-07-27 DIAGNOSIS — Z5181 Encounter for therapeutic drug level monitoring: Secondary | ICD-10-CM | POA: Diagnosis not present

## 2023-07-27 DIAGNOSIS — I099 Rheumatic heart disease, unspecified: Secondary | ICD-10-CM | POA: Diagnosis present

## 2023-07-27 DIAGNOSIS — Z7901 Long term (current) use of anticoagulants: Secondary | ICD-10-CM | POA: Insufficient documentation

## 2023-07-27 LAB — POCT INR: INR: 3.2 — AB (ref 2.0–3.0)

## 2023-07-27 NOTE — Patient Instructions (Signed)
Description   Continue taking 2 tablets daily except 1.5 tablets on Mondays Wednesday, and Fridays.  Stay consistent with greens each week (2-3 per week)  Recheck in 4 weeks.  Call office with any medication changes or additions [phone]

## 2023-08-13 ENCOUNTER — Telehealth: Payer: Self-pay | Admitting: Cardiology

## 2023-08-13 NOTE — Telephone Encounter (Signed)
Patient states that she has a bruise on her arm, that is black and blue from a cabinet falling on it. Calling in to see if she should be concern since she is on blood thinner. Please advise

## 2023-08-13 NOTE — Telephone Encounter (Signed)
Called patient and informed her of Dr. Vanetta Shawl recommendation below:  "I would just watch this if it became worse she may need to go to the emergency room to be checked out"   Patient verbalized understanding and had no further questions at this time.

## 2023-08-13 NOTE — Telephone Encounter (Signed)
Called patient and she reported that yesterday she had a bathroom cabinet fall on top of her. She is currently taking blood thinners and she has a bruise on her forehead and also on her left arm where her watch band usually is located. She states that there is a hard area under the bruise on her left arm and it is raised about the width of her finger. She has blood flow going to her hand and she can move all of her fingers. Please advise.

## 2023-08-24 ENCOUNTER — Ambulatory Visit: Payer: Medicare Other | Attending: Cardiology

## 2023-08-24 DIAGNOSIS — I099 Rheumatic heart disease, unspecified: Secondary | ICD-10-CM | POA: Insufficient documentation

## 2023-08-24 DIAGNOSIS — Z5181 Encounter for therapeutic drug level monitoring: Secondary | ICD-10-CM | POA: Diagnosis present

## 2023-08-24 DIAGNOSIS — I480 Paroxysmal atrial fibrillation: Secondary | ICD-10-CM | POA: Insufficient documentation

## 2023-08-24 DIAGNOSIS — Z7901 Long term (current) use of anticoagulants: Secondary | ICD-10-CM | POA: Diagnosis not present

## 2023-08-24 LAB — POCT INR: INR: 3.5 — AB (ref 2.0–3.0)

## 2023-08-24 NOTE — Patient Instructions (Signed)
Description   Continue taking 2 tablets daily except 1.5 tablets on Mondays Wednesday, and Fridays.  Stay consistent with greens each week (2-3 per week)  Recheck in 5 weeks.  Call office with any medication changes or additions [phone]

## 2023-09-28 ENCOUNTER — Ambulatory Visit: Payer: Medicare Other | Attending: Cardiology

## 2023-09-28 DIAGNOSIS — I480 Paroxysmal atrial fibrillation: Secondary | ICD-10-CM | POA: Diagnosis not present

## 2023-09-28 DIAGNOSIS — I099 Rheumatic heart disease, unspecified: Secondary | ICD-10-CM | POA: Diagnosis present

## 2023-09-28 DIAGNOSIS — Z5181 Encounter for therapeutic drug level monitoring: Secondary | ICD-10-CM | POA: Diagnosis not present

## 2023-09-28 DIAGNOSIS — Z7901 Long term (current) use of anticoagulants: Secondary | ICD-10-CM | POA: Diagnosis not present

## 2023-09-28 LAB — POCT INR: INR: 3.5 — AB (ref 2.0–3.0)

## 2023-09-28 NOTE — Patient Instructions (Signed)
Description   Continue taking 2 tablets daily except 1.5 tablets on Mondays Wednesday, and Fridays.  Stay consistent with greens each week (2-3 per week)  Recheck in 6 weeks.  Call office with any medication changes or additions [phone]

## 2023-10-10 ENCOUNTER — Other Ambulatory Visit: Payer: Self-pay | Admitting: Cardiology

## 2023-10-10 DIAGNOSIS — I480 Paroxysmal atrial fibrillation: Secondary | ICD-10-CM

## 2023-11-09 ENCOUNTER — Ambulatory Visit: Payer: Medicare Other

## 2023-11-16 ENCOUNTER — Ambulatory Visit: Payer: Medicare Other | Attending: Cardiology

## 2023-11-16 DIAGNOSIS — I480 Paroxysmal atrial fibrillation: Secondary | ICD-10-CM | POA: Insufficient documentation

## 2023-11-16 DIAGNOSIS — Z5181 Encounter for therapeutic drug level monitoring: Secondary | ICD-10-CM | POA: Insufficient documentation

## 2023-11-16 DIAGNOSIS — I099 Rheumatic heart disease, unspecified: Secondary | ICD-10-CM | POA: Diagnosis present

## 2023-11-16 DIAGNOSIS — Z7901 Long term (current) use of anticoagulants: Secondary | ICD-10-CM | POA: Diagnosis present

## 2023-11-16 LAB — POCT INR: INR: 3 (ref 2.0–3.0)

## 2023-11-16 NOTE — Patient Instructions (Signed)
 Description   Continue taking 2 tablets daily except 1.5 tablets on Mondays Wednesday, and Fridays.  Stay consistent with greens each week (2-3 per week)  Recheck in 6 weeks.  Call office with any medication changes or additions [phone]

## 2023-12-13 ENCOUNTER — Encounter: Payer: Self-pay | Admitting: Cardiology

## 2023-12-13 ENCOUNTER — Encounter: Payer: Self-pay | Admitting: *Deleted

## 2023-12-13 NOTE — Progress Notes (Unsigned)
 Cardiology Office Note:  .   Date:  12/14/2023  ID:  Melody Torres, DOB [birth year], MRN [id number] PCP: Street, Stephanie Coup, MD  [facility] Providers Cardiologist:  Gypsy Balsam, MD    History of Present Illness: .   Melody Torres is a 68 y.o. female with a past medical history of PAF s/p maze, nonobstructive CAD per left heart cath in 2020, congestive heart failure, severe mitral stenosis s/p MVR, GERD, IBS, dyslipidemia.  11/25/2022 echo EF 60 to 65%, mild mitral stenosis with Sorbid [employer]boMed[employer] valve present in the mitral position 05/24/2019 MVR with mechanical valve 33 mm Sorin [employer] 04/07/2019 left heart cath mild LAD lesion 40% stenosed, otherwise normal coronary arteries  Most recently evaluated by Dr. Bing Matter on 06/15/2023, she was stable from a cardiac perspective, had some concerns of cramping that she felt was related to her pravastatin and she was advised she could take a 2-week holiday to see if that helped.  She presents today for follow-up.  Since she was last evaluated in our office, she has been bothered by worsening fatigue, she feels it is attributed to weight gain although she has only gained a couple of pounds since she was last here.  She is still able to line dance several days per week and bowl, she has no issues completing this just feels like at times she is harder than she feels she should be.  We did discuss we could repeat echocardiogram for any changes however she does not think is warranted at this time.  Her blood pressure is slightly elevated in the office today. She denies chest pain, palpitations, dyspnea, pnd, orthopnea, n, v, dizziness, syncope, edema, weight gain, or early satiety.   ROS: Review of Systems  Constitutional:  Positive for malaise/fatigue.  Endo/Heme/Allergies:  Bruises/bleeds easily.  All other systems reviewed and are negative.    Studies Reviewed: Marland Kitchen   EKG Interpretation Date/Time:  Tuesday December 14 2023  10:04:03 EST Ventricular Rate:  90 PR Interval:  142 QRS Duration:  86 QT Interval:  384 QTC Calculation: 469 R Axis:   50  Text Interpretation: Normal sinus rhythm Nonspecific ST and T wave abnormality When compared with ECG of 28-Aug-2020 15:00, No significant change was found Confirmed by Wallis Bamberg [phone]) on 12/14/2023 10:06:55 AM    Cardiac Studies & Procedures   ______________________________________________________________________________________________ CARDIAC CATHETERIZATION  CARDIAC CATHETERIZATION 04/07/2019  Narrative Images from the original result were not included.   Hemodynamic findings consistent with mild pulmonary hypertension.  LV end diastolic pressure is low  Mid LAD lesion is 40% stenosed. Otherwise normal coronary arteries  SUMMARY  Mild Pulmonary HTN with normal LVEDP  Moderately reduced Cardiac Output / Index by FICK (Valve disease related)  With exception of mild LAD stenosis after D1, relatively normal coronary Arteries - R dominant   RECOMMENDATIONS  Discharge home after bedrest  Follow-up with Dr. Bing Matter as scheduled.  Proceed with plans for valve surgery     Bryan Lemma, M.D., M.S. Interventional Cardiologist  Pager # [phone] Phone # [phone] [address] [place], [place] [address]  Findings Coronary Findings Diagnostic  Dominance: Right  Left Main Vessel was injected. Vessel is normal in caliber. Vessel is angiographically normal.  Left Anterior Descending Vessel was injected. Vessel is normal in caliber. Vessel is angiographically normal. Mid LAD lesion is 40% stenosed. The lesion is located at the major branch, focal and eccentric.  First Diagonal Branch Vessel is moderate in size. Moderate-large caliber vessel  First Septal Branch Vessel is small in size.  Second Diagonal Branch Vessel is small in size.  Second Septal Branch Vessel is small in size.  Third Septal  Branch Vessel is small in size.  Left Circumflex Vessel was injected. Vessel is normal in caliber. Vessel is angiographically normal.  Second Obtuse Marginal Branch Small to medium caliber  Right Coronary Artery Vessel was injected. Vessel is normal in caliber. Vessel is angiographically normal.  Right Ventricular Branch Vessel is small in size.  Right Posterior Atrioventricular Artery Vessel is moderate in size.  Intervention  No interventions have been documented.     ECHOCARDIOGRAM  ECHOCARDIOGRAM COMPLETE 11/25/2022  Narrative ECHOCARDIOGRAM REPORT    Patient Name:   Melody Torres [facility] Date of Exam: 11/25/2022 Medical Rec #:  [id number]          Height:       61.5 in Accession #:    [id number]         Weight:       173.0 lb Date of Birth:  [birth year]          BSA:          1.787 m Patient Age:    66 years           BP:           120/64 mmHg Patient Gender: F                  HR:           78 bpm. Exam Location:  [place]  Procedure: 2D Echo, Cardiac Doppler, Color Doppler and Strain Analysis  Indications:    Dyspnea on exertion [R06.09 (ICD-10-CM)]  History:        Patient has prior history of Echocardiogram examinations, most recent 11/24/2021. Rheumatic heart disease and CHF, S/P Minimally invasive maze operation for atrial fibrillation, Mitral Valve Disease and S/P minimally invasive mitral valve replacement with mechanical valve; Risk Factors:Dyslipidemia.  Mitral Valve: 33 mm Sorin carbomedics valve is present in the mitral position.  Sonographer:    Louie Boston RDCS Referring Phys: [phone] Marveen Reeks KRASOWSKI  IMPRESSIONS   1. Left ventricular ejection fraction, by estimation, is 60 to 65%. The left ventricle has normal function. The left ventricle has no regional wall motion abnormalities. GLS -10.3% 2. Right ventricular systolic function is normal. The right ventricular size is normal. There is normal pulmonary artery systolic pressure. 3. The mitral  valve was not well visualized. No evidence of mitral valve regurgitation. Mild mitral stenosis. There is a 33 mm Sorin carbomedics present in the mitral position. 4. The aortic valve was not well visualized. Aortic valve regurgitation is not visualized. No aortic stenosis is present. 5. The inferior vena cava is normal in size with greater than 50% respiratory variability, suggesting right atrial pressure of 3 mmHg.  FINDINGS Left Ventricle: Left ventricular ejection fraction, by estimation, is 60 to 65%. The left ventricle has normal function. The left ventricle has no regional wall motion abnormalities. The left ventricular internal cavity size was normal in size. There is no left ventricular hypertrophy. Left ventricular diastolic parameters are consistent with Grade I diastolic dysfunction (impaired relaxation).  Right Ventricle: The right ventricular size is normal. No increase in right ventricular wall thickness. Right ventricular systolic function is normal. There is normal pulmonary artery systolic pressure. The tricuspid regurgitant velocity is 2.50 m/s, and with an assumed right atrial pressure of 3 mmHg, the estimated right ventricular systolic pressure is 28.0  mmHg.  Left Atrium: Left atrial size was normal in size.  Right Atrium: Right atrial size was normal in size.  Pericardium: There is no evidence of pericardial effusion.  Mitral Valve: The mitral valve was not well visualized. No evidence of mitral valve regurgitation. There is a 33 mm Sorin carbomedics present in the mitral position. Mild mitral valve stenosis. MV peak gradient, 10.2 mmHg. The mean mitral valve gradient is 3.5 mmHg.  Tricuspid Valve: The tricuspid valve is normal in structure. Tricuspid valve regurgitation is mild . No evidence of tricuspid stenosis.  Aortic Valve: The aortic valve was not well visualized. Aortic valve regurgitation is not visualized. No aortic stenosis is present.  Pulmonic Valve: The  pulmonic valve was normal in structure. Pulmonic valve regurgitation is not visualized. No evidence of pulmonic stenosis.  Aorta: The aortic root is normal in size and structure.  Venous: The inferior vena cava is normal in size with greater than 50% respiratory variability, suggesting right atrial pressure of 3 mmHg.  IAS/Shunts: No atrial level shunt detected by color flow Doppler.   LEFT VENTRICLE PLAX 2D LVIDd:         4.20 cm     Diastology LVIDs:         3.10 cm     LV e' medial:    4.79 cm/s LV PW:         1.20 cm     LV E/e' medial:  32.2 LV IVS:        1.30 cm     LV e' lateral:   5.64 cm/s LVOT diam:     2.00 cm     LV E/e' lateral: 27.3 LV SV:         48 LV SV Index:   27 LVOT Area:     3.14 cm  LV Volumes (MOD) LV vol d, MOD A2C: 29.0 ml LV vol d, MOD A4C: 57.9 ml LV vol s, MOD A2C: 15.5 ml LV vol s, MOD A4C: 28.9 ml LV SV MOD A2C:     13.5 ml LV SV MOD A4C:     57.9 ml LV SV MOD BP:      20.2 ml  RIGHT VENTRICLE            IVC RV S prime:     8.64 cm/s  IVC diam: 1.90 cm TAPSE (M-mode): 1.5 cm  LEFT ATRIUM             Index        RIGHT ATRIUM           Index LA diam:        4.10 cm 2.29 cm/m   RA Area:     12.00 cm LA Vol (A2C):   64.3 ml 35.99 ml/m  RA Volume:   22.90 ml  12.82 ml/m LA Vol (A4C):   43.3 ml 24.24 ml/m LA Biplane Vol: 55.4 ml 31.01 ml/m AORTIC VALVE LVOT Vmax:   75.20 cm/s LVOT Vmean:  55.100 cm/s LVOT VTI:    0.153 m  AORTA Ao Root diam: 3.40 cm Ao Asc diam:  3.40 cm Ao Desc diam: 2.40 cm  MITRAL VALVE                TRICUSPID VALVE MV Area (PHT): 2.87 cm     TR Peak grad:   25.0 mmHg MV Area VTI:   1.63 cm     TR Vmax:        250.00 cm/s MV  Peak grad:  10.2 mmHg MV Mean grad:  3.5 mmHg     SHUNTS MV Vmax:       1.60 m/s     Systemic VTI:  0.15 m MV Vmean:      83.7 cm/s    Systemic Diam: 2.00 cm MV Decel Time: 264 msec MV E velocity: 154.00 cm/s MV A velocity: 37.90 cm/s MV E/A ratio:  4.06  Belva Crome  MD Electronically signed by Belva Crome MD Signature Date/Time: 11/25/2022/12:16:40 PM    Final   TEE  ECHO INTRAOPERATIVE TEE 05/24/2019  Narrative *INTRAOPERATIVE TRANSESOPHAGEAL REPORT *    Patient Name:   Melody Torres [facility] Date of Exam: 05/24/2019 Medical Rec #:  [id number]          Height:       64.0 in Accession #:    [id number]         Weight:       140.0 lb Date of Birth:  06-20-1956          BSA:          1.68 m Patient Age:    63 years           BP:           166/92 mmHg Patient Gender: F                  HR:           54 bpm. Exam Location:  Inpatient  Transesophogeal exam was perform intraoperatively during surgical procedure. Patient was closely monitored under general anesthesia during the entirety of examination.  Indications:     Mitral Stenosis Performing Phys: [id number] CLARENCE H OWEN Diagnosing Phys: Kipp Brood MD  Complications: No known complications during this procedure. PRE-OP FINDINGS Left Ventricle: The left ventricle has normal systolic function, with an ejection fraction of 60-65%. The cavity size was normal. There is no increase in left ventricular wall thickness.  Right Ventricle: The right ventricle has normal systolic function. The cavity was mildly enlarged. There is no increase in right ventricular wall thickness. On the post-bypass exam, there was normal RV systolic functionl.  Left Atrium: Left atrial size was dilated. The left atrial cavity was markedly enlarged and measured 5.2 cm in the medio-lateral dimension and 6.8 cm in the superior-inferior dimension. There was no thrombus noted within the LA cavity or LA appendage.  On the post- bypass exam, only the base of the left atrial appendage was visible. The body of the LAA could not be visualized.  Right Atrium: Right atrial size was normal in size.  Interatrial Septum: No atrial level shunt detected by color flow Doppler. Increased thickness of the atrial septum sparing the fossa  ovalis consistent with The interatrial septum appears to be lipomatous.  Pericardium: There is no evidence of pericardial effusion.  Mitral Valve: The mitral valve is rheumatic. Severe thickening of the mitral valve leaflet. Severe calcification of the mitral valve leaflet. Mitral valve regurgitation is moderate by color flow Doppler. The MR jet is centrally-directed. There was severe mitral stenosis and moderate mitral regurgitation. The mitral leaflets were severely thickened with restricted motion characteristic of rheumatic mitral valve disease. The mitral valve area by pressure half-time method was 0.8 cm2 The peak and mean mitral inflow gradients were 19 and 8 mm hg respectively.  There was a central jet of mitral regurgitation which was due to restricted leaflet motion i.e. Type III by the Carpentier Classification.  On the post-bypass  exam, there a bi-leaflet mechanical prosthesis in the mitral postion. There were the characteristic washing jets of mitral regurgitaion which were normal appearing. There were no paravalvular leaks seen with 2D and 3D interrogation with color Doppler. The mean trans-mitral gradient was 2 mm hg.  Tricuspid Valve: The tricuspid valve was normal in structure. Tricuspid valve regurgitation is mild-moderate by color flow Doppler. The jet is directed centrally. There was mild to moderate tricuspid regurgitation with a central jet. The tricuspid annulus was normal in diameter and measured 3.6 cm in the four chamber view and 3.4 cm in the LV-inflow outflow view.  On the post-bypass exam, the tricuspid valve appeared unchanged from the pre-bypass study.  Aortic Valve: The aortic valve is tricuspid There is Mild thickening of the aortic valve Aortic valve regurgitation was not visualized by color flow Doppler. There is no evidence of aortic valve stenosis. There is no evidence of a vegetation on the aortic valve.  Pulmonic Valve: The pulmonic valve was normal in  structure, with normal. No evidence of pumonic stenosis. Pulmonic valve regurgitation is trivial by color flow Doppler.   +-------------+--------++ MITRAL VALVE          +-------------+--------++ MV Mean grad:8.0 mmHg +-------------+--------++   Kipp Brood MD Electronically signed by Kipp Brood MD Signature Date/Time: 05/24/2019/4:44:16 PM    Final  MONITORS  LONG TERM MONITOR (3-14 DAYS) 09/27/2019  Narrative Melody Torres, DOB [birth year], MRN [id number]  HOLTER MONITOR REPORT:    Date of test:                 09/12/2019 Duration of test:           6 days Indication:                    Paroxysmal atrial fibrillation Ordering physician:  Georgeanna Lea, MD Referring physician:  Georgeanna Lea, MD   Baseline rhythm: Sinus rhythm  Minimum heart rate: 62 BPM.  Average heart rate: 85 BPM.  Maximal heart rate 2018 BPM.  Atrial arrhythmia: Multiple episode of narrow complex tachycardia, fastest episodes 5 beats at rate of 218, longest episode 9 beats at rate of 144.  No clear-cut atrial fibrillation noted.  Ventricular arrhythmia: Infrequent PVCs noted with some trigeminal cycling.  Conduction abnormality: None  Symptoms: None   Conclusion: Few episodes narrow complex tachycardia. No symptoms entered into the diary  Interpreting  cardiologist: Gypsy Balsam, MD Date: 10/10/2019 5:15 PM       ______________________________________________________________________________________________      Risk Assessment/Calculations:    CHA2DS2-VASc Score = 4   This indicates a 4.8% annual risk of stroke. The patient's score is based upon: CHF History: 1 HTN History: 0 Diabetes History: 0 Stroke History: 0 Vascular Disease History: 1 Age Score: 1 Gender Score: 1         Physical Exam:   VS:  BP (!) 150/70 (BP Location: Left Arm, Patient Position: Sitting, Cuff Size: Normal)   Pulse 90   Ht 5\' 1"  (1.549 m)   Wt 178 lb (80.7 kg)    LMP  (LMP Unknown)   SpO2 97%   BMI 33.63 kg/m    Wt Readings from Last 3 Encounters:  12/14/23 178 lb (80.7 kg)  06/15/23 175 lb (79.4 kg)  11/24/22 173 lb (78.5 kg)    GEN: Well nourished, well developed in no acute distress NECK: No JVD; No carotid bruits CARDIAC: RRR, mechanical valve click  RESPIRATORY:  Clear to auscultation without  rales, wheezing or rhonchi  ABDOMEN: Soft, non-tender, non-distended EXTREMITIES:  No edema; No deformity   ASSESSMENT AND PLAN: .   Severe mitral stenosis s/p MVR-most recent echo in 2024 revealed mild mitral stenosis with the valve well-seated.  Currently on Coumadin, she is also on aspirin secondary to rheumatic disease that preceded her MVR, follow-up with the Coumadin clinic.  Atrial fibrillation - s/p MAZE procedure. No recurrent episodes of atrial fibrillation. Anticoagulated on Coumadin.   Dyslipidemia-currently on Zetia 10 mg daily.  Elevated blood pressure reading-elevated today 150/70 however typically well-controlled, will have her keep a BP log.        Dispo: Blood pressure log for 2 weeks, follow-up in 6 months.  Signed, Flossie Dibble, NP

## 2023-12-14 ENCOUNTER — Encounter: Payer: Self-pay | Admitting: Cardiology

## 2023-12-14 ENCOUNTER — Ambulatory Visit (INDEPENDENT_AMBULATORY_CARE_PROVIDER_SITE_OTHER): Payer: Medicare Other

## 2023-12-14 ENCOUNTER — Ambulatory Visit: Payer: Medicare Other | Attending: Cardiology | Admitting: Cardiology

## 2023-12-14 VITALS — BP 150/70 | HR 90 | Ht 61.0 in | Wt 178.0 lb

## 2023-12-14 DIAGNOSIS — Z7901 Long term (current) use of anticoagulants: Secondary | ICD-10-CM

## 2023-12-14 DIAGNOSIS — I480 Paroxysmal atrial fibrillation: Secondary | ICD-10-CM

## 2023-12-14 DIAGNOSIS — Z8679 Personal history of other diseases of the circulatory system: Secondary | ICD-10-CM | POA: Diagnosis present

## 2023-12-14 DIAGNOSIS — Z5181 Encounter for therapeutic drug level monitoring: Secondary | ICD-10-CM

## 2023-12-14 DIAGNOSIS — I099 Rheumatic heart disease, unspecified: Secondary | ICD-10-CM

## 2023-12-14 DIAGNOSIS — I5032 Chronic diastolic (congestive) heart failure: Secondary | ICD-10-CM | POA: Diagnosis present

## 2023-12-14 DIAGNOSIS — Z9889 Other specified postprocedural states: Secondary | ICD-10-CM

## 2023-12-14 DIAGNOSIS — I059 Rheumatic mitral valve disease, unspecified: Secondary | ICD-10-CM | POA: Diagnosis present

## 2023-12-14 LAB — POCT INR: INR: 3.2 — AB (ref 2.0–3.0)

## 2023-12-14 NOTE — Patient Instructions (Signed)
 Please keep a blood pressure log for two weeks. You may upload the log to MyChart once complete, or drop off the log to our office.    Medication Instructions:  Your physician recommends that you continue on your current medications as directed. Please refer to the Current Medication list given to you today.  *If you need a refill on your cardiac medications before your next appointment, please call your pharmacy*   Lab Work: None Ordered If you have labs (blood work) drawn today and your tests are completely normal, you will receive your results only by: MyChart Message (if you have MyChart) OR A paper copy in the mail If you have any lab test that is abnormal or we need to change your treatment, we will call you to review the results.   Testing/Procedures: None Ordered   Follow-Up: At [facility], you and your health needs are our priority.  As part of our continuing mission to provide you with exceptional heart care, we have created designated Provider Care Teams.  These Care Teams include your primary Cardiologist (physician) and Advanced Practice Providers (APPs -  Physician Assistants and Nurse Practitioners) who all work together to provide you with the care you need, when you need it.  We recommend signing up for the patient portal called "MyChart".  Sign up information is provided on this After Visit Summary.  MyChart is used to connect with patients for Virtual Visits (Telemedicine).  Patients are able to view lab/test results, encounter notes, upcoming appointments, etc.  Non-urgent messages can be sent to your provider as well.   To learn more about what you can do with MyChart, go to [email or web address].    Your next appointment:   6 month follow up with Dr Bing Matter

## 2023-12-14 NOTE — Patient Instructions (Signed)
 Description   Continue taking 2 tablets daily except 1.5 tablets on Mondays Wednesday, and Fridays.  Stay consistent with greens each week (2-3 per week)  Recheck in 6 weeks.  Call office with any medication changes or additions [phone]

## 2024-01-06 ENCOUNTER — Other Ambulatory Visit: Payer: Self-pay | Admitting: Cardiology

## 2024-01-06 DIAGNOSIS — I480 Paroxysmal atrial fibrillation: Secondary | ICD-10-CM

## 2024-01-06 NOTE — Telephone Encounter (Signed)
 Prescription refill request received for warfarin Lov: Woody 12/14/2023 Next INR check: 4/1 Warfarin tablet strength: 5mg 

## 2024-01-25 ENCOUNTER — Ambulatory Visit: Payer: Medicare Other | Attending: Cardiology

## 2024-01-25 DIAGNOSIS — Z7901 Long term (current) use of anticoagulants: Secondary | ICD-10-CM

## 2024-01-25 DIAGNOSIS — I099 Rheumatic heart disease, unspecified: Secondary | ICD-10-CM | POA: Diagnosis present

## 2024-01-25 DIAGNOSIS — Z5181 Encounter for therapeutic drug level monitoring: Secondary | ICD-10-CM

## 2024-01-25 DIAGNOSIS — I480 Paroxysmal atrial fibrillation: Secondary | ICD-10-CM

## 2024-01-25 LAB — POCT INR: INR: 4 — AB (ref 2.0–3.0)

## 2024-01-25 NOTE — Patient Instructions (Signed)
 Description   HOLD today's dose and then continue taking 2 tablets daily except 1.5 tablets on Mondays Wednesday, and Fridays.  Stay consistent with greens each week (2-3 per week)  Recheck in 4 weeks.  Call office with any medication changes or additions [phone]

## 2024-01-29 ENCOUNTER — Telehealth: Payer: Self-pay | Admitting: Cardiology

## 2024-01-31 NOTE — Telephone Encounter (Signed)
 Called patient and reviewed Melody Torres woody's note with patient. She verbalized understanding and had no further questions/

## 2024-02-14 ENCOUNTER — Encounter: Payer: Self-pay | Admitting: Cardiology

## 2024-02-14 NOTE — Telephone Encounter (Signed)
 Called and spoke with pt. Pt was prescribed Cifdinir x 10 days. Made pt aware this mediation should not interact with Warfarin. Pt verbalized understanding.

## 2024-02-22 ENCOUNTER — Ambulatory Visit

## 2024-02-29 ENCOUNTER — Ambulatory Visit: Attending: Cardiology

## 2024-02-29 DIAGNOSIS — Z5181 Encounter for therapeutic drug level monitoring: Secondary | ICD-10-CM | POA: Diagnosis present

## 2024-02-29 DIAGNOSIS — I480 Paroxysmal atrial fibrillation: Secondary | ICD-10-CM | POA: Diagnosis present

## 2024-02-29 DIAGNOSIS — I099 Rheumatic heart disease, unspecified: Secondary | ICD-10-CM | POA: Diagnosis present

## 2024-02-29 DIAGNOSIS — Z7901 Long term (current) use of anticoagulants: Secondary | ICD-10-CM | POA: Diagnosis present

## 2024-02-29 LAB — POCT INR: INR: 4 — AB (ref 2.0–3.0)

## 2024-02-29 NOTE — Patient Instructions (Signed)
 Description   HOLD today's dose and then continue taking 2 tablets daily except 1.5 tablets on Mondays Wednesday, and Fridays.  Stay consistent with greens each week (2-3 per week)  Recheck in 3 weeks.  Call office with any medication changes or additions [phone]

## 2024-03-21 ENCOUNTER — Ambulatory Visit: Attending: Cardiology

## 2024-03-21 DIAGNOSIS — I480 Paroxysmal atrial fibrillation: Secondary | ICD-10-CM | POA: Insufficient documentation

## 2024-03-21 DIAGNOSIS — Z5181 Encounter for therapeutic drug level monitoring: Secondary | ICD-10-CM | POA: Insufficient documentation

## 2024-03-21 DIAGNOSIS — I099 Rheumatic heart disease, unspecified: Secondary | ICD-10-CM | POA: Insufficient documentation

## 2024-03-21 DIAGNOSIS — Z7901 Long term (current) use of anticoagulants: Secondary | ICD-10-CM | POA: Insufficient documentation

## 2024-03-21 LAB — POCT INR: INR: 4.1 — AB (ref 2.0–3.0)

## 2024-03-21 NOTE — Patient Instructions (Signed)
 Description   HOLD today's dose and then START taking 2 tablets daily except 1.5 tablets on Sunday, Mondays Wednesday, and Fridays.  Stay consistent with greens each week (2-3 per week)  Recheck in 3 weeks.  Call office with any medication changes or additions [phone]

## 2024-04-06 ENCOUNTER — Other Ambulatory Visit: Payer: Self-pay | Admitting: Cardiology

## 2024-04-06 DIAGNOSIS — I480 Paroxysmal atrial fibrillation: Secondary | ICD-10-CM

## 2024-04-06 NOTE — Telephone Encounter (Signed)
 Prescription refill request received for warfarin Lov: 12/24/2023 Next INR check: 6/17 Warfarin tablet strength: 5mg 

## 2024-04-11 ENCOUNTER — Ambulatory Visit: Attending: Cardiology

## 2024-04-11 DIAGNOSIS — I099 Rheumatic heart disease, unspecified: Secondary | ICD-10-CM | POA: Diagnosis present

## 2024-04-11 DIAGNOSIS — Z7901 Long term (current) use of anticoagulants: Secondary | ICD-10-CM | POA: Diagnosis present

## 2024-04-11 DIAGNOSIS — I480 Paroxysmal atrial fibrillation: Secondary | ICD-10-CM | POA: Insufficient documentation

## 2024-04-11 DIAGNOSIS — Z5181 Encounter for therapeutic drug level monitoring: Secondary | ICD-10-CM | POA: Insufficient documentation

## 2024-04-11 LAB — POCT INR: INR: 2.4 (ref 2.0–3.0)

## 2024-04-11 NOTE — Patient Instructions (Signed)
 Description   Take 2.5 tablets today and then resume taking 2 tablets daily except 1.5 tablets on Sunday, Mondays Wednesday, and Fridays.  Stay consistent with greens each week (2-3 per week)  Recheck in 3 weeks.  Call office with any medication changes or additions [phone]

## 2024-05-02 ENCOUNTER — Ambulatory Visit: Attending: Cardiology

## 2024-05-02 DIAGNOSIS — Z5181 Encounter for therapeutic drug level monitoring: Secondary | ICD-10-CM | POA: Diagnosis present

## 2024-05-02 DIAGNOSIS — I480 Paroxysmal atrial fibrillation: Secondary | ICD-10-CM | POA: Insufficient documentation

## 2024-05-02 DIAGNOSIS — Z7901 Long term (current) use of anticoagulants: Secondary | ICD-10-CM | POA: Insufficient documentation

## 2024-05-02 DIAGNOSIS — I099 Rheumatic heart disease, unspecified: Secondary | ICD-10-CM | POA: Diagnosis present

## 2024-05-02 LAB — POCT INR: INR: 2.8 (ref 2.0–3.0)

## 2024-05-02 NOTE — Patient Instructions (Signed)
 Description   Continue taking 2 tablets daily except 1.5 tablets on Sunday, Mondays Wednesday, and Fridays.  Stay consistent with greens each week (2-3 per week)  Recheck in 4 weeks.  Call office with any medication changes or additions [phone]

## 2024-05-02 NOTE — Progress Notes (Signed)
Please see anticoagulation encounter.

## 2024-05-30 ENCOUNTER — Ambulatory Visit: Attending: Cardiology

## 2024-05-30 DIAGNOSIS — Z7901 Long term (current) use of anticoagulants: Secondary | ICD-10-CM | POA: Diagnosis present

## 2024-05-30 DIAGNOSIS — Z5181 Encounter for therapeutic drug level monitoring: Secondary | ICD-10-CM | POA: Insufficient documentation

## 2024-05-30 DIAGNOSIS — I099 Rheumatic heart disease, unspecified: Secondary | ICD-10-CM | POA: Insufficient documentation

## 2024-05-30 DIAGNOSIS — I480 Paroxysmal atrial fibrillation: Secondary | ICD-10-CM | POA: Diagnosis present

## 2024-05-30 LAB — POCT INR: INR: 3.7 — AB (ref 2.0–3.0)

## 2024-05-30 NOTE — Patient Instructions (Signed)
 Description   HOLD today's dose and then Continue taking 2 tablets daily except 1.5 tablets on Sunday, Mondays Wednesday, and Fridays.  Stay consistent with greens each week (2-3 per week)  Recheck in 3 weeks.  Call office with any medication changes or additions [phone]

## 2024-05-30 NOTE — Progress Notes (Signed)
 INR 3.7; Please see anticoagulation encounter

## 2024-06-20 ENCOUNTER — Ambulatory Visit: Attending: Cardiology | Admitting: Cardiology

## 2024-06-20 ENCOUNTER — Ambulatory Visit (INDEPENDENT_AMBULATORY_CARE_PROVIDER_SITE_OTHER)

## 2024-06-20 VITALS — BP 122/78 | HR 79 | Ht 61.0 in | Wt 176.0 lb

## 2024-06-20 DIAGNOSIS — I099 Rheumatic heart disease, unspecified: Secondary | ICD-10-CM | POA: Insufficient documentation

## 2024-06-20 DIAGNOSIS — I059 Rheumatic mitral valve disease, unspecified: Secondary | ICD-10-CM | POA: Insufficient documentation

## 2024-06-20 DIAGNOSIS — Z5181 Encounter for therapeutic drug level monitoring: Secondary | ICD-10-CM

## 2024-06-20 DIAGNOSIS — Z7901 Long term (current) use of anticoagulants: Secondary | ICD-10-CM

## 2024-06-20 DIAGNOSIS — I480 Paroxysmal atrial fibrillation: Secondary | ICD-10-CM | POA: Insufficient documentation

## 2024-06-20 DIAGNOSIS — E7800 Pure hypercholesterolemia, unspecified: Secondary | ICD-10-CM | POA: Insufficient documentation

## 2024-06-20 DIAGNOSIS — Z9889 Other specified postprocedural states: Secondary | ICD-10-CM | POA: Diagnosis present

## 2024-06-20 DIAGNOSIS — R0609 Other forms of dyspnea: Secondary | ICD-10-CM | POA: Insufficient documentation

## 2024-06-20 DIAGNOSIS — Z954 Presence of other heart-valve replacement: Secondary | ICD-10-CM | POA: Insufficient documentation

## 2024-06-20 DIAGNOSIS — Z8679 Personal history of other diseases of the circulatory system: Secondary | ICD-10-CM | POA: Insufficient documentation

## 2024-06-20 LAB — POCT INR: INR: 4.4 — AB (ref 2.0–3.0)

## 2024-06-20 NOTE — Progress Notes (Signed)
 Cardiology Office Note:    Date:  06/20/2024   ID:  Melody Torres, DOB 30-Jul-1956, MRN [id number]  PCP:  Street, Melody HERO, MD  Cardiologist:  Lamar Fitch, MD    Referring MD: [address] Melody Torres, *   No chief complaint on file.   History of Present Illness:    Melody Torres is a 68 y.o. female past medical history significant for rheumatic fever she did have severe mitral stenosis on 05/24/2019 she did have minimally invasive mitral valve replacement done with mechanical valve Sorin CarboMedics 33 mm she also got Maze procedure done that time with left atrial appendage clipping.  Comes today to my office for follow-up overall doing well.  Last time she seen my nurse petitioner she was complaining of being weak tired exhausted however she started doing aerobics 4 times a week that make her feel much better overall doing well now.  No chest pain tightness squeezing pressure burning chest  Past Medical History:  Diagnosis Date   Anticoagulation adequate with anticoagulant therapy 01/20/2016   Anxiety and depression    CAD (coronary artery disease) 2020   Mild nonobstructive with LAD lesion up to 40% stenosed   CHF (congestive heart failure) (HCC) 2017   Chronic anticoagulation    Compression fracture of thoracic spine, non-traumatic (HCC) 09/08/2019   Encounter for screening colonoscopy    Encounter for therapeutic drug monitoring 04/30/2017   GERD (gastroesophageal reflux disease)    Hypercholesterolemia 12/05/2018   Hyperlipidemia    Hyperlipidemia    no meds, diet controlled   IBS (irritable bowel syndrome)    Long term (current) use of anticoagulants [Z79.01] 04/30/2017   Mitral valve disease, rheumatic 12/05/2018   Formatting of this note might be different from the original. TTE 12/19: Left ventricle: The cavity size was normal. There was mild   concentric hypertrophy. Systolic function was normal. The   estimated ejection fraction was in the range of 55%  to 60%. Wall   motion was normal; there were no regional wall motion   abnormalities. - Aortic valve: Valve area (VTI): 1.67 cm^2. Valve area (Vmax):   1.   Obesity with body mass index 30 or greater 11/14/2019   Osteoporosis    Pain in thoracic spine 08/22/2019   Paroxysmal atrial fibrillation (HCC) 01/20/2016   Overview:  Chads score 2 anticoagulated with warfarin   Positive colorectal cancer screening using Cologuard test    Rheumatic heart disease    Rheumatic mitral regurgitation 01/20/2016   Overview:  Mild to moderate   S/P Minimally invasive maze operation for atrial fibrillation 05/24/2019   Complete bilateral atrial lesion set using cryothermy and bipolar radiofrequency ablation with clipping of LA appendage via right mini-thoracotomy approach   S/P minimally invasive mitral valve replacement with mechanical valve 05/24/2019   33 mm Sorin Carbomedics bileaflet mechanical valve via right mini thoracotomy approach   Severe mitral valve stenosis 01/20/2016   Tricuspid regurgitation     Past Surgical History:  Procedure Laterality Date   CLIPPING OF ATRIAL APPENDAGE N/A 05/24/2019   Procedure: Clipping Of Atrial Appendage Using AtriCure PRO2 clip size 45mm;  Surgeon: Dusty Sudie DEL, MD;  Location: [facility] OR;  Service: Open Heart Surgery;  Laterality: N/A;   COLONOSCOPY  06/14/2008   Mild sigmoid diverticulosis. Otherwise normal colonoscopy   COLONOSCOPY WITH PROPOFOL  N/A 06/17/2020   Procedure: COLONOSCOPY WITH PROPOFOL ;  Surgeon: Charlanne Groom, MD;  Location: [facility] ENDOSCOPY;  Service: Endoscopy;  Laterality: N/A;   ESOPHAGOGASTRODUODENOSCOPY  05/18/2011   Schatzki ring status post esophageal dilatation. Small hiatal hernia. Incidential gastic polyps (status post polypectomy x2). Duodenal ulcers.    MINIMALLY INVASIVE MAZE PROCEDURE N/A 05/24/2019   Procedure: MINIMALLY INVASIVE MAZE PROCEDURE;  Surgeon: Dusty Sudie DEL, MD;  Location: [facility] OR;  Service: Open Heart Surgery;  Laterality:  N/A;   MITRAL VALVE REPLACEMENT Right 05/24/2019   Procedure: MINIMALLY INVASIVE MITRAL VALVE (MV) REPLACEMENT Using Carbomedics Optiform Heart Valve Size 33mm;  Surgeon: Dusty Sudie DEL, MD;  Location: [facility] OR;  Service: Open Heart Surgery;  Laterality: Right;   RIGHT/LEFT HEART CATH AND CORONARY ANGIOGRAPHY N/A 04/07/2019   Procedure: RIGHT/LEFT HEART CATH AND CORONARY ANGIOGRAPHY;  Surgeon: Anner Alm ORN, MD;  Location: [facility] INVASIVE CV LAB;  Service: Cardiovascular;  Laterality: N/A;   TEE WITHOUT CARDIOVERSION N/A 01/06/2019   Procedure: TRANSESOPHAGEAL ECHOCARDIOGRAM (TEE);  Surgeon: Maranda Leim DEL, MD;  Location: [facility] ENDOSCOPY;  Service: Cardiovascular;  Laterality: N/A;   TEE WITHOUT CARDIOVERSION N/A 05/24/2019   Procedure: TRANSESOPHAGEAL ECHOCARDIOGRAM (TEE);  Surgeon: Dusty Sudie DEL, MD;  Location: [facility] OR;  Service: Open Heart Surgery;  Laterality: N/A;   WISDOM TOOTH EXTRACTION      Current Medications: Current Meds  Medication Sig   acetaminophen  (TYLENOL ) 500 MG tablet Take 500 mg by mouth as needed for moderate pain or headache.    aspirin  EC 81 MG EC tablet Take 1 tablet (81 mg total) by mouth daily.   clobetasol cream (TEMOVATE) 0.05 % Apply 1 application topically 2 (two) times daily as needed (for lichen sclerosus).    loratadine (CLARITIN) 10 MG tablet Take 10 mg by mouth every other day.    Multiple Vitamin (MULTIVITAMIN) tablet Take 1 tablet by mouth daily. Unknown strenght   UNABLE TO FIND Take 600 mg by mouth daily. Med Name: Chromate Pure Vitamin/ Unknown strength   warfarin (COUMADIN ) 5 MG tablet TAKE 1.5 TO 2 TABLETS BY MOUTH DAILY AS DIRECTED BY THE COUMADIN  CLINIC     Allergies:   Adhesive [tape], Pravastatin  sodium, Rosuvastatin , and Penicillins   Social History   Socioeconomic History   Marital status: Widowed    Spouse name: Not on file   Number of children: Not on file   Years of education: Not on file   Highest education level: Not on file   Occupational History   Not on file  Tobacco Use   Smoking status: Never    Passive exposure: Past   Smokeless tobacco: Never  Vaping Use   Vaping status: Never Used  Substance and Sexual Activity   Alcohol use: No   Drug use: No   Sexual activity: Not on file  Other Topics Concern   Not on file  Social History Narrative   Not on file   Social Drivers of Health   Financial Resource Strain: Not on file  Food Insecurity: Not on file  Transportation Needs: Not on file  Physical Activity: Not on file  Stress: Not on file  Social Connections: Not on file     Family History: The patient's family history includes Bladder Cancer in her mother; Diabetes in her father; Heart attack in her mother; Heart disease in her father; Hypothyroidism in her mother; Sjogren's syndrome in her daughter; Stroke in her father. There is no history of Colon cancer, Esophageal cancer, Stomach cancer, or Rectal cancer. ROS:   Please see the history of present illness.    All 14 point review of systems negative except as described per history of  present illness  EKGs/Labs/Other Studies Reviewed:         Recent Labs: No results found for requested labs within last 365 days.  Recent Lipid Panel    Component Value Date/Time   CHOL 244 (H) 12/09/2021 1019   TRIG 197 (H) 12/09/2021 1019   HDL 42 12/09/2021 1019   CHOLHDL 5.8 (H) 12/09/2021 1019   LDLCALC 165 (H) 12/09/2021 1019    Physical Exam:    VS:  BP 122/78   Pulse 79   Ht 5' 1 (1.549 m)   Wt 176 lb (79.8 kg)   LMP  (LMP Unknown)   SpO2 98%   BMI 33.25 kg/m     Wt Readings from Last 3 Encounters:  06/20/24 176 lb (79.8 kg)  12/14/23 178 lb (80.7 kg)  06/15/23 175 lb (79.4 kg)     GEN:  Well nourished, well developed in no acute distress HEENT: Normal NECK: No JVD; No carotid bruits LYMPHATICS: No lymphadenopathy CARDIAC: RRR, crisp mechanical valve sounds.  No murmurs, no rubs, no gallops RESPIRATORY:  Clear to  auscultation without rales, wheezing or rhonchi  ABDOMEN: Soft, non-tender, non-distended MUSCULOSKELETAL:  No edema; No deformity  SKIN: Warm and dry LOWER EXTREMITIES: no swelling NEUROLOGIC:  Alert and oriented x 3 PSYCHIATRIC:  Normal affect   ASSESSMENT:    1. Paroxysmal atrial fibrillation (HCC)   2. Mitral valve disease, rheumatic   3. S/P Minimally invasive maze operation for atrial fibrillation   4. S/P minimally invasive mitral valve replacement with mechanical valve     PLAN:    In order of problems listed above:  Paroxysmal atrial fibrillation.  Doing well from that point review continue anticoagulation. Mitral valve disease status post mitral valve replacement.  Will do echocardiogram to assess the valve. Weakness fatigue and tiredness overall better but again we will do echocardiogram to check the valve. Dyslipidemia she stopped Zetia  she thinks that it gave her hip pain.  Will recheck fasting lipid profile in about 3 weeks and then decide what to do next   Medication Adjustments/Labs and Tests Ordered: Current medicines are reviewed at length with the patient today.  Concerns regarding medicines are outlined above.  No orders of the defined types were placed in this encounter.  Medication changes: No orders of the defined types were placed in this encounter.   Signed, Lamar DOROTHA Fitch, MD, [facility] 06/20/2024 9:48 AM    [facility]

## 2024-06-20 NOTE — Patient Instructions (Signed)
 HOLD today's dose and then Continue taking 2 tablets daily except 1.5 tablets on Sunday, Mondays Wednesday, and Fridays.  Stay consistent with greens each week (2-3 per week)  Recheck in 3 weeks.  Call office with any medication changes or additions [phone]

## 2024-06-20 NOTE — Patient Instructions (Signed)
 Medication Instructions:  Your physician recommends that you continue on your current medications as directed. Please refer to the Current Medication list given to you today.  *If you need a refill on your cardiac medications before your next appointment, please call your pharmacy*   Lab Work: Your physician recommends that you return for lab work in: when fasting You need to have labs done when you are fasting.  You can come Monday through Friday 8:30 am to 12:00 pm and 1:15 to 4:30. You do not need to make an appointment as the order has already been placed. The labs you are going to have done are AST, ALT Lipids.    Testing/Procedures: Your physician has requested that you have an echocardiogram. Echocardiography is a painless test that uses sound waves to create images of your heart. It provides your doctor with information about the size and shape of your heart and how well your heart's chambers and valves are working. This procedure takes approximately one hour. There are no restrictions for this procedure. Please do NOT wear cologne, perfume, aftershave, or lotions (deodorant is allowed). Please arrive 15 minutes prior to your appointment time.  Please note: We ask at that you not bring children with you during ultrasound (echo/ vascular) testing. Due to room size and safety concerns, children are not allowed in the ultrasound rooms during exams. Our front office staff cannot provide observation of children in our lobby area while testing is being conducted. An adult accompanying a patient to their appointment will only be allowed in the ultrasound room at the discretion of the ultrasound technician under special circumstances. We apologize for any inconvenience.    Follow-Up: At [facility], you and your health needs are our priority.  As part of our continuing mission to provide you with exceptional heart care, we have created designated Provider Care Teams.  These Care Teams include  your primary Cardiologist (physician) and Advanced Practice Providers (APPs -  Physician Assistants and Nurse Practitioners) who all work together to provide you with the care you need, when you need it.  We recommend signing up for the patient portal called "MyChart".  Sign up information is provided on this After Visit Summary.  MyChart is used to connect with patients for Virtual Visits (Telemedicine).  Patients are able to view lab/test results, encounter notes, upcoming appointments, etc.  Non-urgent messages can be sent to your provider as well.   To learn more about what you can do with MyChart, go to [email or web address].    Your next appointment:   6 month(s)  The format for your next appointment:   In Person  Provider:   Gypsy Balsam, MD    Other Instructions NA

## 2024-07-02 ENCOUNTER — Other Ambulatory Visit: Payer: Self-pay | Admitting: Cardiology

## 2024-07-02 DIAGNOSIS — I480 Paroxysmal atrial fibrillation: Secondary | ICD-10-CM

## 2024-07-06 MED ORDER — EZETIMIBE 10 MG PO TABS: 10.0000 mg | ORAL_TABLET | Freq: Every day | ORAL | 3 refills | Status: AC

## 2024-07-06 MED ORDER — WARFARIN SODIUM 5 MG PO TABS: ORAL_TABLET | ORAL | 0 refills | Status: AC

## 2024-07-11 ENCOUNTER — Ambulatory Visit: Attending: Cardiology

## 2024-07-11 DIAGNOSIS — Z5181 Encounter for therapeutic drug level monitoring: Secondary | ICD-10-CM | POA: Diagnosis present

## 2024-07-11 DIAGNOSIS — I099 Rheumatic heart disease, unspecified: Secondary | ICD-10-CM | POA: Diagnosis present

## 2024-07-11 DIAGNOSIS — Z7901 Long term (current) use of anticoagulants: Secondary | ICD-10-CM | POA: Diagnosis present

## 2024-07-11 DIAGNOSIS — I480 Paroxysmal atrial fibrillation: Secondary | ICD-10-CM | POA: Diagnosis present

## 2024-07-11 LAB — POCT INR: INR: 4.5 — AB (ref 2.0–3.0)

## 2024-07-11 NOTE — Patient Instructions (Signed)
 HOLD today's dose and then Decrease to 1.5 tablets Daily, except 2 tablets every Tuesday and Thursday. Stay consistent with greens each week (2-3 per week)  Recheck in 3 weeks.  Call office with any medication changes or additions [phone]

## 2024-07-12 LAB — LIPID PANEL
Chol/HDL Ratio: 6.7 ratio — ABNORMAL HIGH (ref 0.0–4.4)
Cholesterol, Total: 233 mg/dL — ABNORMAL HIGH (ref 100–199)
HDL: 35 mg/dL — ABNORMAL LOW (ref >39)
LDL Chol Calc (NIH): 153 mg/dL — ABNORMAL HIGH (ref 0–99)
Triglycerides: 245 mg/dL — ABNORMAL HIGH (ref 0–149)
VLDL Cholesterol Cal: 45 mg/dL — ABNORMAL HIGH (ref 5–40)

## 2024-07-12 LAB — AST: AST: 33 IU/L (ref 0–40)

## 2024-07-12 LAB — ALT: ALT: 36 IU/L — ABNORMAL HIGH (ref 0–32)

## 2024-07-13 ENCOUNTER — Other Ambulatory Visit

## 2024-07-13 ENCOUNTER — Ambulatory Visit: Payer: Self-pay | Admitting: Cardiology

## 2024-07-13 DIAGNOSIS — E782 Mixed hyperlipidemia: Secondary | ICD-10-CM

## 2024-07-13 DIAGNOSIS — E7800 Pure hypercholesterolemia, unspecified: Secondary | ICD-10-CM

## 2024-07-18 ENCOUNTER — Telehealth: Payer: Self-pay | Admitting: Cardiology

## 2024-07-18 NOTE — Telephone Encounter (Signed)
 Pt c/o medication issue:  1. Name of Medication: Paxlovid  2. How are you currently taking this medication (dosage and times per day)?   3. Are you having a reaction (difficulty breathing--STAT)? No  4. What is your medication issue? Pt states that she is unable to take the Paxlovid that Dr. Bernie prescribed to her due to her heart conditions and would like to know if there is any other COVID medication Dr. K can prescribe her that will not interfere with her heart conditions

## 2024-07-18 NOTE — Telephone Encounter (Signed)
 Spoke with pt. She was prescribed Paxlovid for Covid and is not able to take it due to her heart issues. Advised to call the prescriber back to get something else and then double check with the pharmacy.

## 2024-07-24 MED ORDER — NEXLETOL 180 MG PO TABS: 1.0000 | ORAL_TABLET | Freq: Every day | ORAL | Status: AC

## 2024-07-25 ENCOUNTER — Other Ambulatory Visit

## 2024-08-01 ENCOUNTER — Ambulatory Visit: Attending: Cardiology

## 2024-08-01 DIAGNOSIS — I099 Rheumatic heart disease, unspecified: Secondary | ICD-10-CM | POA: Insufficient documentation

## 2024-08-01 DIAGNOSIS — I480 Paroxysmal atrial fibrillation: Secondary | ICD-10-CM | POA: Insufficient documentation

## 2024-08-01 DIAGNOSIS — Z5181 Encounter for therapeutic drug level monitoring: Secondary | ICD-10-CM | POA: Diagnosis present

## 2024-08-01 DIAGNOSIS — Z7901 Long term (current) use of anticoagulants: Secondary | ICD-10-CM | POA: Insufficient documentation

## 2024-08-01 LAB — POCT INR: INR: 2.7 (ref 2.0–3.0)

## 2024-08-01 NOTE — Patient Instructions (Signed)
 Continue taking 1.5 tablets Daily, except 2 tablets every Tuesday and Thursday. Stay consistent with greens each week (2-3 per week) Eat greens every other day while on Doxycycline. Recheck in 4 weeks.  Call office with any medication changes or additions [phone]

## 2024-08-08 ENCOUNTER — Ambulatory Visit: Attending: Cardiology

## 2024-08-08 DIAGNOSIS — Z5181 Encounter for therapeutic drug level monitoring: Secondary | ICD-10-CM | POA: Diagnosis present

## 2024-08-08 DIAGNOSIS — Z7901 Long term (current) use of anticoagulants: Secondary | ICD-10-CM | POA: Insufficient documentation

## 2024-08-08 DIAGNOSIS — I099 Rheumatic heart disease, unspecified: Secondary | ICD-10-CM | POA: Diagnosis present

## 2024-08-08 DIAGNOSIS — I480 Paroxysmal atrial fibrillation: Secondary | ICD-10-CM | POA: Insufficient documentation

## 2024-08-08 LAB — POCT INR: INR: 3 (ref 2.0–3.0)

## 2024-08-08 NOTE — Patient Instructions (Signed)
 Continue taking 1.5 tablets Daily, except 2 tablets every Tuesday and Thursday. Eat greens Wednesday and Thursday while finishing Prednisone. Recheck in 6 weeks.  Call office with any medication changes or additions [phone]

## 2024-08-09 ENCOUNTER — Ambulatory Visit: Attending: Cardiology

## 2024-08-09 DIAGNOSIS — R0609 Other forms of dyspnea: Secondary | ICD-10-CM | POA: Insufficient documentation

## 2024-08-09 LAB — ECHOCARDIOGRAM COMPLETE
AR max vel: 1.38 cm2
AV Area VTI: 1.06 cm2
AV Area mean vel: 1.27 cm2
AV Mean grad: 6 mmHg
AV Peak grad: 11.2 mmHg
Ao pk vel: 1.67 m/s
Area-P 1/2: 3.46 cm2
MV VTI: 0.81 cm2
S' Lateral: 2.3 cm

## 2024-08-28 ENCOUNTER — Telehealth: Payer: Self-pay

## 2024-08-28 NOTE — Telephone Encounter (Signed)
 Left message on My Chart with Echo results per Dr. Karry note. Routed to PCP.

## 2024-08-29 ENCOUNTER — Encounter

## 2024-08-29 ENCOUNTER — Telehealth: Payer: Self-pay

## 2024-08-29 NOTE — Telephone Encounter (Signed)
 Pt viewed Echo results on My Chart per Dr. Vanetta Shawl note. Routed to PCP.

## 2024-09-19 ENCOUNTER — Ambulatory Visit: Attending: Cardiology

## 2024-09-19 DIAGNOSIS — I480 Paroxysmal atrial fibrillation: Secondary | ICD-10-CM | POA: Diagnosis present

## 2024-09-19 DIAGNOSIS — Z7901 Long term (current) use of anticoagulants: Secondary | ICD-10-CM | POA: Insufficient documentation

## 2024-09-19 DIAGNOSIS — I099 Rheumatic heart disease, unspecified: Secondary | ICD-10-CM | POA: Insufficient documentation

## 2024-09-19 DIAGNOSIS — Z5181 Encounter for therapeutic drug level monitoring: Secondary | ICD-10-CM | POA: Diagnosis present

## 2024-09-19 LAB — POCT INR: INR: 3.2 — AB (ref 2.0–3.0)

## 2024-09-19 NOTE — Patient Instructions (Signed)
 Continue taking 1.5 tablets Daily, except 2 tablets every Tuesday and Thursday. Recheck in 6 weeks.  Call office with any medication changes or additions [phone]

## 2024-09-20 LAB — LIPID PANEL
Chol/HDL Ratio: 7 ratio — ABNORMAL HIGH (ref 0.0–4.4)
Cholesterol, Total: 225 mg/dL — ABNORMAL HIGH (ref 100–199)
HDL: 32 mg/dL — ABNORMAL LOW (ref >39)
LDL Chol Calc (NIH): 139 mg/dL — ABNORMAL HIGH (ref 0–99)
Triglycerides: 299 mg/dL — ABNORMAL HIGH (ref 0–149)
VLDL Cholesterol Cal: 54 mg/dL — ABNORMAL HIGH (ref 5–40)

## 2024-09-20 LAB — ALT: ALT: 45 IU/L — ABNORMAL HIGH (ref 0–32)

## 2024-09-20 LAB — AST: AST: 40 IU/L (ref 0–40)

## 2024-10-10 ENCOUNTER — Telehealth: Payer: Self-pay

## 2024-10-10 NOTE — Telephone Encounter (Signed)
 Pt viewed lab results on My Chart per Dr. Karry note. Routed to PCP.

## 2024-10-10 NOTE — Telephone Encounter (Signed)
-----   Message from Lamar Fitch, MD sent at 09/29/2024  9:23 AM EST ----- Cholesterol is not well-controlled.  Please refer her to lipid clinic to talk about options ----- Message ----- From: Interface, [facility] Lab Results In Sent: 09/20/2024   5:38 AM EST To: Lamar JINNY Fitch, MD

## 2024-10-10 NOTE — Addendum Note (Signed)
 Addended by: ONEITA BERLINER on: 10/10/2024 07:43 AM   Modules accepted: Orders

## 2024-10-31 ENCOUNTER — Ambulatory Visit: Attending: Cardiology

## 2024-10-31 DIAGNOSIS — Z5181 Encounter for therapeutic drug level monitoring: Secondary | ICD-10-CM | POA: Insufficient documentation

## 2024-10-31 DIAGNOSIS — I099 Rheumatic heart disease, unspecified: Secondary | ICD-10-CM | POA: Diagnosis present

## 2024-10-31 DIAGNOSIS — I480 Paroxysmal atrial fibrillation: Secondary | ICD-10-CM | POA: Diagnosis present

## 2024-10-31 DIAGNOSIS — Z7901 Long term (current) use of anticoagulants: Secondary | ICD-10-CM | POA: Diagnosis present

## 2024-10-31 LAB — POCT INR: INR: 1.9 — AB (ref 2.0–3.0)

## 2024-10-31 NOTE — Patient Instructions (Signed)
 Take 3 tablets today only then Continue taking 1.5 tablets Daily, except 2 tablets every Tuesday and Thursday. Recheck in 7 weeks.  Call office with any medication changes or additions [phone]

## 2024-11-22 ENCOUNTER — Encounter: Payer: Self-pay | Admitting: Pharmacist Clinician (PhC)/ Clinical Pharmacy Specialist

## 2024-11-22 ENCOUNTER — Ambulatory Visit: Attending: Cardiology | Admitting: Pharmacist Clinician (PhC)/ Clinical Pharmacy Specialist

## 2024-11-22 DIAGNOSIS — G720 Drug-induced myopathy: Secondary | ICD-10-CM | POA: Insufficient documentation

## 2024-11-22 DIAGNOSIS — T466X5A Adverse effect of antihyperlipidemic and antiarteriosclerotic drugs, initial encounter: Secondary | ICD-10-CM | POA: Insufficient documentation

## 2024-11-22 DIAGNOSIS — E782 Mixed hyperlipidemia: Secondary | ICD-10-CM | POA: Diagnosis not present

## 2024-11-22 NOTE — Patient Instructions (Signed)
 Your Results:             Your most recent labs Goal  Total Cholesterol 225 < 200  Triglycerides 299 < 150  HDL (happy/good cholesterol) 32 > 40  LDL (lousy/bad cholesterol 139 < 70   Medication changes:  Please reach out to me when you decide which is better for you.    Patient Assistance:  (for Praluent if we choose this)   We will sign you up for a [employer]well Grant once your medication is approved by [employer].  I will call you with the ID number, then you will take this information to the pharmacy.  They will bill it after your insurance, bringing your copay to $0.  The grant will pay the first $2,500 in a one year period.    ID   BIN [id number]  PCN [id number]  GRP [id number]    Thank you for choosing [facility]    Repatha (evolocumab) Praluent (alirocumab)  Leqvio (inclisrin)   How is it given? Single use injectable pen you would use at home  An injection done by a healthcare professional in-clinic  How often do you take it? Once every 2 weeks 1st and 2nd dose 3 months apart, then every 6 months   White kind of side effects may you experience? -Flu-like symptoms for 36-48 hours following a dose, typically only for the first few doses -Injection site reactions -Bronchitis-like symptoms for 36-48 hours following a dose, typically only for the first few doses -Injection site reactions  How much will this lower your LDL? Are there other benefits? LDL reduction of up to 50-70% Also, this medication has shown that it can help to lower plaque burden when taken with a statin LDL reduction of up to 45-60%   How much will this cost? Comparable, however, there are HealthWell grants and manufacture copay cards that can help reduce the price Comparable, however, there are [employer] grants and manufacture copay cards that can help reduce the price

## 2024-11-22 NOTE — Assessment & Plan Note (Signed)
 Assessment: Patient with CAD not at LDL goal of < 70 Most recent LDL 139 on 09/19/24 Has been compliant with ezetimibe  10 mg Not able to tolerate statins secondary to myalgias Reviewed options for lowering LDL cholesterol, including PCSK-9 inhibitors and inclisiran.  Discussed mechanisms of action, dosing, side effects, potential decreases in LDL cholesterol and costs.  Also reviewed potential options for patient assistance.  Plan: Patient would like to think about options and talk over with a friend who is on Repatha Repeat labs after:  3-4 months Lipid Liver function If chooses Praluent, would sign up for [employer]well [place] due to high costs Income < $72,000 (single)

## 2024-11-22 NOTE — Progress Notes (Signed)
 "  Office Visit    Patient Name: Melody Torres Date of Encounter: 11/22/2024  Primary Care Provider:  Street, Lonni HERO, MD Primary Cardiologist:  Lamar Fitch, MD  Chief Complaint    Hyperlipidemia   Significant Past Medical History   CAD 40% LAD stenoisi  AF Paroxysmal, CHADS2VASc = 4  DM2 10/27/24 A1c 7.4     Allergies[1]  History of Present Illness    Melody Torres is a 69 y.o. female patient of Dr Barbara, in the office today to discuss options for cholesterol management.  She is statin-intolerant, having tried at least 3 medications.  She took bempedoic acid  for about a month, didn't have any specific problems with it, but labs drawn after that month showed an LDL drop of less than 10%.   She continues to take ezetimibe  without any problems.    Insurance Carrier: [employer] Plan F/G    Healthwell:  yes, if chooses Praluent    LDL Cholesterol goal:  LDL < 70  Current Medications:   bempedoic acid , ezetimibe  (separate tablets)  Previously tried:  rosuvastatin , pravastatin , atorvastatin  - myalgias  Family Hx:  father CABG x 5, died from stroke later late 21's.  Mother passed from MI in her 13's; sister no heart disease (AF only); 2 children, no known cholesterol issues  Social Hx: Tobacco: no Alcohol: rarely  Diet:   mix of home and out, more sit down only occasional fast food; home variety of proteins, vegetables mix of fresh/frozen and canned; some desserts, but has cut back (peanut butter crackers, salsa/chips, popcorn)  Exercise:  3 days per week Senior strength and stretch  Accessory Clinical Findings     Lab Results  Component Value Date   CHOL 225 (H) 09/19/2024   HDL 32 (L) 09/19/2024   LDLCALC 139 (H) 09/19/2024   TRIG 299 (H) 09/19/2024   CHOLHDL 7.0 (H) 09/19/2024    No results found for: LIPOA  Lab Results  Component Value Date   ALT 45 (H) 09/19/2024   AST 40 09/19/2024   ALKPHOS 51 06/06/2020   BILITOT 0.5 06/06/2020   Lab  Results  Component Value Date   CREATININE 0.83 06/06/2020   BUN 19 06/06/2020   NA 142 06/06/2020   K 4.1 06/06/2020   CL 103 06/06/2020   CO2 33 (H) 06/06/2020   Lab Results  Component Value Date   HGBA1C 5.6 03/08/2020    Home Medications    Current Outpatient Medications  Medication Sig Dispense Refill   dapagliflozin propanediol (FARXIGA) 5 MG TABS tablet Take 5 mg by mouth daily.     acetaminophen  (TYLENOL ) 500 MG tablet Take 500 mg by mouth as needed for moderate pain or headache.      aspirin  EC 81 MG EC tablet Take 1 tablet (81 mg total) by mouth daily.     Bempedoic Acid  (NEXLETOL ) 180 MG TABS Take 1 tablet (180 mg total) by mouth daily in the afternoon.     clobetasol cream (TEMOVATE) 0.05 % Apply 1 application topically 2 (two) times daily as needed (for lichen sclerosus).      ezetimibe  (ZETIA ) 10 MG tablet Take 1 tablet (10 mg total) by mouth daily. 90 tablet 3   loratadine (CLARITIN) 10 MG tablet Take 10 mg by mouth every other day.      Multiple Vitamin (MULTIVITAMIN) tablet Take 1 tablet by mouth daily. Unknown strenght     UNABLE TO FIND Take 600 mg by mouth daily. Med Name: Chromate  Pure Vitamin/ Unknown strength     warfarin (COUMADIN ) 5 MG tablet TAKE 1 AND 1/2 TO 2 TABLETS BY MOUTH DAILY AS DIRECTED BY THE COUMADIN  CLINIC 180 tablet 0   No current facility-administered medications for this visit.     Assessment & Plan    Hyperlipidemia Assessment: Patient with CAD not at LDL goal of < 70 Most recent LDL 139 on 09/19/24 Has been compliant with ezetimibe  10 mg Not able to tolerate statins secondary to myalgias Reviewed options for lowering LDL cholesterol, including PCSK-9 inhibitors and inclisiran.  Discussed mechanisms of action, dosing, side effects, potential decreases in LDL cholesterol and costs.  Also reviewed potential options for patient assistance.  Plan: Patient would like to think about options and talk over with a friend who is on  Repatha Repeat labs after:  3-4 months Lipid Liver function If chooses Praluent, would sign up for [employer] Grant due to high costs Income < $72,000 (single)    Allean Mink, PharmD CPP [facility] [address]   [place], [place] [address] [phone]  11/22/2024, 4:04 PM       [1]  Allergies Allergen Reactions   Adhesive [Tape] Other (See Comments)    Skin irritation   Pravastatin  Sodium Other (See Comments)   Rosuvastatin  Other (See Comments)    Muscle/ joint pain   Penicillins Rash and Other (See Comments)    Did it involve swelling of the face/tongue/throat, SOB, or low BP? No Did it involve sudden or severe rash/hives, skin peeling, or any reaction on the inside of your mouth or nose? No Did you need to seek medical attention at a hospital or doctor's office? No When did it last happen?      childhood allergy If all above answers are NO, may proceed with cephalosporin use.    "

## 2024-12-19 ENCOUNTER — Ambulatory Visit: Admitting: Cardiology

## 2024-12-19 ENCOUNTER — Ambulatory Visit
